# Patient Record
Sex: Male | Born: 1945 | Race: White | Hispanic: No | Marital: Married | Smoking: Former smoker
Health system: Southern US, Community
[De-identification: ages and names within clinical notes are randomized; demographics above are authoritative.]

## PROBLEM LIST (undated history)

## (undated) DIAGNOSIS — Z860100 Personal history of colon polyps, unspecified: Secondary | ICD-10-CM

## (undated) DIAGNOSIS — E7800 Pure hypercholesterolemia, unspecified: Secondary | ICD-10-CM

## (undated) DIAGNOSIS — I739 Peripheral vascular disease, unspecified: Secondary | ICD-10-CM

## (undated) DIAGNOSIS — K589 Irritable bowel syndrome without diarrhea: Secondary | ICD-10-CM

## (undated) DIAGNOSIS — G4733 Obstructive sleep apnea (adult) (pediatric): Secondary | ICD-10-CM

## (undated) DIAGNOSIS — W57XXXA Bitten or stung by nonvenomous insect and other nonvenomous arthropods, initial encounter: Secondary | ICD-10-CM

## (undated) DIAGNOSIS — J45909 Unspecified asthma, uncomplicated: Secondary | ICD-10-CM

## (undated) DIAGNOSIS — E119 Type 2 diabetes mellitus without complications: Secondary | ICD-10-CM

## (undated) DIAGNOSIS — I10 Essential (primary) hypertension: Secondary | ICD-10-CM

## (undated) DIAGNOSIS — N400 Enlarged prostate without lower urinary tract symptoms: Secondary | ICD-10-CM

## (undated) DIAGNOSIS — H9190 Unspecified hearing loss, unspecified ear: Secondary | ICD-10-CM

## (undated) DIAGNOSIS — Z125 Encounter for screening for malignant neoplasm of prostate: Secondary | ICD-10-CM

## (undated) DIAGNOSIS — K219 Gastro-esophageal reflux disease without esophagitis: Secondary | ICD-10-CM

## (undated) DIAGNOSIS — G459 Transient cerebral ischemic attack, unspecified: Secondary | ICD-10-CM

## (undated) DIAGNOSIS — R7303 Prediabetes: Secondary | ICD-10-CM

## (undated) DIAGNOSIS — Z9989 Dependence on other enabling machines and devices: Secondary | ICD-10-CM

## (undated) DIAGNOSIS — N182 Chronic kidney disease, stage 2 (mild): Secondary | ICD-10-CM

## (undated) DIAGNOSIS — I2510 Atherosclerotic heart disease of native coronary artery without angina pectoris: Secondary | ICD-10-CM

## (undated) DIAGNOSIS — D751 Secondary polycythemia: Secondary | ICD-10-CM

## (undated) DIAGNOSIS — I4821 Permanent atrial fibrillation: Secondary | ICD-10-CM

## (undated) DIAGNOSIS — M419 Scoliosis, unspecified: Secondary | ICD-10-CM

## (undated) DIAGNOSIS — E349 Endocrine disorder, unspecified: Secondary | ICD-10-CM

## (undated) DIAGNOSIS — H269 Unspecified cataract: Secondary | ICD-10-CM

## (undated) HISTORY — DX: Unspecified hearing loss, unspecified ear: H91.90

## (undated) HISTORY — DX: Dependence on other enabling machines and devices: Z99.89

## (undated) HISTORY — PX: SKIN CANCER EXCISION: SHX779

## (undated) HISTORY — DX: Unspecified asthma, uncomplicated: J45.909

## (undated) HISTORY — DX: Personal history of colonic polyps: Z86.010

## (undated) HISTORY — PX: COLONOSCOPY W/ POLYPECTOMY: SHX1380

## (undated) HISTORY — DX: Obstructive sleep apnea (adult) (pediatric): G47.33

## (undated) HISTORY — DX: Personal history of colon polyps, unspecified: Z86.0100

## (undated) HISTORY — DX: Prediabetes: R73.03

## (undated) HISTORY — DX: Chronic kidney disease, stage 2 (mild): N18.2

## (undated) HISTORY — DX: Type 2 diabetes mellitus without complications: E11.9

## (undated) HISTORY — DX: Scoliosis, unspecified: M41.9

## (undated) HISTORY — DX: Pure hypercholesterolemia, unspecified: E78.00

## (undated) HISTORY — DX: Irritable bowel syndrome, unspecified: K58.9

## (undated) HISTORY — DX: Transient cerebral ischemic attack, unspecified: G45.9

## (undated) HISTORY — PX: CARDIAC CATHETERIZATION: SHX172

## (undated) HISTORY — PX: NASAL POLYP SURGERY: SHX186

## (undated) HISTORY — DX: Secondary polycythemia: D75.1

## (undated) HISTORY — PX: CIRCUMCISION: SUR203

## (undated) HISTORY — DX: Endocrine disorder, unspecified: E34.9

## (undated) HISTORY — DX: Benign prostatic hyperplasia without lower urinary tract symptoms: N40.0

## (undated) HISTORY — DX: Atherosclerotic heart disease of native coronary artery without angina pectoris: I25.10

## (undated) HISTORY — DX: Unspecified cataract: H26.9

## (undated) HISTORY — DX: Gastro-esophageal reflux disease without esophagitis: K21.9

## (undated) HISTORY — DX: Encounter for screening for malignant neoplasm of prostate: Z12.5

## (undated) HISTORY — DX: Permanent atrial fibrillation: I48.21

## (undated) HISTORY — DX: Bitten or stung by nonvenomous insect and other nonvenomous arthropods, initial encounter: W57.XXXA

## (undated) HISTORY — DX: Essential (primary) hypertension: I10

## (undated) HISTORY — DX: Peripheral vascular disease, unspecified: I73.9

---

## 1981-08-28 HISTORY — PX: VASECTOMY: SHX75

## 1999-08-20 ENCOUNTER — Inpatient Hospital Stay (HOSPITAL_COMMUNITY): Admission: EM | Admit: 1999-08-20 | Discharge: 1999-08-23 | Payer: Self-pay | Admitting: Emergency Medicine

## 1999-08-20 ENCOUNTER — Encounter: Payer: Self-pay | Admitting: Emergency Medicine

## 1999-08-22 ENCOUNTER — Encounter: Payer: Self-pay | Admitting: Cardiology

## 1999-08-29 DIAGNOSIS — I4821 Permanent atrial fibrillation: Secondary | ICD-10-CM

## 1999-08-29 HISTORY — DX: Permanent atrial fibrillation: I48.21

## 2004-10-18 ENCOUNTER — Ambulatory Visit: Payer: Self-pay

## 2004-10-19 ENCOUNTER — Ambulatory Visit: Payer: Self-pay | Admitting: Pulmonary Disease

## 2004-10-28 ENCOUNTER — Ambulatory Visit: Payer: Self-pay | Admitting: Cardiology

## 2004-11-02 ENCOUNTER — Ambulatory Visit: Payer: Self-pay

## 2004-11-03 ENCOUNTER — Ambulatory Visit: Payer: Self-pay | Admitting: Pulmonary Disease

## 2004-11-03 ENCOUNTER — Ambulatory Visit: Payer: Self-pay | Admitting: *Deleted

## 2004-11-11 ENCOUNTER — Ambulatory Visit: Payer: Self-pay | Admitting: Internal Medicine

## 2004-11-23 ENCOUNTER — Ambulatory Visit: Payer: Self-pay | Admitting: *Deleted

## 2004-12-01 ENCOUNTER — Ambulatory Visit: Payer: Self-pay | Admitting: Internal Medicine

## 2004-12-07 ENCOUNTER — Ambulatory Visit: Payer: Self-pay | Admitting: Cardiology

## 2004-12-14 ENCOUNTER — Ambulatory Visit: Payer: Self-pay | Admitting: Cardiology

## 2004-12-14 ENCOUNTER — Ambulatory Visit: Payer: Self-pay | Admitting: Internal Medicine

## 2005-01-02 ENCOUNTER — Ambulatory Visit: Payer: Self-pay | Admitting: Cardiology

## 2005-01-06 ENCOUNTER — Ambulatory Visit (HOSPITAL_COMMUNITY): Admission: RE | Admit: 2005-01-06 | Discharge: 2005-01-06 | Payer: Self-pay | Admitting: Cardiology

## 2005-01-13 ENCOUNTER — Ambulatory Visit: Payer: Self-pay | Admitting: Cardiovascular Disease

## 2005-10-30 ENCOUNTER — Ambulatory Visit: Payer: Self-pay | Admitting: Pulmonary Disease

## 2006-11-01 ENCOUNTER — Ambulatory Visit: Payer: Self-pay | Admitting: Pulmonary Disease

## 2006-11-01 LAB — CONVERTED CEMR LAB
ALT: 22 units/L (ref 0–40)
AST: 24 units/L (ref 0–37)
Albumin: 4.1 g/dL (ref 3.5–5.2)
Alkaline Phosphatase: 57 units/L (ref 39–117)
BUN: 18 mg/dL (ref 6–23)
Basophils Absolute: 0 10*3/uL (ref 0.0–0.1)
Basophils Relative: 0 % (ref 0.0–1.0)
Bilirubin Urine: NEGATIVE
Bilirubin, Direct: 0.3 mg/dL (ref 0.0–0.3)
CO2: 31 meq/L (ref 19–32)
Calcium: 9.4 mg/dL (ref 8.4–10.5)
Chloride: 105 meq/L (ref 96–112)
Cholesterol: 200 mg/dL (ref 0–200)
Creatinine, Ser: 1.1 mg/dL (ref 0.4–1.5)
Eosinophils Absolute: 0.1 10*3/uL (ref 0.0–0.6)
Eosinophils Relative: 1.5 % (ref 0.0–5.0)
GFR calc Af Amer: 88 mL/min
GFR calc non Af Amer: 72 mL/min
Glucose, Bld: 109 mg/dL — ABNORMAL HIGH (ref 70–99)
HCT: 50.5 % (ref 39.0–52.0)
HDL: 38 mg/dL — ABNORMAL LOW (ref >39.0)
Hemoglobin, Urine: NEGATIVE
Hemoglobin: 17.6 g/dL — ABNORMAL HIGH (ref 13.0–17.0)
Ketones, ur: NEGATIVE mg/dL
LDL Cholesterol: 135 mg/dL — ABNORMAL HIGH (ref 0–99)
Leukocytes, UA: NEGATIVE
Lymphocytes Relative: 31.5 % (ref 12.0–46.0)
MCHC: 34.9 g/dL (ref 30.0–36.0)
MCV: 89 fL (ref 78.0–100.0)
Monocytes Absolute: 0.4 10*3/uL (ref 0.2–0.7)
Monocytes Relative: 7.8 % (ref 3.0–11.0)
Neutro Abs: 3.1 10*3/uL (ref 1.4–7.7)
Neutrophils Relative %: 59.2 % (ref 43.0–77.0)
Nitrite: NEGATIVE
PSA: 1.86 ng/mL (ref 0.10–4.00)
Platelets: 160 10*3/uL (ref 150–400)
Potassium: 4.2 meq/L (ref 3.5–5.1)
RBC: 5.68 M/uL (ref 4.22–5.81)
RDW: 12.8 % (ref 11.5–14.6)
Sodium: 142 meq/L (ref 135–145)
Specific Gravity, Urine: 1.02 (ref 1.000–1.03)
TSH: 0.83 microintl units/mL (ref 0.35–5.50)
Testosterone: 914.32 ng/dL — ABNORMAL HIGH (ref 350.00–890)
Total Bilirubin: 1.6 mg/dL — ABNORMAL HIGH (ref 0.3–1.2)
Total CHOL/HDL Ratio: 5.3
Total Protein, Urine: NEGATIVE mg/dL
Total Protein: 7 g/dL (ref 6.0–8.3)
Triglycerides: 137 mg/dL (ref 0–149)
Urine Glucose: NEGATIVE mg/dL
Urobilinogen, UA: 0.2 (ref 0.0–1.0)
VLDL: 27 mg/dL (ref 0–40)
WBC: 5.2 10*3/uL (ref 4.5–10.5)
pH: 5.5 (ref 5.0–8.0)

## 2007-08-13 ENCOUNTER — Telehealth: Payer: Self-pay | Admitting: Pulmonary Disease

## 2007-08-13 DIAGNOSIS — D126 Benign neoplasm of colon, unspecified: Secondary | ICD-10-CM

## 2007-08-13 DIAGNOSIS — K589 Irritable bowel syndrome without diarrhea: Secondary | ICD-10-CM | POA: Insufficient documentation

## 2007-08-13 DIAGNOSIS — M4120 Other idiopathic scoliosis, site unspecified: Secondary | ICD-10-CM | POA: Insufficient documentation

## 2007-08-13 DIAGNOSIS — Z87898 Personal history of other specified conditions: Secondary | ICD-10-CM

## 2007-08-13 DIAGNOSIS — K219 Gastro-esophageal reflux disease without esophagitis: Secondary | ICD-10-CM | POA: Insufficient documentation

## 2007-08-13 DIAGNOSIS — I739 Peripheral vascular disease, unspecified: Secondary | ICD-10-CM | POA: Insufficient documentation

## 2007-08-13 DIAGNOSIS — E7800 Pure hypercholesterolemia, unspecified: Secondary | ICD-10-CM | POA: Insufficient documentation

## 2007-08-14 ENCOUNTER — Ambulatory Visit: Payer: Self-pay | Admitting: Pulmonary Disease

## 2007-10-17 ENCOUNTER — Telehealth (INDEPENDENT_AMBULATORY_CARE_PROVIDER_SITE_OTHER): Payer: Self-pay | Admitting: *Deleted

## 2007-10-17 ENCOUNTER — Ambulatory Visit: Payer: Self-pay | Admitting: Pulmonary Disease

## 2007-10-17 DIAGNOSIS — J45909 Unspecified asthma, uncomplicated: Secondary | ICD-10-CM | POA: Insufficient documentation

## 2007-10-17 DIAGNOSIS — E291 Testicular hypofunction: Secondary | ICD-10-CM

## 2007-10-18 ENCOUNTER — Telehealth (INDEPENDENT_AMBULATORY_CARE_PROVIDER_SITE_OTHER): Payer: Self-pay | Admitting: *Deleted

## 2007-10-23 ENCOUNTER — Encounter: Payer: Self-pay | Admitting: Pulmonary Disease

## 2007-11-08 ENCOUNTER — Ambulatory Visit: Payer: Self-pay | Admitting: Pulmonary Disease

## 2007-11-09 DIAGNOSIS — I10 Essential (primary) hypertension: Secondary | ICD-10-CM | POA: Insufficient documentation

## 2007-11-09 DIAGNOSIS — I2510 Atherosclerotic heart disease of native coronary artery without angina pectoris: Secondary | ICD-10-CM | POA: Insufficient documentation

## 2007-11-28 ENCOUNTER — Encounter: Payer: Self-pay | Admitting: Pulmonary Disease

## 2007-12-02 ENCOUNTER — Encounter: Payer: Self-pay | Admitting: Pulmonary Disease

## 2008-05-05 ENCOUNTER — Encounter: Payer: Self-pay | Admitting: Pulmonary Disease

## 2008-09-08 ENCOUNTER — Encounter: Payer: Self-pay | Admitting: Pulmonary Disease

## 2008-09-30 ENCOUNTER — Telehealth (INDEPENDENT_AMBULATORY_CARE_PROVIDER_SITE_OTHER): Payer: Self-pay | Admitting: *Deleted

## 2008-10-15 ENCOUNTER — Ambulatory Visit: Payer: Self-pay | Admitting: Pulmonary Disease

## 2008-12-21 ENCOUNTER — Encounter: Payer: Self-pay | Admitting: Pulmonary Disease

## 2009-01-12 ENCOUNTER — Encounter: Payer: Self-pay | Admitting: Pulmonary Disease

## 2009-05-25 ENCOUNTER — Encounter: Payer: Self-pay | Admitting: Pulmonary Disease

## 2009-07-13 ENCOUNTER — Ambulatory Visit: Payer: Self-pay | Admitting: Pulmonary Disease

## 2009-07-25 LAB — CONVERTED CEMR LAB
ALT: 28 units/L (ref 0–53)
AST: 24 units/L (ref 0–37)
Albumin: 4.2 g/dL (ref 3.5–5.2)
Alkaline Phosphatase: 54 units/L (ref 39–117)
BUN: 17 mg/dL (ref 6–23)
Basophils Absolute: 0 10*3/uL (ref 0.0–0.1)
Basophils Relative: 0.6 % (ref 0.0–3.0)
Bilirubin, Direct: 0.2 mg/dL (ref 0.0–0.3)
CO2: 32 meq/L (ref 19–32)
Calcium: 9.3 mg/dL (ref 8.4–10.5)
Chloride: 100 meq/L (ref 96–112)
Cholesterol: 211 mg/dL — ABNORMAL HIGH (ref 0–200)
Creatinine, Ser: 1.1 mg/dL (ref 0.4–1.5)
Direct LDL: 122.8 mg/dL
Eosinophils Absolute: 0.2 10*3/uL (ref 0.0–0.7)
Eosinophils Relative: 2.9 % (ref 0.0–5.0)
GFR calc non Af Amer: 71.64 mL/min (ref >60)
Glucose, Bld: 97 mg/dL (ref 70–99)
HCT: 52 % (ref 39.0–52.0)
HDL: 39.2 mg/dL (ref >39.00)
Hemoglobin: 18.1 g/dL — ABNORMAL HIGH (ref 13.0–17.0)
Iron: 144 ug/dL (ref 42–165)
Lymphocytes Relative: 34 % (ref 12.0–46.0)
Lymphs Abs: 2.3 10*3/uL (ref 0.7–4.0)
MCHC: 34.8 g/dL (ref 30.0–36.0)
MCV: 93.2 fL (ref 78.0–100.0)
Monocytes Absolute: 0.5 10*3/uL (ref 0.1–1.0)
Monocytes Relative: 7.4 % (ref 3.0–12.0)
Neutro Abs: 3.8 10*3/uL (ref 1.4–7.7)
Neutrophils Relative %: 55.1 % (ref 43.0–77.0)
PSA: 2.02 ng/mL (ref 0.10–4.00)
Platelets: 163 10*3/uL (ref 150.0–400.0)
Potassium: 4.2 meq/L (ref 3.5–5.1)
RBC: 5.57 M/uL (ref 4.22–5.81)
RDW: 12.5 % (ref 11.5–14.6)
Saturation Ratios: 42.4 % (ref 20.0–50.0)
Sodium: 141 meq/L (ref 135–145)
TSH: 0.83 microintl units/mL (ref 0.35–5.50)
Testosterone: 712.52 ng/dL (ref 350.00–890.00)
Total Bilirubin: 1.5 mg/dL — ABNORMAL HIGH (ref 0.3–1.2)
Total CHOL/HDL Ratio: 5
Total Protein: 7.3 g/dL (ref 6.0–8.3)
Transferrin: 242.6 mg/dL (ref 212.0–360.0)
Triglycerides: 235 mg/dL — ABNORMAL HIGH (ref 0.0–149.0)
Uric Acid, Serum: 8.3 mg/dL — ABNORMAL HIGH (ref 4.0–7.8)
VLDL: 47 mg/dL — ABNORMAL HIGH (ref 0.0–40.0)
WBC: 6.8 10*3/uL (ref 4.5–10.5)

## 2009-08-24 ENCOUNTER — Telehealth (INDEPENDENT_AMBULATORY_CARE_PROVIDER_SITE_OTHER): Payer: Self-pay | Admitting: *Deleted

## 2009-09-09 ENCOUNTER — Telehealth (INDEPENDENT_AMBULATORY_CARE_PROVIDER_SITE_OTHER): Payer: Self-pay | Admitting: *Deleted

## 2010-01-03 ENCOUNTER — Encounter: Payer: Self-pay | Admitting: Pulmonary Disease

## 2010-03-18 ENCOUNTER — Telehealth: Payer: Self-pay | Admitting: Pulmonary Disease

## 2010-08-11 ENCOUNTER — Ambulatory Visit: Payer: Self-pay | Admitting: Pulmonary Disease

## 2010-08-11 ENCOUNTER — Encounter: Payer: Self-pay | Admitting: Pulmonary Disease

## 2010-08-15 LAB — CONVERTED CEMR LAB
ALT: 23 units/L (ref 0–53)
AST: 26 units/L (ref 0–37)
Albumin: 4.3 g/dL (ref 3.5–5.2)
Alkaline Phosphatase: 56 units/L (ref 39–117)
BUN: 16 mg/dL (ref 6–23)
Basophils Absolute: 0 10*3/uL (ref 0.0–0.1)
CO2: 32 meq/L (ref 19–32)
Chloride: 101 meq/L (ref 96–112)
Cholesterol: 214 mg/dL — ABNORMAL HIGH (ref 0–200)
Direct LDL: 117.5 mg/dL
Glucose, Bld: 97 mg/dL (ref 70–99)
HCT: 52 % (ref 39.0–52.0)
Lymphocytes Relative: 32.3 % (ref 12.0–46.0)
Lymphs Abs: 1.9 10*3/uL (ref 0.7–4.0)
Monocytes Relative: 8.7 % (ref 3.0–12.0)
Neutrophils Relative %: 56.3 % (ref 43.0–77.0)
Platelets: 180 10*3/uL (ref 150.0–400.0)
Potassium: 4.4 meq/L (ref 3.5–5.1)
RDW: 12.9 % (ref 11.5–14.6)
Sodium: 141 meq/L (ref 135–145)
Testosterone: 494.7 ng/dL (ref 350.00–890.00)
Total Protein: 7.2 g/dL (ref 6.0–8.3)
Transferrin: 290.1 mg/dL (ref 212.0–360.0)
Triglycerides: 240 mg/dL — ABNORMAL HIGH (ref 0.0–149.0)
VLDL: 48 mg/dL — ABNORMAL HIGH (ref 0.0–40.0)
WBC: 6 10*3/uL (ref 4.5–10.5)

## 2010-09-25 LAB — CONVERTED CEMR LAB
ALT: 27 units/L (ref 0–53)
AST: 26 units/L (ref 0–37)
Albumin: 4.3 g/dL (ref 3.5–5.2)
Alkaline Phosphatase: 59 units/L (ref 39–117)
BUN: 12 mg/dL (ref 6–23)
Bacteria, UA: NEGATIVE
Basophils Absolute: 0.1 10*3/uL (ref 0.0–0.1)
Basophils Relative: 0.9 % (ref 0.0–1.0)
Bilirubin Urine: NEGATIVE
Bilirubin, Direct: 0.2 mg/dL (ref 0.0–0.3)
CO2: 31 meq/L (ref 19–32)
Calcium: 9.4 mg/dL (ref 8.4–10.5)
Chloride: 105 meq/L (ref 96–112)
Cholesterol: 226 mg/dL (ref 0–200)
Creatinine, Ser: 1.1 mg/dL (ref 0.4–1.5)
Crystals: NEGATIVE
Eosinophils Absolute: 0.1 10*3/uL (ref 0.0–0.6)
Eosinophils Relative: 1.8 % (ref 0.0–5.0)
GFR calc Af Amer: 87 mL/min
GFR calc non Af Amer: 72 mL/min
Glucose, Bld: 115 mg/dL — ABNORMAL HIGH (ref 70–99)
HCT: 54.6 % — ABNORMAL HIGH (ref 39.0–52.0)
HDL: 41.9 mg/dL (ref >39.0)
Hemoglobin, Urine: NEGATIVE
Hemoglobin: 18.5 g/dL — ABNORMAL HIGH (ref 13.0–17.0)
Ketones, ur: NEGATIVE mg/dL
LDL Cholesterol: 157 mg/dL — ABNORMAL HIGH (ref 0–99)
Leukocytes, UA: NEGATIVE
Lymphocytes Relative: 26.4 % (ref 12.0–46.0)
MCHC: 33.8 g/dL (ref 30.0–36.0)
MCV: 90.3 fL (ref 78.0–100.0)
Monocytes Absolute: 0.4 10*3/uL (ref 0.2–0.7)
Monocytes Relative: 7.9 % (ref 3.0–11.0)
Neutro Abs: 3.5 10*3/uL (ref 1.4–7.7)
Neutrophils Relative %: 63 % (ref 43.0–77.0)
Nitrite: NEGATIVE
PSA: 2.24 ng/mL (ref 0.10–4.00)
Platelets: 157 10*3/uL (ref 150–400)
Potassium: 3.9 meq/L (ref 3.5–5.1)
RBC / HPF: NONE SEEN
RBC: 6.05 M/uL — ABNORMAL HIGH (ref 4.22–5.81)
RDW: 12.5 % (ref 11.5–14.6)
Sodium: 142 meq/L (ref 135–145)
Specific Gravity, Urine: 1.025 (ref 1.000–1.03)
Squamous Epithelial / HPF: NEGATIVE /lpf
TSH: 1.22 microintl units/mL (ref 0.35–5.50)
Testosterone: 707.79 ng/dL (ref 350.00–890)
Total Bilirubin: 1.9 mg/dL — ABNORMAL HIGH (ref 0.3–1.2)
Total CHOL/HDL Ratio: 5.4
Total Protein, Urine: NEGATIVE mg/dL
Total Protein: 7.3 g/dL (ref 6.0–8.3)
Triglycerides: 134 mg/dL (ref 0–149)
Urine Glucose: NEGATIVE mg/dL
Urobilinogen, UA: 0.2 (ref 0.0–1.0)
VLDL: 27 mg/dL (ref 0–40)
WBC: 5.6 10*3/uL (ref 4.5–10.5)
pH: 5 (ref 5.0–8.0)

## 2010-09-27 NOTE — Progress Notes (Signed)
Summary: androgel directions clarification - hold for SN's return  Phone Note Call from Patient   Caller: Patient Call For: Wolf Boulay Summary of Call: pt need 3 month supply for androl gel [facility] [phone] Initial call taken by: Rickard Patience,  March 18, 2010 10:09 AM  Follow-up for Phone Call        Spoke with pharmacist- pt just picked up 30 day supply of this on 03/17/10- pt needs appt. LMOMTCB Vernie Murders  March 18, 2010 10:27 AM  pt returned call.  pt states that he normally gets a 90day supply but only got 30days when he picked up rx yesterday.  informed pt that since he just picked it up yesterday, insurance wouldn't cover another supply so soon.  but will call pharmacy to get the dosing for 90days so that the rx can be changed for next time.  pt okay with this and verbalized his understanding.  called spoke with pharmacist at [facility].  she states that pt has been using the androgel 5grams daily provided in 5gm packets (which per pharmacist equals to 4 pumps).  per pt's chart, this was changed to 2-3pumps daily 06/2009.  will hold msg until SN returns to office for clarification on directions and for okay to send new rx for 90days. Boone Master CNA/MA  March 18, 2010 11:17 AM    Additional Follow-up for Phone Call Additional follow up Details #1::        per SN---last level was 700 06/2009 on the 5 pumps 3-4 days weekly---rec changing to 3 pumps daily but we are not sure if he changed the dosing.  this has been called into the pharmacy for the pt for 90 day supply per pts request.  lmovm to make pt aware Randell Loop CMA  March 28, 2010 9:58 AM      New/Updated Medications: ANDROGEL PUMP 1 %  GEL (TESTOSTERONE) apply 3 pumps to skin daily as directed.Marland KitchenMarland Kitchen

## 2010-09-27 NOTE — Letter (Signed)
Summary: [facility]   Imported By: Sherian Rein 01/25/2010 13:46:57  _____________________________________________________________________  External Attachment:    Type:   Image     Comment:   External Document

## 2010-09-27 NOTE — Progress Notes (Signed)
Summary: needs refill  Phone Note Call from Patient Call back at [phone]   Caller: Patient Call For: nadel Reason for Call: Refill Medication Summary of Call: [facility] prescription to [place] on the 5th and they havent heard anything yet. it is a refill for androgel.  phone [phone] Initial call taken by: Valinda Hoar,  September 09, 2009 12:28 PM  Follow-up for Phone Call        refills called to pharmacy--lm for pt this had been done Follow-up by: Philipp Deputy CMA,  September 09, 2009 12:40 PM    Prescriptions: ANDROGEL PUMP 1 %  GEL (TESTOSTERONE) apply 2-3 pumps to skin daily as directed...  #1 x 6   Entered by:   Philipp Deputy CMA   Authorized by:   Michele Mcalpine MD   Signed by:   Philipp Deputy CMA on 09/09/2009   Method used:   Telephoned to ...       [facility]* (retail)       [phone] [address] [place], [place]  [address]       Ph: [phone]       Fax: [phone]   RxID:   [id number]

## 2010-09-29 NOTE — Assessment & Plan Note (Signed)
Summary: 1 YR PHYSICAL ///KP   CC:  Yearly ROV & review of mult medical problems....  History of Present Illness: 65 y/o WM here for a yearly CPX... he has multiple medical problems as noted below...     ~  August 11, 2010:  he reports a good year- no new complaints or concerns (see below)...   Current Problems:   REACTIVE AIRWAY DISEASE (ICD-493.90) - he stopped the Advair & denies any URIs or exac over the last year...  ~  CXR 11/10 showed borderline heart size, clear lungs, NAD.Marland Kitchen.  ~  CXR 12/11 showed borderline cor, NAD...  HYPERTENSION (ICD-401.9) -  followed by DrSimmons at [facility]- on COUMADIN (followed in their clinic), CARTIA XT 180mg Bid, ZIAC 5mg /d, MICARDIS 40mg Qd... last note 5/11 reviewed- stable, needs to lose wt... BP today is 140/80, & weight about the same at 199#... he has not been dieting adeq, or exercising by his admission & we discussed this.  CORONARY ARTERY DISEASE (ICD-414.00) - known non-obstructive CAD on cath 12/00 showing 2 vessel 30% lesions, no signif blockage, EF=60%... rec for med rx, risk factor reduction, diet, exerc...  ~  Stress Test 4/09 at [facility] showed good exerc capacity, normal EF   ~  12/11: denies CP, angina, palpit, SOB, edema... rec to incr exercise, get wt down.  ATRIAL FIBRILLATION (ICD-V12.59) - attempted cardioversion unsuccessful in 2008... on Coumadin followed at [facility]...  ~  EKG 11/10 showed AFib, rate 62/min, NSSTTWA...  ~  EKG 12/11 showed AFib controlled VR, NSSTTWA...  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 now followed by [facility]mons [facility].  HYPERCHOLESTEROLEMIA (ICD-272.0) - intol to Lipitor... currently on CRESTOR 5mg - taking 1/2 tab 3d/wk per [facility].  ~ FLP 3/08 showed TChol 200, TG 137, HDL 38, LDL 135... never tried other statins...  ~ FLP 3/09 showed TChol 226, TG 134, HDL 42, LDL 157... we will start CRESTOR 5mg /d + CoQ 10.  ~  4/09: pt started  seeing the Lipid Clinic at [facility] for labs & meds... he can only tol 2.5mg  Crestor...  ~  FLP 11/10 (on Cres5- 1/2tab) showed TChol 211, TG 235, HDL 39, LDL 123... copy to [facility].  ~  FLP 12/11 on Cres5- 1/2MWF showed TChol 214, TG 240, HDL 44, LDL 118... similar to prev & not at goal.  GERD (ICD-530.81) - on PROTONIX 40mg /d... 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   ~  12/11:  DrOutlaw has moved- he will call [facility] GI Dept to be reassigned & have f/u colon in 2012.  BENIGN PROSTATIC HYPERTROPHY, HX OF (ICD-V13.8) - DRE & PSAs have been normal... 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 on ANDROGEL 5gm/d w/ improvement, and f/u blood levels 300-500 range...   ~  testosterone level = 914 on 3/08 >> instructed to decrease the Androgel to Qod... PSA= 1.86.  ~  f/u labs 3/09 showed level 708 on Qod Androgel... PSA is 2.24  ~  changed to Androgel pump- taking 5 pumps 3-4 days per week...   ~  labs 11/10 showed Testos= 712, PSA= 2.02... rec to change to 2-3 pumps per day.  ~  12/11:  now on Androgel 4pumps Qod & Testos level= 494  SCOLIOSIS (ICD-737.30)  ? of POLYCYTHEMIA (ICD-289.0) - he's had borderline high Hgb/HCT in the past w/ elevated  serum iron levels (293 in 2002)... he was negative for any hemochromatosis genetic markers (C282Y, H63D, S65C)... told to avoid iron supplements and consider blood donation regularly (he has Bneg blood but can't donate now on Coumadin)... his iron level improved to 164 w/ this strategy...  ~  labs 11/10 showed Hg= 18.1, HCT= 52, Fe= 144  ~  labs 12/11 showed Hg= 18.3, Hct= 52, Fe= 118 (29%sat)...  DERMATOLOGY - he is followed at the [facility] & seen every 28mo...   ~  Hx skin ca left shoulder 2008, "they got it all" he says, we don't have records.  ~  12/11:  he reports having  mult skin lesions removed10/11 & Rx w/ CARAC Cream.  HEALTH MAINTENANCE:  ~  GI:  followed by [facility] GI dept> DrOutlaw, last colon 8/07 w/ adenomatous polyp removed.  ~  GU:  Hx BPH & Loe-T on Androgel 4 pumps Qod w/ level = 495.  ~  Immunizations:  he gets the yearly seasonal Flu vaccine... given PNEUMOVAX 12/11, age 75... ?when last Tetanus shot was given... we discussed indication for the Shingles vaccine.   Preventive Screening-Counseling & Management  Alcohol-Tobacco     Smoking Status: quit     Year Quit: 1977     Pack years: 5  Allergies: 1)  ! Lipitor (Atorvastatin Calcium)  Comments:  Nurse/Medical Assistant: The patient's medications and allergies were reviewed with the patient and were updated in the Medication and Allergy Lists.  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 of POLYCYTHEMIA (ICD-289.0)  Past Surgical History: S/P nasal polpy surgery S/P circumcision  Family History: Reviewed history from 07/13/2009 and no changes required. mother deceased age 31 from aneurysm father deceased age 75 from urinic poisoning  hx of DM 1 sibling alive age 21 1 sibling deceased age 51 from liver problems 1 siblign alive age 31 1 sibling alive age 27  Social History: Reviewed history from 07/13/2009 and no changes required. married 2 children quit smoking in 1976--cigars--smoked for 34 years exposed to second hand smoke exercises 2 times weekly caffeine use  2 cups daily no alcohol use quit driniking in 1968 retired  Review of Systems      See HPI  The patient  denies anorexia, fever, weight loss, weight gain, vision loss, decreased hearing, hoarseness, chest pain, syncope, dyspnea on exertion, peripheral edema, prolonged cough, headaches, hemoptysis, abdominal pain, melena, hematochezia, severe indigestion/heartburn, hematuria, incontinence, muscle weakness, suspicious skin lesions, transient blindness, difficulty walking, depression, unusual weight change, abnormal bleeding, enlarged lymph nodes, and angioedema.    Vital Signs:  Patient profile:   65 year old male Height:      66 inches Weight:      198.50 pounds BMI:     32.15 O2 Sat:      96 % on Room air Temp:     97.3 degrees F oral Pulse rate:   89 / minute BP sitting:   140 / 80  (right arm) Cuff size:   regular  Vitals Entered By: Randell Loop CMA (August 11, 2010 8:50 AM)  O2 Sat at Rest %:  96 O2 Flow:  Room air CC: Yearly ROV & review of mult medical problems...   Physical Exam  Additional Exam:  WD,  WN, 65 y/o WM in NAD... GENERAL:  Alert & oriented; pleasant & cooperative... HEENT:  [place]/AT, EOM-wnl, PERRLA, EACs-clear, TMs-wnl, NOSE-clear, THROAT-clear & wnl. NECK:  Supple w/ fairROM; no JVD; normal carotid impulses w/o bruits; no thyromegaly or nodules palpated; no lymphadenopathy. CHEST:  Clear to P & A; without wheezes/ rales/ or rhonchi heard... HEART:  sl irreg, without murmurs/ rubs/ or gallops heard... ABDOMEN:  Soft & nontender; normal bowel sounds; no organomegaly or masses detected. RECTAL:  sl enlarged prostate- smooth w/o nodules, no masses, stool heme neg... EXT: without deformities or arthritic changes; no varicose veins/ venous insuffic/ or edema. NEURO:  CN's intact; no focal neuro deficits... DERM:  Scar left shoulder, no other lesions noted; no rash noted...    MISC. Report  Procedure date:  08/11/2010  Findings:      DATA REVIEWED:  ~  CXR, EKG, Fasting blood work all done today & reviewed w/ pt via the Phone Tree...   Impression &  Recommendations:  Problem # 1:  HYPERTENSION (ICD-401.9) BP controlled>  continue same meds... His updated medication list for this problem includes:    Ziac 5-6.25 Mg Tabs (Bisoprolol-hydrochlorothiazide) .Marland Kitchen... 1 tab daily...    Cardizem Cd 180 Mg Cp24 (Diltiazem hcl coated beads) .Marland Kitchen... Take one tablet two times a day    Micardis 40 Mg Tabs (Telmisartan) .Marland Kitchen... Take 1 tablet by mouth once a day  Orders: T-2 View CXR (71020TC) TLB-BMP (Basic Metabolic Panel-BMET) (80048-METABOL) TLB-Hepatic/Liver Function Pnl (80076-HEPATIC) TLB-CBC Platelet - w/Differential (85025-CBCD) TLB-Lipid Panel (80061-LIPID) TLB-TSH (Thyroid Stimulating Hormone) (84443-TSH) TLB-IBC Pnl (Iron/FE;Transferrin) (83550-IBC) TLB-PSA (Prostate Specific Antigen) (84153-PSA) TLB-Testosterone, Total (84403-TESTO)  Problem # 2:  ATRIAL FIBRILLATION, HX OF (ICD-V12.59) Cardiac followd by [facility]- DrSimmons Q81mo & his notes reviewed...  Problem # 3:  HYPERCHOLESTEROLEMIA (ICD-272.0) Followed by the [facility] on Cres5 but only tol 1/2 MWF... asked to try and incr to 4d/wk... copy sent to [facility]... His updated medication list for this problem includes:    Crestor 5 Mg Tabs (Rosuvastatin calcium) .Marland Kitchen... Take as directed...  Problem # 4:  COLONIC POLYPS (ICD-211.3) GI stable but he is due for a follow up w/ DrOutlaw & he will call to set this up himself...  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 continue same...  Problem # 6:  ? of POLYCYTHEMIA (ICD-289.0) He has mild polycythemia but Fe level is normal etc... can't donate blood (he is B neg) due to coumadin Rx...  Problem # 7:  OTHER MEDICAL ISSUES AS NOTED>>> OK Pneumonia vaccine today...  Complete Medication List: 1)  Coumadin 5 Mg Tabs (Warfarin sodium) .... Take as directed... 2)  Ziac 5-6.25 Mg Tabs (Bisoprolol-hydrochlorothiazide) .Marland Kitchen.. 1 tab daily.Marland KitchenMarland Kitchen 3)  Cardizem Cd 180 Mg Cp24 (Diltiazem hcl coated  beads) .... Take one tablet two times a day 4)  Micardis 40 Mg Tabs (Telmisartan) .... Take 1 tablet by mouth once a day 5)  Crestor 5 Mg Tabs (Rosuvastatin calcium) .... Take as directed... 6)  Protonix 40 Mg Pack (Pantoprazole sodium) .... Take 1 tablet by mouth once a day 7)  Androgel Pump 1 % Gel (Testosterone) .... Apply 3 pumps to skin daily as directed... 8)  Calcium 500 Mg Tabs (Calcium) .... Take one tablet by mouth two times a day 9)  Max Glucosamine Chondroitin 500-400 Mg Tabs (Glucosamine-chondroitin) .... Take 1 tablet by mouth once a day 10)  Mens Multivitamin Plus Tabs (Multiple vitamins-minerals) .... Take 1 tablet  by mouth once a day  Other Orders: EKG w/ Interpretation (93000) Pneumococcal Vaccine (04540) Admin 1st Vaccine (98119) Flu Vaccine 63yrs + (14782) Admin of Any Addtl Vaccine (95621)  Patient Instructions: 1)  Today we updated your med list- see below.... 2)  We refilled your meds as discussed... 3)  Today we did your follow up CXR, EKG, & FASTING blood work... 4)  please call the "phone tree" in a few days for your lab results.Marland KitchenMarland Kitchen  5)  We also gave you the 2011 Flu shot & a PNEUMONIA vaccine (based on current CDC recommendations this is the only Pneumovax you will need)... 6)  Let's get on track w/ our diet & exercise program... 7)  Call for any problems... 8)  Please schedule a follow-up appointment in 1 year. Prescriptions: ANDROGEL PUMP 1 %  GEL (TESTOSTERONE) apply 3 pumps to skin daily as directed...  #3 mo supply x 4   Entered and Authorized by:   Michele Mcalpine MD   Signed by:   Michele Mcalpine MD on 08/11/2010   Method used:   Print then Give to Patient   RxID:   [id number] PROTONIX 40 MG  PACK (PANTOPRAZOLE SODIUM) Take 1 tablet by mouth once a day  #90 x 4   Entered and Authorized by:   Michele Mcalpine MD   Signed by:   Michele Mcalpine MD on 08/11/2010   Method used:   Print then Give to Patient   RxID:   [id number] CRESTOR 5 MG  TABS  (ROSUVASTATIN CALCIUM) take as directed...  #90 x 4   Entered and Authorized by:   Michele Mcalpine MD   Signed by:   Michele Mcalpine MD on 08/11/2010   Method used:   Print then Give to Patient   RxID:   [id number] MICARDIS 40 MG TABS (TELMISARTAN) Take 1 tablet by mouth once a day  #90 x 4   Entered and Authorized by:   Michele Mcalpine MD   Signed by:   Michele Mcalpine MD on 08/11/2010   Method used:   Print then Give to Patient   RxID:   [id number] CARDIZEM CD 180 MG  CP24 (DILTIAZEM HCL COATED BEADS) take one tablet two times a day  #180 x 4   Entered and Authorized by:   Michele Mcalpine MD   Signed by:   Michele Mcalpine MD on 08/11/2010   Method used:   Print then Give to Patient   RxID:   [id number] ZIAC 5-6.25 MG  TABS (BISOPROLOL-HYDROCHLOROTHIAZIDE) 1 tab daily...  #90 x 4   Entered and Authorized by:   Michele Mcalpine MD   Signed by:   Michele Mcalpine MD on 08/11/2010   Method used:   Print then Give to Patient   RxID:   [id number] COUMADIN 5 MG  TABS (WARFARIN SODIUM) Take as directed...  #90 x 4   Entered and Authorized by:   Michele Mcalpine MD   Signed by:   Michele Mcalpine MD on 08/11/2010   Method used:   Print then Give to Patient   RxID:   [id number]    Immunizations Administered:  Pneumonia Vaccine:    Vaccine Type: Pneumovax    Site: right deltoid    Mfr: Merck    Dose: 0.5 ml    Route: IM    Given by: Randell Loop CMA    Exp. Date: 01/05/2012    Lot #: [id number]  VIS given: 08/02/09 version given August 11, 2010.  Influenza Vaccine # 1:    Vaccine Type: Fluvax 3+    Site: left deltoid    Mfr: GlaxoSmithKline    Dose: 0.5 ml    Route: IM    Given by: Randell Loop CMA    Exp. Date: 02/25/2011    Lot #: [id number]    VIS given: 03/22/10 version given August 11, 2010.  Flu Vaccine Consent Questions:    Do you have a history of severe allergic reactions to this vaccine? no    Any prior history of allergic reactions to egg and/or gelatin?  no    Do you have a sensitivity to the preservative Thimersol? no    Do you have a past history of Guillan-Barre Syndrome? no    Do you currently have an acute febrile illness? no    Have you ever had a severe reaction to latex? no    Vaccine information given and explained to patient? yes

## 2011-01-13 NOTE — H&P (Signed)
[facility]  Patient:    Jeffrey Little                       MRN: [id number] Adm. Date:  [id number] Attending:  Corwin Levins CC:         Lonzo Cloud. Kriste Basque, M.D. [facility]                         History and Physical  CHIEF COMPLAINT:  Fluttering of the chest.  HISTORY OF PRESENT ILLNESS:  [name] is a 65 year old white male, who awakened this morning with a fluttering of the chest and slight "uneasiness" on lying flat. Denies any chest pain, shortness of breath, or syncope, or other symptoms.  No apparent history of heart problems.  The wife is a former cardiac nurse at [employer].  She brought him to the emergency room where electrocardiogram showed atrial fibrillation, with heart rates in the 80-90 range.  He is now admitted with new onset atrial fibrillation of less than 24 hours by history.  He incidentally had a stress test and an echocardiogram 10 years ago for benign PVCs with Dr. Arturo Morton. Stuckey.  PAST MEDICAL HISTORY: 1. Hypertension. 2. History of benign PVCs. 3. Allergies. 4. Benign prostatic hypertrophy. 5. Borderline polycythemia.  ALLERGIES:  No known drug allergies.  CURRENT MEDICATIONS: 1. Cardura 4 mg p.o. q.d. 2. Cardizem CD 240 mg p.o. q.d. 3. Ziac unclear dose, but probably 5/6.25 mg p.o. q.d. 4. Baby aspirin 81 mg p.o. q.d. 5. Multivitamin q.d.  SOCIAL HISTORY:  No tobacco, no alcohol.  FAMILY HISTORY:  No heart disease.  REVIEW OF SYSTEMS:  Otherwise noncontributory.  PHYSICAL EXAMINATION:  GENERAL:  [name] is a 65 year old white male who appears comfortable.  VITAL SIGNS:  Blood pressure 133/89, heart rate 93, respirations 16, afebrile.  HEENT:  Sclerae clear.  Tympanic membranes clear.  Pharynx benign.  NECK:  No lymphadenopathy, no jugular venous distention, thyromegaly.  CHEST:  No rales or wheezing.  CARDIAC:  Irregularly irregular, no murmur.  ABDOMEN:  Soft, nontender.  Positive  bowel sounds.  EXTREMITIES:  No edema.  NEUROLOGIC:  Cranial nerves II-XII intact, otherwise nonfocal.  LABORATORY DATA:  Electrocardiogram:  Atrial fibrillation, heart rate 84, no acute changes.  Chest x-ray:  No infiltrate or edema.  CBC and CMET pending at the time of this dictation.  CPK 79, MB negative, troponin I pending.  ASSESSMENT:  New onset atrial fibrillation on p.o. Cardizem, less than 24 hours  rate controlled.  No evidence of congestive heart failure.  No good history for  ischemia.  PLAN:  He will be admitted and placed on telemetry. Check CPK, MB fractions, troponin I, and TSH.  Follow up CBC and CMET already drawn.  Will check serial electrocardiograms.  Continue baby aspirin q.d.  Start Lovenox subcutaneously for anticoagulation and a cardiology consultation.  Questionable cardioversion. With a history of atrial fibrillation of less than 24 hours, may be a candidate for a cardioversion, though this would be extremely difficult on a Saturday, without he ability to obtain a 2-D echocardiogram urgently.  Dr. Arturo Morton. Stuckey consulted. DD:  08/20/99 TD:  08/20/99 Job: [id number]

## 2011-01-13 NOTE — Op Note (Signed)
NAMEMELCHIZEDEK, ESPINOLA NO.:  [id number]   MEDICAL RECORD NO.:  [id number]          PATIENT TYPE:  OIB   LOCATION:  2899                         FACILITY:  [facility]   PHYSICIAN:  Willa Rough, M.D.     DATE OF BIRTH:  03/27/1946   DATE OF PROCEDURE:  01/06/2005  DATE OF DISCHARGE:                                 OPERATIVE REPORT   PROCEDURE:  Cardioversion.   The patient has had atrial fibrillation and today was a planned  cardioversion carefully arranged by Dr. Riley Kill.  Anesthesia was present.  For the entire procedure, the patient received 375 mg IV Pentothal.  We  started with anterior posterior pads.  With 100 joules on the biphasic  defibrillator, the patient appeared to have 2-3 beats of sinus rhythm  followed by return to atrial fib.  Then, 200 joules was used in the anterior  posterior position and the patient remained in atrial fib.  The pads were  changed to anterior anterior.  200 joules of energy was delivered and the  patient remained in atrial fib.  We then compressed his chest to end  expiration and tried 200 joules on another two occasions and the patient  remained in atrial fib.  It was felt that further cardioversion attempts  should not be done at this time.  The patient did not convert to sinus  rhythm.  The patient did well and woke up without difficulty.  His wife was  present and she understands the procedure completely and knows that he did  not convert.      JK/MEDQ  D:  01/06/2005  T:  01/06/2005  Job:  [id number]   cc:   Arturo Morton. Riley Kill, M.D. [facility]

## 2011-03-07 ENCOUNTER — Telehealth: Payer: Self-pay | Admitting: Pulmonary Disease

## 2011-03-07 DIAGNOSIS — D126 Benign neoplasm of colon, unspecified: Secondary | ICD-10-CM

## 2011-03-07 NOTE — Telephone Encounter (Signed)
lmomtcb  

## 2011-03-07 NOTE — Telephone Encounter (Signed)
PATIENT DID NOT ESTABLISH WITH ANY OTHER GI.

## 2011-03-07 NOTE — Telephone Encounter (Signed)
lmomtcb x 1 Need to know if pt ever became established with a new GI since Dr. Dulce Sellar moved, also to confirm if pt really needs an order.

## 2011-03-07 NOTE — Telephone Encounter (Signed)
Patient calling back.  Cell#[phone]

## 2011-03-07 NOTE — Telephone Encounter (Signed)
Spoke with the pt and he states that his GI doctor Dr. Dulce Sellar left the practice that he was at and he has not established with another GI doctor.  He states that his insurance requires him to have a referral from D.r Kriste Basque. Per last OV note Sn states that pt needs repeat colonoscopy in 2012. Order placed. Pt requests it be sent to [facility].  Carron Curie, CMA

## 2011-08-11 ENCOUNTER — Ambulatory Visit: Payer: Self-pay | Admitting: Pulmonary Disease

## 2011-09-15 ENCOUNTER — Encounter: Payer: Self-pay | Admitting: Pulmonary Disease

## 2011-09-15 ENCOUNTER — Other Ambulatory Visit (INDEPENDENT_AMBULATORY_CARE_PROVIDER_SITE_OTHER): Payer: PRIVATE HEALTH INSURANCE

## 2011-09-15 ENCOUNTER — Ambulatory Visit (INDEPENDENT_AMBULATORY_CARE_PROVIDER_SITE_OTHER): Payer: PRIVATE HEALTH INSURANCE | Admitting: Pulmonary Disease

## 2011-09-15 ENCOUNTER — Ambulatory Visit (INDEPENDENT_AMBULATORY_CARE_PROVIDER_SITE_OTHER)
Admission: RE | Admit: 2011-09-15 | Discharge: 2011-09-15 | Disposition: A | Discharge status: Home / Self Care | Payer: PRIVATE HEALTH INSURANCE | Source: Ambulatory Visit | Attending: Pulmonary Disease | Admitting: Pulmonary Disease

## 2011-09-15 DIAGNOSIS — K589 Irritable bowel syndrome without diarrhea: Secondary | ICD-10-CM

## 2011-09-15 DIAGNOSIS — Z87898 Personal history of other specified conditions: Secondary | ICD-10-CM

## 2011-09-15 DIAGNOSIS — I10 Essential (primary) hypertension: Secondary | ICD-10-CM

## 2011-09-15 DIAGNOSIS — E7800 Pure hypercholesterolemia, unspecified: Secondary | ICD-10-CM

## 2011-09-15 DIAGNOSIS — Z23 Encounter for immunization: Secondary | ICD-10-CM

## 2011-09-15 DIAGNOSIS — K219 Gastro-esophageal reflux disease without esophagitis: Secondary | ICD-10-CM

## 2011-09-15 DIAGNOSIS — J45909 Unspecified asthma, uncomplicated: Secondary | ICD-10-CM

## 2011-09-15 DIAGNOSIS — D126 Benign neoplasm of colon, unspecified: Secondary | ICD-10-CM

## 2011-09-15 DIAGNOSIS — M4120 Other idiopathic scoliosis, site unspecified: Secondary | ICD-10-CM

## 2011-09-15 DIAGNOSIS — E291 Testicular hypofunction: Secondary | ICD-10-CM

## 2011-09-15 DIAGNOSIS — I2510 Atherosclerotic heart disease of native coronary artery without angina pectoris: Secondary | ICD-10-CM

## 2011-09-15 DIAGNOSIS — I739 Peripheral vascular disease, unspecified: Secondary | ICD-10-CM

## 2011-09-15 DIAGNOSIS — I4891 Unspecified atrial fibrillation: Secondary | ICD-10-CM

## 2011-09-15 LAB — CBC WITH DIFFERENTIAL/PLATELET
Basophils Absolute: 0.1 10*3/uL (ref 0.0–0.1)
Eosinophils Absolute: 0.1 10*3/uL (ref 0.0–0.7)
HCT: 52.3 % — ABNORMAL HIGH (ref 39.0–52.0)
Hemoglobin: 17.9 g/dL — ABNORMAL HIGH (ref 13.0–17.0)
Lymphocytes Relative: 34.3 % (ref 12.0–46.0)
Lymphs Abs: 2.1 10*3/uL (ref 0.7–4.0)
MCHC: 34.1 g/dL (ref 30.0–36.0)
Monocytes Absolute: 0.6 10*3/uL (ref 0.1–1.0)
Neutro Abs: 3.2 10*3/uL (ref 1.4–7.7)
RDW: 13.2 % (ref 11.5–14.6)

## 2011-09-15 LAB — LIPID PANEL
Cholesterol: 268 mg/dL — ABNORMAL HIGH (ref 0–200)
HDL: 43.7 mg/dL (ref >39.00)
Total CHOL/HDL Ratio: 6
Triglycerides: 229 mg/dL — ABNORMAL HIGH (ref 0.0–149.0)

## 2011-09-15 LAB — HEPATIC FUNCTION PANEL
Alkaline Phosphatase: 62 U/L (ref 39–117)
Bilirubin, Direct: 0.2 mg/dL (ref 0.0–0.3)
Total Protein: 7.2 g/dL (ref 6.0–8.3)

## 2011-09-15 LAB — BASIC METABOLIC PANEL
Calcium: 9.3 mg/dL (ref 8.4–10.5)
Creatinine, Ser: 1.3 mg/dL (ref 0.4–1.5)
GFR: 59.74 mL/min — ABNORMAL LOW (ref >60.00)
Sodium: 140 mEq/L (ref 135–145)

## 2011-09-15 LAB — LDL CHOLESTEROL, DIRECT: Direct LDL: 160.4 mg/dL

## 2011-09-15 MED ORDER — DILTIAZEM HCL ER COATED BEADS 180 MG PO CP24: 180.0000 mg | ORAL_CAPSULE | Freq: Two times a day (BID) | ORAL | Status: DC

## 2011-09-15 MED ORDER — TESTOSTERONE 12.5 MG/ACT (1%) TD GEL: 3.0000 "application " | Freq: Every day | TRANSDERMAL | Status: DC

## 2011-09-15 MED ORDER — TELMISARTAN 40 MG PO TABS: 40.0000 mg | ORAL_TABLET | Freq: Every day | ORAL | Status: DC

## 2011-09-15 MED ORDER — PANTOPRAZOLE SODIUM 40 MG PO TBEC: 40.0000 mg | DELAYED_RELEASE_TABLET | Freq: Every day | ORAL | Status: DC

## 2011-09-15 MED ORDER — BISOPROLOL-HYDROCHLOROTHIAZIDE 5-6.25 MG PO TABS: 1.0000 | ORAL_TABLET | Freq: Every day | ORAL | Status: DC

## 2011-09-15 NOTE — Patient Instructions (Addendum)
Today we updated your med list in our EPIC system...    Continue your current medications the same...  Today we did your follow up CXR & fasting blood work...    Please call the PHONE TREE in a few days for your results...    Dial [id number] & when prompted enter your patient number followed by the # symbol...    Your patient number is:  [id number]  We gave you the combination TETANUS vaccine today called the TDAP (good for 10 yrs)...  If you aren't going to take a statin med, your only hope of controlling your cholesterol is w/ a low carb, low chol, low fat weight reducing diet!!!  Call for any questions & let me know if we can be of service in any way.Marland KitchenMarland Kitchen

## 2011-09-15 NOTE — Progress Notes (Signed)
Subjective:     Patient ID: Jeffrey Little, male   DOB: 03-01-1946, 66 y.o.   MRN: [id number]  HPI 66 y/o WM here for a yearly follow up exam... he has multiple medical problems as noted below...    ~  September 15, 2011:  he reports a good year- no new complaints or concerns (see below)...     >HBP> controlled on CartiaXT180Bid, Ziac5, & Micardis40; BP= 116/84 & he denies CP, palpit, dizzy, syncope, SOB, edema...    >CAD> followed by DrSimmons at [facility], on above meds; we don't have recent Cards note, pt states seen 5/12 & everything was OK, no changes made...    >AFib> followed by DrSimmons at [facility], on Coumadin, we don't have recent notes, he is due for f/u 2/13 he says & doing ok...    >PVD> he had f/u ArtDopplers that were felt to be wnl, no dilatation, no aneurysm...    >Chol> on diet alone now, states he's even intol to Cres2.5 on MWF only; pt says DrSimmons is aware & endorses his diet & exercise approach to lipid management; f/u FLP is abnorm w/ TChol 268, TG 229, HDL 44, LDL 160...    >GI> on Protonix daily, has f/u colnoscopy 9/12 by Martina Sinner showing 2 polyps (adenomas) & divertics; f/u rec in 5 yrs...    >GU> BPH, Low-T, ED; on Androgel doing 4pumps Qod & Testos level is 474; we wrote for Viagra 100mg  trial samples at his request...    >?Polycythemia> Hg has been around 18 w/ Fe elev in past; neg genetic markers for Hemochromatosis; Hg=17.9, MCV=93, Fe=132 (35%sat)...    >Derm> he has had several skin cancers removed & he follows up w/ [facility] every 52mo...          Problem List:   REACTIVE AIRWAY DISEASE (ICD-493.90) - he stopped the Advair & denies any URIs or exac over the last year... ~  CXR 11/10 showed borderline heart size, clear lungs, NAD.Marland Kitchen. ~  CXR 12/11 showed borderline cor, NAD.Marland Kitchen. ~  CXR 1/13 showed clear lungs, WNL...  HYPERTENSION (ICD-401.9) -  followed by DrSimmons at [facility]- on CARTIA XT 180mg Bid, ZIAC 5mg /d, MICARDIS 40mg Qd...  ~  12/11: last Cards note 5/11  reviewed- stable, needs to lose wt; BP=140/80, & weight about the same at 199#; he has not been dieting adeq, or exercising by his admission & we discussed this. ~  1/13: BP= 116/84 & he denies CP, palpit, SOB, dizzy, syncope, edema, etc...  CORONARY ARTERY DISEASE (ICD-414.00) - known non-obstructive CAD on cath 12/00 showing 2 vessel 30% lesions, no signif blockage, EF=60%... rec for med rx, risk factor reduction, diet, exerc... ~  Stress Test 4/09 at [facility] showed good exerc capacity, normal EF  ~  12/11: denies CP, angina, palpit, SOB, edema... rec to incr exercise, get wt down. ~  1/13: states he saw DrSimmons 5/12 but we don't have notes; he has f/u appt 2/13 & will keep me in the loop...  ATRIAL FIBRILLATION (ICD-V12.59) - attempted cardioversion unsuccessful in 2008... on COUMADIN (followed in the [facility]) ~  EKG 11/10 showed AFib, rate 62/min, NSSTTWA... ~  EKG 12/11 showed AFib controlled VR, NSSTTWA...  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 now followed by DrSimmons at [facility].  HYPERCHOLESTEROLEMIA (ICD-272.0) - intol to Lipitor; prev on CRESTOR 2.5MWF but pt stopped due to "aching all over"; he tells me that Cards is aware & they endorse  his diet & exercise efforts at Lipid management... Unfortunately wt is +- the same & not exercising... ~ FLP 3/08 showed TChol 200, TG 137, HDL 38, LDL 135... never tried other statins... ~ FLP 3/09 showed TChol 226, TG 134, HDL 42, LDL 157... we will start CRESTOR 5mg /d + CoQ 10. ~  4/09: pt started seeing the Lipid Clinic at [facility] for labs & meds... he can only tol 2.5mg  Crestor... ~  FLP 11/10 (on Cres5- 1/2tab) showed TChol 211, TG 235, HDL 39, LDL 123... copy to [facility]. ~  FLP 12/11 on Cres5- 1/2MWF showed TChol 214, TG 240, HDL 44, LDL 118... similar to prev & not at goal. ~  FLP 1/13 here on diet alone showed TChol 268, TG 229, HDL 44, LDL 160... Advised low  chol, low fat, low carb wt reducing diet...  GERD (ICD-530.81) - on PROTONIX 40mg /d... IRRITABLE BOWEL SYNDROME (ICD-564.1) COLONIC POLYPS (ICD-211.3) last colonoscopy 8/07 by DrOutlaw [place] w/ several 3-1mm adenomatous polyps removed...  ~  12/11:  DrOutlaw has moved- he will call [facility] to be reassigned & have f/u colon in 2012. ~  9/12:  He had f/u colonoscopy by DrMishra showing several polyps (adenomas) & Divertics; f/u planned 15yrs...  BENIGN PROSTATIC HYPERTROPHY, HX OF (ICD-V13.8) - DRE & PSAs have been normal... 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 on ANDROGEL 5gm/d w/ improvement, and f/u blood levels 300-500 range...  ~  testosterone level = 914 on 3/08 >> instructed to decrease the Androgel to Qod... PSA= 1.86. ~  f/u labs 3/09 showed level 708 on Qod Androgel... PSA is 2.24 ~  changed to Androgel pump- taking 5 pumps 3-4 days per week...  ~  labs 11/10 showed Testos= 712, PSA= 2.02... rec to change to 2-3 pumps per day. ~  12/11:  now on Androgel 4pumps Qod & Testos level= 494 ~  Labs 1/13 showed Testos level = 474 (continue 4pumps Qod); we wrote for Viagra 100mg  at his requests...  SCOLIOSIS (ICD-737.30)  ? of POLYCYTHEMIA (ICD-289.0) - he's had borderline high Hgb/HCT in the past w/ elevated serum iron levels (293 in 2002)... he was negative for any hemochromatosis genetic markers (C282Y, H63D, S65C)... told to avoid iron supplements and consider blood donation regularly (he has [facility] blood but can't donate now on Coumadin)... his iron level improved to 164 w/ this strategy... ~  labs 11/10 showed Hg= 18.1, HCT= 52, Fe= 144 ~  labs 12/11 showed Hg= 18.3, Hct= 52, Fe= 118 (29%sat)... ~  Labs 1/13 showed Hg= 17.9, Hct= 52, Fe= 132, (35%sat)  DERMATOLOGY - he is [facility] & seen every 71mo...  ~  Hx skin ca left shoulder 2008, "they got it all" he says, we don't have records. ~  12/11:  he  reports having mult skin lesions removed10/11 & Rx w/ CARAC Cream. ~  [facility] continues to monitor him every 6 months...  HEALTH MAINTENANCE: ~  GI:  followed by [facility]> DrOutlaw, last colon 8/07 w/ adenomatous polyp removed. ~  GU:  Hx BPH & Low-T on Androgel 4 pumps Qod w/ level = 495. ~  Immunizations:  he gets the yearly seasonal Flu vaccine... given PNEUMOVAX 12/11, age 70... Tetanus/ TDAP given 1/13... we discussed indication for the Shingles vaccine.   Past Surgical History  Procedure Date  . Nasal polyp surgery   . Circumcision     Outpatient Encounter Prescriptions  as of 09/15/2011  Medication Sig Dispense Refill  . bisoprolol-hydrochlorothiazide (ZIAC) 5-6.25 MG per tablet Take 1 tablet by mouth daily.      . calcium gluconate 500 MG tablet Take 500 mg by mouth 2 (two) times daily.      Marland Kitchen diltiazem (CARDIZEM CD) 180 MG 24 hr capsule Take 180 mg by mouth 2 (two) times daily.      . Glucosamine-Chondroit-Vit C-Mn (GLUCOSAMINE-CHONDROITIN MAX ST PO) Take 1 capsule by mouth daily.      . Multiple Vitamins-Minerals (MENS MULTIVITAMIN PLUS PO) Take 1 tablet by mouth daily.      . pantoprazole (PROTONIX) 40 MG tablet Take 40 mg by mouth daily.      . rosuvastatin (CRESTOR) 5 MG tablet Take 5 mg by mouth daily.      Marland Kitchen telmisartan (MICARDIS) 40 MG tablet Take 40 mg by mouth daily.      . Testosterone (ANDROGEL PUMP) 1.25 GM/ACT (1%) GEL Apply 3 pumps to the skin daily as directed      . warfarin (COUMADIN) 5 MG tablet Take as directed        Allergies  Allergen Reactions  . Atorvastatin     REACTION: INTOL to Lipitor    Current Medications, Allergies, Past Medical History, Past Surgical History, Family History, and Social History were reviewed in [employer] record.    Review of Systems         See HPI - all other systems neg except as noted... The patient denies anorexia, fever, weight loss, weight gain, vision loss, decreased hearing,  hoarseness, chest pain, syncope, dyspnea on exertion, peripheral edema, prolonged cough, headaches, hemoptysis, abdominal pain, melena, hematochezia, severe indigestion/heartburn, hematuria, incontinence, muscle weakness, suspicious skin lesions, transient blindness, difficulty walking, depression, unusual weight change, abnormal bleeding, enlarged lymph nodes, and angioedema.     Objective:   Physical Exam      WD, WN, 66 y/o WM in NAD... GENERAL:  Alert & oriented; pleasant & cooperative... HEENT:  /AT, EOM-wnl, PERRLA, EACs-clear, TMs-wnl, NOSE-clear, THROAT-clear & wnl. NECK:  Supple w/ fairROM; no JVD; normal carotid impulses w/o bruits; no thyromegaly or nodules palpated; no lymphadenopathy. CHEST:  Clear to P & A; without wheezes/ rales/ or rhonchi heard... HEART:  sl irreg, without murmurs/ rubs/ or gallops heard... ABDOMEN:  Soft & nontender; normal bowel sounds; no organomegaly or masses detected. RECTAL:  sl enlarged prostate- smooth w/o nodules, no masses, stool heme neg... EXT: without deformities or arthritic changes; no varicose veins/ venous insuffic/ or edema. NEURO:  CN's intact; no focal neuro deficits... DERM:  Scar left shoulder, no other lesions noted; no rash noted...  RADIOLOGY DATA:  Reviewed in the EPIC EMR & discussed w/ the patient...  LABORATORY DATA:  Reviewed in the EPIC EMR & discussed w/ the patient...   Assessment:     Hx RADS>  No recent URIs & no recent resp exac...  HBP>  Controlled on his 3 meds, continue same...  CAD>  He denies angina & he states he's more active but has not lost any weight...  Chronic AFib>  Followed by DrSimmons at [facility], stable rate control strategy; they do his Protimes in their Coumadin Clinic...  CHOL>  Poorly controlled on diet alone but he is intol to all the meds, & he tells me that [facility] endorse his diet control strategy...  GI> GERD, IBS, Polyps>  Stable & he had colonoscopy 9/12 by DrMishra- 2 polyps removed  (adenoas) f/u planned 71yrs.Marland KitchenMarland Kitchen  GU> BPH, Low-T>  Stable on the Androgel Qod; he denies voiding symptoms...  Scoliosis>  Aware, he uses OTC analgesics as needed...  ?of Polycythemia>  Aware, Hg & Fe are stable...     Plan:     Patient's Medications  New Prescriptions   No medications on file  Previous Medications   CALCIUM GLUCONATE 500 MG TABLET    Take 500 mg by mouth 2 (two) times daily.   GLUCOSAMINE-CHONDROIT-VIT C-MN (GLUCOSAMINE-CHONDROITIN MAX ST PO)    Take 1 capsule by mouth daily.   MULTIPLE VITAMINS-MINERALS (MENS MULTIVITAMIN PLUS PO)    Take 1 tablet by mouth daily.   WARFARIN (COUMADIN) 5 MG TABLET    Take as directed  Modified Medications   Modified Medication Previous Medication   BISOPROLOL-HYDROCHLOROTHIAZIDE (ZIAC) 5-6.25 MG PER TABLET bisoprolol-hydrochlorothiazide (ZIAC) 5-6.25 MG per tablet      Take 1 tablet by mouth daily.    Take 1 tablet by mouth daily.   DILTIAZEM (CARDIZEM CD) 180 MG 24 HR CAPSULE diltiazem (CARDIZEM CD) 180 MG 24 hr capsule      Take 1 capsule (180 mg total) by mouth 2 (two) times daily.    Take 180 mg by mouth 2 (two) times daily.   PANTOPRAZOLE (PROTONIX) 40 MG TABLET pantoprazole (PROTONIX) 40 MG tablet      Take 1 tablet (40 mg total) by mouth daily.    Take 40 mg by mouth daily.   TELMISARTAN (MICARDIS) 40 MG TABLET telmisartan (MICARDIS) 40 MG tablet      Take 1 tablet (40 mg total) by mouth daily.    Take 40 mg by mouth daily.   TESTOSTERONE (ANDROGEL PUMP) 1.25 GM/ACT (1%) GEL Testosterone (ANDROGEL PUMP) 1.25 GM/ACT (1%) GEL      Place 3 application onto the skin daily. Apply 3 pumps to the skin daily as directed    Apply 3 pumps to the skin daily as directed  Discontinued Medications   ROSUVASTATIN (CRESTOR) 5 MG TABLET    Take 5 mg by mouth daily.

## 2011-09-16 ENCOUNTER — Encounter: Payer: Self-pay | Admitting: Pulmonary Disease

## 2012-03-05 ENCOUNTER — Telehealth: Payer: Self-pay | Admitting: Pulmonary Disease

## 2012-03-05 MED ORDER — AMOXICILLIN-POT CLAVULANATE 875-125 MG PO TABS: 1.0000 | ORAL_TABLET | Freq: Two times a day (BID) | ORAL | Status: AC

## 2012-03-05 NOTE — Telephone Encounter (Signed)
I spoke with pt and is aware of SN recs. Rx has been sent to the pharmacy

## 2012-03-05 NOTE — Telephone Encounter (Signed)
I spoke with pt and he c/o cough w/ green phlem, stuffy nose, runny nose, PND, sore throat, hoarseness, slight increase SOB x 5 days. He has been taking tylenol--He states he is on coumadin and does not like to take much. Pt is requesting to have something called in for this. Please advise Dr. Kriste Basque, thanks  Allergies  Allergen Reactions  . Atorvastatin     REACTION: INTOL to Lipitor     [facility]

## 2012-03-05 NOTE — Telephone Encounter (Signed)
Per SN---ok to send in augmentin 875mg   #14  1 po bid .  thanks

## 2012-03-26 ENCOUNTER — Other Ambulatory Visit: Payer: Self-pay | Admitting: Pulmonary Disease

## 2012-04-02 ENCOUNTER — Telehealth: Payer: Self-pay | Admitting: Pulmonary Disease

## 2012-04-02 MED ORDER — LOSARTAN POTASSIUM 50 MG PO TABS: 50.0000 mg | ORAL_TABLET | Freq: Every day | ORAL | Status: DC

## 2012-04-02 NOTE — Telephone Encounter (Signed)
lmomtcb x1 for pt--rx has been sent to pharmacy

## 2012-04-02 NOTE — Telephone Encounter (Signed)
Per SN---change ARB to losartan 50 mg  #90   1 daily with 3 refills. thanks

## 2012-04-02 NOTE — Telephone Encounter (Signed)
Pt returned call. I advised him that rx has been called in. Nothing further needed at this time per pt. Hazel Sams

## 2012-04-02 NOTE — Telephone Encounter (Signed)
I called [facility] to advise what would be cheaper for pt and was advised they wouldn't know until new medication was run through [employer]. I spoke with spouse and she states it went up to $50 per month. She states she already paid for this month. Please advise alternative SN thanks  Allergies  Allergen Reactions  . Atorvastatin     REACTION: INTOL to Lipitor

## 2012-06-07 ENCOUNTER — Other Ambulatory Visit: Payer: Self-pay | Admitting: *Deleted

## 2012-06-07 MED ORDER — TESTOSTERONE 12.5 MG/ACT (1%) TD GEL: 3.0000 "application " | Freq: Every day | TRANSDERMAL | Status: DC

## 2012-09-17 ENCOUNTER — Other Ambulatory Visit: Payer: Self-pay | Admitting: Pulmonary Disease

## 2012-09-18 ENCOUNTER — Other Ambulatory Visit: Payer: Self-pay | Admitting: Pulmonary Disease

## 2012-09-18 MED ORDER — PANTOPRAZOLE SODIUM 40 MG PO TBEC: 40.0000 mg | DELAYED_RELEASE_TABLET | Freq: Every day | ORAL | Status: DC

## 2012-09-18 MED ORDER — DILTIAZEM HCL ER COATED BEADS 180 MG PO CP24: 180.0000 mg | ORAL_CAPSULE | Freq: Two times a day (BID) | ORAL | Status: DC

## 2012-09-18 MED ORDER — BISOPROLOL-HYDROCHLOROTHIAZIDE 5-6.25 MG PO TABS: 1.0000 | ORAL_TABLET | Freq: Every day | ORAL | Status: DC

## 2012-09-18 NOTE — Telephone Encounter (Signed)
Last ov w/ SN 1.18.13 Upcoming cpx 3.31.14  Called spoke with patient who is requesting a refill on his diltiazem 180mg  bid, ziac 5-6.25mg  qd and protonix 40mg  qd to be sent to [facility].  Rx's sent.  Pt is aware; will sign off.

## 2012-11-25 ENCOUNTER — Other Ambulatory Visit (INDEPENDENT_AMBULATORY_CARE_PROVIDER_SITE_OTHER): Payer: PRIVATE HEALTH INSURANCE

## 2012-11-25 ENCOUNTER — Ambulatory Visit (INDEPENDENT_AMBULATORY_CARE_PROVIDER_SITE_OTHER): Payer: PRIVATE HEALTH INSURANCE | Admitting: Pulmonary Disease

## 2012-11-25 ENCOUNTER — Encounter: Payer: Self-pay | Admitting: Pulmonary Disease

## 2012-11-25 ENCOUNTER — Ambulatory Visit (INDEPENDENT_AMBULATORY_CARE_PROVIDER_SITE_OTHER)
Admission: RE | Admit: 2012-11-25 | Discharge: 2012-11-25 | Disposition: A | Discharge status: Home / Self Care | Payer: PRIVATE HEALTH INSURANCE | Source: Ambulatory Visit | Attending: Pulmonary Disease | Admitting: Pulmonary Disease

## 2012-11-25 VITALS — BP 142/84 | HR 70 | Temp 97.6°F | Ht 66.0 in | Wt 203.0 lb

## 2012-11-25 DIAGNOSIS — I10 Essential (primary) hypertension: Secondary | ICD-10-CM

## 2012-11-25 DIAGNOSIS — R0609 Other forms of dyspnea: Secondary | ICD-10-CM

## 2012-11-25 DIAGNOSIS — F411 Generalized anxiety disorder: Secondary | ICD-10-CM

## 2012-11-25 DIAGNOSIS — Z87898 Personal history of other specified conditions: Secondary | ICD-10-CM

## 2012-11-25 DIAGNOSIS — M4120 Other idiopathic scoliosis, site unspecified: Secondary | ICD-10-CM

## 2012-11-25 DIAGNOSIS — K219 Gastro-esophageal reflux disease without esophagitis: Secondary | ICD-10-CM

## 2012-11-25 DIAGNOSIS — I4891 Unspecified atrial fibrillation: Secondary | ICD-10-CM

## 2012-11-25 DIAGNOSIS — E7800 Pure hypercholesterolemia, unspecified: Secondary | ICD-10-CM

## 2012-11-25 DIAGNOSIS — J45909 Unspecified asthma, uncomplicated: Secondary | ICD-10-CM

## 2012-11-25 DIAGNOSIS — I2510 Atherosclerotic heart disease of native coronary artery without angina pectoris: Secondary | ICD-10-CM

## 2012-11-25 DIAGNOSIS — E291 Testicular hypofunction: Secondary | ICD-10-CM

## 2012-11-25 DIAGNOSIS — D126 Benign neoplasm of colon, unspecified: Secondary | ICD-10-CM

## 2012-11-25 DIAGNOSIS — K589 Irritable bowel syndrome without diarrhea: Secondary | ICD-10-CM

## 2012-11-25 DIAGNOSIS — I739 Peripheral vascular disease, unspecified: Secondary | ICD-10-CM

## 2012-11-25 DIAGNOSIS — R0683 Snoring: Secondary | ICD-10-CM

## 2012-11-25 DIAGNOSIS — M1990 Unspecified osteoarthritis, unspecified site: Secondary | ICD-10-CM

## 2012-11-25 LAB — CBC WITH DIFFERENTIAL/PLATELET
Basophils Absolute: 0 10*3/uL (ref 0.0–0.1)
Eosinophils Absolute: 0.1 10*3/uL (ref 0.0–0.7)
HCT: 50.9 % (ref 39.0–52.0)
Hemoglobin: 17.5 g/dL — ABNORMAL HIGH (ref 13.0–17.0)
Lymphs Abs: 1.8 10*3/uL (ref 0.7–4.0)
MCHC: 34.4 g/dL (ref 30.0–36.0)
Neutro Abs: 3.5 10*3/uL (ref 1.4–7.7)
Platelets: 177 10*3/uL (ref 150.0–400.0)
RDW: 13.4 % (ref 11.5–14.6)

## 2012-11-25 LAB — HEPATIC FUNCTION PANEL
AST: 22 U/L (ref 0–37)
Alkaline Phosphatase: 53 U/L (ref 39–117)
Bilirubin, Direct: 0.2 mg/dL (ref 0.0–0.3)
Total Protein: 7.3 g/dL (ref 6.0–8.3)

## 2012-11-25 LAB — TSH: TSH: 0.74 u[IU]/mL (ref 0.35–5.50)

## 2012-11-25 LAB — BASIC METABOLIC PANEL
BUN: 21 mg/dL (ref 6–23)
CO2: 31 mEq/L (ref 19–32)
Calcium: 9.1 mg/dL (ref 8.4–10.5)
Creatinine, Ser: 1.1 mg/dL (ref 0.4–1.5)
Glucose, Bld: 114 mg/dL — ABNORMAL HIGH (ref 70–99)

## 2012-11-25 LAB — LIPID PANEL
Cholesterol: 224 mg/dL — ABNORMAL HIGH (ref 0–200)
Triglycerides: 173 mg/dL — ABNORMAL HIGH (ref 0.0–149.0)

## 2012-11-25 MED ORDER — CLOTRIMAZOLE-BETAMETHASONE 1-0.05 % EX CREA: TOPICAL_CREAM | Freq: Two times a day (BID) | CUTANEOUS | Status: DC

## 2012-11-25 NOTE — Progress Notes (Signed)
Subjective:     Patient ID: Jeffrey Little, male   DOB: [birth year], 67 y.o.   MRN: [id number]  HPI 67 y/o WM here for a yearly follow up exam... he has multiple medical problems as noted below...    ~  September 15, 2011:  he reports a good year- no new complaints or concerns (see below)...     HBP> controlled on CartiaXT180Bid, Ziac5, & Micardis40; BP= 116/84 & he denies CP, palpit, dizzy, syncope, SOB, edema...    CAD> followed by DrSimmons at [facility], on above meds; we don't have recent Cards note, pt states seen 5/12 & everything was OK, no changes made...    AFib> followed by DrSimmons at [facility], on Coumadin, we don't have recent notes, he is due for f/u 2/13 he says & doing ok...    PVD> he had f/u ArtDopplers that were felt to be wnl, no dilatation, no aneurysm...    Chol> on diet alone now, states he's even intol to Cres2.5 on MWF only; pt says DrSimmons is aware & endorses his diet & exercise approach to lipid management; f/u FLP is abnorm w/ TChol 268, TG 229, HDL 44, LDL 160...    GI> on Protonix daily, has f/u colnoscopy 9/12 by Martina Sinner showing 2 polyps (adenomas) & divertics; f/u rec in 5 yrs...    GU> BPH, Low-T, ED; on Androgel doing 4pumps Qod & Testos level is 474; we wrote for Viagra 100mg  trial samples at his request...    ?Polycythemia> Hg has been around 18 w/ Fe elev in past; neg genetic markers for Hemochromatosis; Hg=17.9, MCV=93, Fe=132 (35%sat)...    Derm> he has had several skin cancers removed & he follows up w/ [facility] every 75mo...  ~  November 25, 2012:  71mo ROV & Jibran has had a good interval, w/o new complaints or concerns; his wife, Meriam Sprague Banker) has wondered about OSA w/ snoring prob & we discussed need for Sleep study- he still drives cars for car dealers & denies prob sleeping, says he wakes refreshed, no daytime sleepy issues...    HBP> controlled on CartiaXT180Bid, Ziac5, & Losartan50; BP= 142/84 & he denies CP, palpit, dizzy, syncope, SOB, edema...    CAD>  followed by DrSimmons at [facility], on above meds; we don't have recent Cards note (seen 1/yr)- last 5/13 & everything was OK, no changes made...    AFib> followed by DrSimmons at [facility], on Coumadin, we don't have recent notes, but states yrly check up 5/13 doing well, no changes made...    PVD> he had f/u ArtDopplers last 5/12 & max Ao dimension=2.0cm, no dilatation, no aneurysm detected...    Chol> on diet alone now, states he's even intol to Cres2.5 on MWF only; pt says DrSimmons is aware & endorses his diet & exercise approach to lipid management; f/u FLP is abnorm w/ TChol 224, TG 173, HDL 41, LDL 160... He doesn't want non-statin alternatives; We reviewed diet, exercise, wt reduction strategies...    GI> on Protonix daily, has f/u colnoscopy 9/12 by Martina Sinner showing 2 polyps (adenomas) & divertics; f/u rec in 5 yrs...    GU> BPH, Low-T, ED; on Androgel doing 4pumps Qod (ave 3d/wk) & prev Testos level was 474- ok; PSA remains normal at 2.30...    ?Polycythemia> Hg has been around 18 w/ Fe elev in past; neg genetic markers for Hemochromatosis; Hg=17.5, MCV=90, & prev Fe=132 (35%sat)...    Derm> he has had several skin cancers removed & he follows  up w/ [facility] every 72mo... We reviewed prob list, meds, xrays and labs> see below for updates >> he had the 2013 flu vaccine 9/13 at [facility]... 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 LABS 3/14:  FLP- not at goals on diet alone;  Chems- ok x BS=114;  CBC- ok w/ Hg=17.5;  TSH=0.74;  PSA=2.30...          Problem List:   REACTIVE AIRWAY DISEASE (ICD-493.90) - he stopped the Advair & denies any URIs or exac over the last year... ~  CXR 11/10 showed borderline heart size, clear lungs, NAD.Marland Kitchen. ~  CXR 12/11 showed borderline cor, NAD.Marland Kitchen. ~  CXR 1/13 showed clear lungs, WNL.Marland Kitchen. 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  HYPERTENSION (ICD-401.9) -  followed by DrSimmons at [facility]- on CARTIA XT 180mg Bid, ZIAC 5mg /d,  MICARDIS 40mg Qd...  ~  12/11: last Cards note 5/11 reviewed- stable, needs to lose wt; BP=140/80, & weight about the same at 199#; he has not been dieting adeq, or exercising by his admission & we discussed this. ~  1/13: BP= 116/84 & he denies CP, palpit, SOB, dizzy, syncope, edema, etc... ~  3/14:  controlled on CartiaXT180Bid, Ziac5, & Losartan50; BP= 142/84 & he denies CP, palpit, dizzy, syncope, SOB, edema.  CORONARY ARTERY DISEASE (ICD-414.00) - known non-obstructive CAD on cath 12/00 showing 2 vessel 30% lesions, no signif blockage, EF=60%... rec for med rx, risk factor reduction, diet, exerc... ~  Stress Test 4/09 at [facility] showed good exerc capacity, normal EF  ~  12/11: denies CP, angina, palpit, SOB, edema... rec to incr exercise, get wt down. ~  1/13: states he saw DrSimmons 5/12 but we don't have notes; he has f/u appt 2/13 & will keep me in the loop... ~  3/14:  followed by DrSimmons at [facility], on above meds; we don't have recent Cards note (seen 1/yr)- last 5/13 & everything was OK, no changes made.  ATRIAL FIBRILLATION (ICD-V12.59) - attempted cardioversion unsuccessful in 2008... on COUMADIN (followed in [facility]) ~  EKG 11/10 showed AFib, rate 62/min, NSSTTWA... ~  EKG 12/11 showed AFib controlled VR, NSSTTWA... ~  3/14:  followed by DrSimmons at [facility], on Coumadin, we don't have recent notes, but states yrly check up 5/13 doing well, no changes made  PERIPHERAL VASCULAR DISEASE (ICD-443.9) - AAUltrasound 2/06 w/ mid-distal Ao reading 2.3-2.8cm (no change x yrs)... now followed by DrSimmons at [facility].  HYPERCHOLESTEROLEMIA (ICD-272.0) - intol to Lipitor; prev on CRESTOR 2.5MWF but pt stopped due to "aching all over"; he tells me that Cards is aware & they endorse his diet & exercise efforts at Lipid management... Unfortunately wt is +- the same & not exercising... ~ FLP 3/08 showed TChol 200, TG 137, HDL 38, LDL 135... never tried other statins... ~ FLP 3/09 showed TChol 226, TG 134,  HDL 42, LDL 157... we will start CRESTOR 5mg /d + CoQ 10. ~  4/09: pt started seeing the Lipid Clinic at [facility] for labs & meds... he can only tol 2.5mg  Crestor... ~  FLP 11/10 (on Cres5- 1/2tab) showed TChol 211, TG 235, HDL 39, LDL 123... copy to [facility]. ~  FLP 12/11 on Cres5- 1/2MWF showed TChol 214, TG 240, HDL 44, LDL 118... similar to prev & not at goal. ~  FLP 1/13 here on diet alone showed TChol 268, TG 229, HDL 44, LDL 160... Advised low chol, low fat, low carb wt reducing  diet... ~  FLP 3/14 here on diet alone showed TChol 224, TG 173, HDL 41, LDL 160... Asked to check w/ [facility] re- lipid clinic options...  GERD (ICD-530.81) - on PROTONIX 40mg /d... 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  ~  12/11:  DrOutlaw has moved- he will call [facility] to be reassigned & have f/u colon in 2012. ~  9/12:  He had f/u colonoscopy by DrMishra showing several polyps (adenomas) & Divertics; f/u planned 64yrs...  BENIGN PROSTATIC HYPERTROPHY, HX OF (ICD-V13.8) - DRE & PSAs have been normal... 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 on ANDROGEL 5gm/d w/ improvement, and f/u blood levels 300-500 range...  ~  testosterone level = 914 on 3/08 >> instructed to decrease the Androgel to Qod... PSA= 1.86. ~  f/u labs 3/09 showed level 708 on Qod Androgel... PSA is 2.24 ~  changed to Androgel pump- taking 5 pumps 3-4 days per week...  ~  labs 11/10 showed Testos= 712, PSA= 2.02... rec to change to 2-3 pumps per day. ~  12/11:  now on Androgel 4pumps Qod & Testos level= 494 ~  Labs 1/13 showed Testos level = 474 (continue 4pumps Qod); we wrote for Viagra 100mg  at his requests... ~  Labs 3/14 showed PSA= 2.30  SCOLIOSIS (ICD-737.30)  ? of POLYCYTHEMIA (ICD-289.0) - he's had borderline high Hgb/HCT in the past w/ elevated serum iron  levels (293 in 2002)... he was negative for any hemochromatosis genetic markers (C282Y, H63D, S65C)... told to avoid iron supplements and consider blood donation regularly (he has Bneg blood but can't donate now on Coumadin)... his iron level improved to 164 w/ this strategy... ~  labs 11/10 showed Hg= 18.1, HCT= 52, Fe= 144 ~  labs 12/11 showed Hg= 18.3, Hct= 52, Fe= 118 (29%sat) ~  Labs 1/13 showed Hg= 17.9, Hct= 52, Fe= 132, (35%sat) ~  Labs 3/14 showed Hg= 17.5  DERMATOLOGY - he is followed at the [facility] & seen every 33mo...  ~  Hx skin ca left shoulder 2008, "they got it all" he says, we don't have records. ~  12/11:  he reports having mult skin lesions removed10/11 & Rx w/ CARAC Cream. ~  [facility] continues to monitor him every 6 months...  HEALTH MAINTENANCE: ~  GI:  followed by [facility] de[facility]> DrOutlaw, last colon 8/07 w/ adenomatous polyp removed. ~  GU:  Hx BPH & Low-T on Androgel 4 pumps Qod w/ level = 495. ~  Immunizations:  he gets the yearly seasonal Flu vaccine... given PNEUMOVAX 12/11, age 66... Tetanus/ TDAP given 1/13... we discussed indication for the Shingles vaccine.   Past Surgical History  Procedure Laterality Date  . Nasal polyp surgery    . Circumcision      Outpatient Encounter Prescriptions as of 11/25/2012  Medication Sig Dispense Refill  . bisoprolol-hydrochlorothiazide (ZIAC) 5-6.25 MG per tablet Take 1 tablet by mouth daily.  90 tablet  0  . Calcium Carbonate-Vitamin D (CALCIUM 600+D) 600-200 MG-UNIT TABS Take 1 tablet by mouth 2 (two) times daily.      Marland Kitchen diltiazem (CARDIZEM CD) 180 MG 24 hr capsule Take 1 capsule (180 mg total) by mouth 2 (two) times daily.  180 capsule  0  . Glucosamine-Chondroit-Vit C-Mn (GLUCOSAMINE-CHONDROITIN MAX ST PO) Take 2 capsules by mouth daily.       Marland Kitchen losartan (COZAAR) 50 MG  tablet Take 1 tablet (50 mg total) by mouth daily.  90 tablet  3  . Multiple Vitamins-Minerals (MENS MULTIVITAMIN PLUS PO) Take 1 tablet by mouth  daily.      . pantoprazole (PROTONIX) 40 MG tablet Take 1 tablet (40 mg total) by mouth daily.  90 tablet  0  . Testosterone 12.5 MG/ACT (1%) GEL Apply 3 pumps to the skin daily as directed      . warfarin (COUMADIN) 5 MG tablet       . [DISCONTINUED] COUMADIN 5 MG tablet TAKE AS DIRECTED  90 tablet  4  . [DISCONTINUED] Testosterone (ANDROGEL PUMP) 1.25 GM/ACT (1%) GEL Place 3 application onto the skin daily. Apply 3 pumps to the skin daily as directed  225 g  3  . [DISCONTINUED] calcium gluconate 500 MG tablet Take 500 mg by mouth 2 (two) times daily.       No facility-administered encounter medications on file as of 11/25/2012.    Allergies  Allergen Reactions  . Atorvastatin     REACTION: INTOL to Lipitor    Current Medications, Allergies, Past Medical History, Past Surgical History, Family History, and Social History were reviewed in [employer] record.    Review of Systems         See HPI - all other systems neg except as noted... The patient denies anorexia, fever, weight loss, weight gain, vision loss, decreased hearing, hoarseness, chest pain, syncope, dyspnea on exertion, peripheral edema, prolonged cough, headaches, hemoptysis, abdominal pain, melena, hematochezia, severe indigestion/heartburn, hematuria, incontinence, muscle weakness, suspicious skin lesions, transient blindness, difficulty walking, depression, unusual weight change, abnormal bleeding, enlarged lymph nodes, and angioedema.     Objective:   Physical Exam      WD, WN, 67 y/o WM in NAD... GENERAL:  Alert & oriented; pleasant & cooperative... HEENT:  [place]/AT, EOM-wnl, PERRLA, EACs-clear, TMs-wnl, NOSE-clear, THROAT-clear & wnl. NECK:  Supple w/ fairROM; no JVD; normal carotid impulses w/o bruits; no thyromegaly or nodules palpated; no lymphadenopathy. CHEST:  Clear to P & A; without wheezes/ rales/ or rhonchi heard... HEART:  sl irreg, without murmurs/ rubs/ or gallops heard... ABDOMEN:   Soft & nontender; normal bowel sounds; no organomegaly or masses detected. RECTAL:  sl enlarged prostate- smooth w/o nodules, no masses, stool heme neg... EXT: without deformities or arthritic changes; no varicose veins/ venous insuffic/ or edema. NEURO:  CN's intact; no focal neuro deficits... DERM:  Scar left shoulder, no other lesions noted; no rash noted...  RADIOLOGY DATA:  Reviewed in the EPIC EMR & discussed w/ the patient...  LABORATORY DATA:  Reviewed in the EPIC EMR & discussed w/ the patient...   Assessment:      Hx RADS>  No recent URIs & no recent resp exac...  HBP>  Controlled on his 3 meds, continue same...  CAD>  He denies angina & he states he's more active but has not lost any weight...  Chronic AFib>  Follow[facility] by [facility] at [facility], stable rate control strategy; they do his Protimes in their Coumadin Clinic...  CHOL>  Poorly controlled on diet alone but he is intol to all the meds, & he tells me that [facility] endorse his diet control strategy...  GI> GERD, IBS, Polyps>  Stable & he had colonoscopy 9/12 by DrMishra- 2 polyps removed (adenoas) f/u planned 7yrs...  GU> BPH, Low-T>  Stable on the Androgel Qod; he denies voiding symptoms...  Scoliosis>  Aware, he uses OTC analgesics as needed...  ?of Polycythemia>  Aware, Hg & Fe are stable...     Plan:     Patient's Medications  New Prescriptions   CLOTRIMAZOLE-BETAMETHASONE (LOTRISONE) CREAM    Apply topically 2 (two) times daily.  Previous Medications   BISOPROLOL-HYDROCHLOROTHIAZIDE (ZIAC) 5-6.25 MG PER TABLET    Take 1 tablet by mouth daily.   CALCIUM CARBONATE-VITAMIN D (CALCIUM 600+D) 600-200 MG-UNIT TABS    Take 1 tablet by mouth 2 (two) times daily.   DILTIAZEM (CARDIZEM CD) 180 MG 24 HR CAPSULE    Take 1 capsule (180 mg total) by mouth 2 (two) times daily.   GLUCOSAMINE-CHONDROIT-VIT C-MN (GLUCOSAMINE-CHONDROITIN MAX ST PO)    Take 2 capsules by mouth daily.    LOSARTAN (COZAAR) 50 MG TABLET    Take 1  tablet (50 mg total) by mouth daily.   MULTIPLE VITAMINS-MINERALS (MENS MULTIVITAMIN PLUS PO)    Take 1 tablet by mouth daily.   PANTOPRAZOLE (PROTONIX) 40 MG TABLET    Take 1 tablet (40 mg total) by mouth daily.  Modified Medications   Modified Medication Previous Medication   TESTOSTERONE 12.5 MG/ACT (1%) GEL Testosterone (ANDROGEL PUMP) 1.25 GM/ACT (1%) GEL      Apply 3 pumps to the skin daily as directed    Place 3 application onto the skin daily. Apply 3 pumps to the skin daily as directed   WARFARIN (COUMADIN) 5 MG TABLET COUMADIN 5 MG tablet          TAKE AS DIRECTED  Discontinued Medications   CALCIUM GLUCONATE 500 MG TABLET    Take 500 mg by mouth 2 (two) times daily.

## 2012-11-25 NOTE — Patient Instructions (Addendum)
Today we updated your med list in our EPIC system...    Continue your current medications the same...    We rerilled your meds per request...  Today we did your follow up CXR & fasting blood work...    We will contact you w/ the results when available...   Let's get on track w/ our diet 7 exercise program...    The goal is to lose 15-20 lbs!!!  We will arrange for a Sleep Study & we'll call you w/ the results when avail...  Call for any questions...  Let's plan a follow up visit in 32yr, sooner if needed for problems.Marland KitchenMarland Kitchen

## 2012-11-28 ENCOUNTER — Telehealth: Payer: Self-pay | Admitting: Pulmonary Disease

## 2012-11-28 NOTE — Telephone Encounter (Signed)
Spoke with patient, informed him of results/recs as listed below per Dr. Kriste Basque Patient verbalized understanding and nothing further needed at this time   Notes Recorded by Michele Mcalpine, MD on 11/28/2012 at 3:17 PM Please notify patient>  FLP NOT at goal on diet alone & LDL=160... I rec that he ask DrSimmons at [facility] if they have a Lipid Clinic to see for help... Chems are OK> FBS=114 & needs better low carb diet, get wt down!!! CBC, Thyroid, PSA> all wnl/ OK.Marland KitchenMarland Kitchen

## 2012-11-28 NOTE — Progress Notes (Signed)
Quick Note:  Spoke with patient, informed him of results/recs as listed below per Dr. Kriste Basque Patient verbalized understanding and nothing further needed at this time ______

## 2012-12-09 ENCOUNTER — Telehealth: Payer: Self-pay | Admitting: Pulmonary Disease

## 2012-12-09 NOTE — Telephone Encounter (Signed)
[email or web address].Janyia Guion, CMA  

## 2012-12-10 ENCOUNTER — Other Ambulatory Visit: Payer: Self-pay | Admitting: Pulmonary Disease

## 2012-12-10 NOTE — Telephone Encounter (Signed)
Referral and records faxed to [facility] for [facility] to be scheduled per their protocol left pt a message to expect to hear from them Tobe Sos

## 2012-12-10 NOTE — Telephone Encounter (Signed)
Pt returned call. Jeffrey Little  

## 2012-12-10 NOTE — Telephone Encounter (Signed)
lmomtcb x 2  

## 2012-12-10 NOTE — Telephone Encounter (Signed)
I spoke with pt and he is requesting to have his sleep study done at [facility] instead of [facility]. Please advise [facility]. thanks

## 2012-12-18 ENCOUNTER — Encounter (HOSPITAL_BASED_OUTPATIENT_CLINIC_OR_DEPARTMENT_OTHER): Payer: PRIVATE HEALTH INSURANCE

## 2013-01-30 DIAGNOSIS — J309 Allergic rhinitis, unspecified: Secondary | ICD-10-CM | POA: Insufficient documentation

## 2013-02-25 ENCOUNTER — Other Ambulatory Visit: Payer: Self-pay | Admitting: Pulmonary Disease

## 2013-03-28 HISTORY — PX: OTHER SURGICAL HISTORY: SHX169

## 2013-04-29 ENCOUNTER — Other Ambulatory Visit: Payer: Self-pay | Admitting: Pulmonary Disease

## 2013-05-02 ENCOUNTER — Telehealth: Payer: Self-pay | Admitting: Pulmonary Disease

## 2013-05-02 MED ORDER — LOSARTAN POTASSIUM 50 MG PO TABS: 50.0000 mg | ORAL_TABLET | Freq: Every day | ORAL | Status: DC

## 2013-05-02 MED ORDER — TESTOSTERONE 12.5 MG/ACT (1%) TD GEL: 3.0000 | Freq: Every day | TRANSDERMAL | Status: DC

## 2013-05-02 NOTE — Telephone Encounter (Signed)
PT informed that refills were sent to pharmacy.

## 2013-07-08 ENCOUNTER — Encounter: Payer: Self-pay | Admitting: Internal Medicine

## 2013-07-18 ENCOUNTER — Other Ambulatory Visit: Payer: Self-pay | Admitting: Pulmonary Disease

## 2013-10-15 ENCOUNTER — Ambulatory Visit (INDEPENDENT_AMBULATORY_CARE_PROVIDER_SITE_OTHER)
Admission: RE | Admit: 2013-10-15 | Discharge: 2013-10-15 | Disposition: A | Discharge status: Home / Self Care | Payer: PRIVATE HEALTH INSURANCE | Source: Ambulatory Visit | Attending: Physician Assistant | Admitting: Physician Assistant

## 2013-10-15 ENCOUNTER — Encounter: Payer: Self-pay | Admitting: Physician Assistant

## 2013-10-15 ENCOUNTER — Ambulatory Visit (INDEPENDENT_AMBULATORY_CARE_PROVIDER_SITE_OTHER): Payer: PRIVATE HEALTH INSURANCE | Admitting: Physician Assistant

## 2013-10-15 VITALS — BP 120/74 | HR 75 | Temp 99.0°F | Wt 203.0 lb

## 2013-10-15 DIAGNOSIS — J209 Acute bronchitis, unspecified: Secondary | ICD-10-CM

## 2013-10-15 MED ORDER — AZITHROMYCIN 500 MG PO TABS: 500.0000 mg | ORAL_TABLET | Freq: Every day | ORAL | Status: DC

## 2013-10-15 MED ORDER — METHYLPREDNISOLONE 4 MG PO KIT: PACK | ORAL | Status: DC

## 2013-10-15 NOTE — Patient Instructions (Addendum)
It was great meeting you today Jeffrey Little!  I have ordered a chest xray for you, please report to xray in the basement.   Bronchitis Bronchitis is swelling (inflammation) of the air tubes leading to your lungs (bronchi). This causes mucus and a cough. If the swelling gets bad, you may have trouble breathing. HOME CARE   Rest.  Drink enough fluids to keep your pee (urine) clear or pale yellow (unless you have a condition where you have to watch how much you drink).  Only take medicine as told by your doctor. If you were given antibiotic medicines, finish them even if you start to feel better.  Avoid smoke, irritating chemicals, and strong smells. These make the problem worse. Quit smoking if you smoke. This helps your lungs heal faster.  Use a cool mist humidifier. Change the water in the humidifier every day. You can also sit in the bathroom with hot shower running for 5 10 minutes. Keep the door closed.  See your health care provider as told.  Wash your hands often. GET HELP IF: Your problems do not get better after 1 week. GET HELP RIGHT AWAY IF:   Your fever gets worse.  You have chills.  Your chest hurts.  Your problems breathing get worse.  You have blood in your mucus.  You pass out (faint).  You feel lightheaded.  You have a bad headache.  You throw up (vomit) again and again. MAKE SURE YOU:  Understand these instructions.  Will watch your condition.  Will get help right away if you are not doing well or get worse. Document Released: 01/31/2008 Document Revised: 06/04/2013 Document Reviewed: 04/08/2013 [facility] Patient Information 2014 [place], [place].

## 2013-10-15 NOTE — Progress Notes (Signed)
Subjective:    Patient ID: Jeffrey Little, male    DOB: 19-Nov-1945, 68 y.o.   MRN: [id number]  Cough This is a new problem. The current episode started in the past 7 days. The problem has been gradually worsening. The problem occurs hourly. The cough is productive of sputum. Associated symptoms include a fever and wheezing. Pertinent negatives include no chest pain, chills, ear congestion, ear pain, headaches, nasal congestion, rhinorrhea, sore throat or shortness of breath. The symptoms are aggravated by cold air. His past medical history is significant for bronchitis. There is no history of asthma.  Fever  This is a new problem. The current episode started today. The problem has been resolved. His temperature was unmeasured prior to arrival. Associated symptoms include coughing and wheezing. Pertinent negatives include no chest pain, congestion, ear pain, headaches, nausea, sore throat or vomiting. He has tried acetaminophen for the symptoms. The treatment provided significant relief.   Past Medical History  Diagnosis Date  . Reactive airway disease   . Hypertension   . Coronary artery disease   . Atrial fibrillation   . Peripheral vascular disease   . Hypercholesteremia   . GERD (gastroesophageal reflux disease)   . IBS (irritable bowel syndrome)   . Hx of colonic polyps   . BPH (benign prostatic hyperplasia)   . Testosterone deficiency   . Scoliosis   . Polycythemia     Review of Systems  Constitutional: Positive for fever. Negative for chills, activity change and appetite change.  HENT: Negative for congestion, ear pain, rhinorrhea and sore throat.   Respiratory: Positive for cough and wheezing. Negative for chest tightness and shortness of breath.   Cardiovascular: Negative for chest pain.  Gastrointestinal: Negative for nausea and vomiting.  Neurological: Negative for headaches.       Objective:   Physical Exam  Vitals reviewed. Constitutional: He is oriented to  person, place, and time. He appears well-developed and well-nourished. No distress.  HENT:  Head: Normocephalic and atraumatic.  Right Ear: Hearing, tympanic membrane, external ear and ear canal normal.  Left Ear: Hearing, tympanic membrane, external ear and ear canal normal.  Nose: Nose normal.  Mouth/Throat: Uvula is midline, oropharynx is clear and moist and mucous membranes are normal.  Eyes: Conjunctivae are normal.  Neck: Normal range of motion.  Cardiovascular: Normal rate, regular rhythm and normal heart sounds.  Exam reveals no gallop and no friction rub.   No murmur heard. Pulses:      Radial pulses are 2+ on the right side, and 2+ on the left side.  Pulmonary/Chest: Effort normal and breath sounds normal. He has no wheezes. He has no rhonchi. He has no rales.  Musculoskeletal: Normal range of motion.  Neurological: He is alert and oriented to person, place, and time.  Skin: Skin is warm and dry.  Psychiatric: He has a normal mood and affect.    Lab Results  Component Value Date   WBC 5.9 11/25/2012   HGB 17.5* 11/25/2012   HCT 50.9 11/25/2012   PLT 177.0 11/25/2012   GLUCOSE 114* 11/25/2012   CHOL 224* 11/25/2012   TRIG 173.0* 11/25/2012   HDL 41.00 11/25/2012   LDLDIRECT 159.5 11/25/2012   LDLCALC 157* 11/08/2007   ALT 24 11/25/2012   AST 22 11/25/2012   NA 139 11/25/2012   K 4.6 11/25/2012   CL 100 11/25/2012   CREATININE 1.1 11/25/2012   BUN 21 11/25/2012   CO2 31 11/25/2012   TSH 0.74 11/25/2012  PSA 2.30 11/25/2012   Filed Vitals:   10/15/13 1427  BP: 120/74  Pulse: 75  Temp: 99 F (37.2 C)       Assessment & Plan:   Bronchitis Rx for Zithromax 500 mg and Medrol dosepak Reported fever early today responded well to Tylenol, 99* this afternoon, productive cough, reported wheezing last evening, CTA today, will get chest xray today.

## 2013-10-15 NOTE — Progress Notes (Signed)
Pre-visit discussion using our clinic review tool. No additional management support is needed unless otherwise documented below in the visit note.  

## 2013-10-30 ENCOUNTER — Telehealth: Payer: Self-pay | Admitting: Pulmonary Disease

## 2013-10-30 DIAGNOSIS — I10 Essential (primary) hypertension: Secondary | ICD-10-CM

## 2013-10-30 NOTE — Telephone Encounter (Signed)
Pt is aware that SN will no longer be seeing pts for primary care.  Order placed for the pt to be set up with [facility].

## 2013-11-03 ENCOUNTER — Ambulatory Visit: Payer: PRIVATE HEALTH INSURANCE | Admitting: Family Medicine

## 2013-11-10 ENCOUNTER — Encounter: Payer: Self-pay | Admitting: Family Medicine

## 2013-11-10 ENCOUNTER — Ambulatory Visit (INDEPENDENT_AMBULATORY_CARE_PROVIDER_SITE_OTHER): Payer: PRIVATE HEALTH INSURANCE | Admitting: Family Medicine

## 2013-11-10 VITALS — BP 142/92 | HR 59 | Temp 98.1°F | Resp 18 | Ht 64.5 in | Wt 199.0 lb

## 2013-11-10 DIAGNOSIS — E291 Testicular hypofunction: Secondary | ICD-10-CM

## 2013-11-10 DIAGNOSIS — I4891 Unspecified atrial fibrillation: Secondary | ICD-10-CM

## 2013-11-10 DIAGNOSIS — I10 Essential (primary) hypertension: Secondary | ICD-10-CM | POA: Insufficient documentation

## 2013-11-10 DIAGNOSIS — E349 Endocrine disorder, unspecified: Secondary | ICD-10-CM

## 2013-11-10 DIAGNOSIS — I4821 Permanent atrial fibrillation: Secondary | ICD-10-CM | POA: Insufficient documentation

## 2013-11-10 NOTE — Progress Notes (Signed)
Pre visit review using our clinic review tool, if applicable. No additional management support is needed unless otherwise documented below in the visit note. 

## 2013-11-10 NOTE — Progress Notes (Signed)
Office Note 11/10/2013  CC:  Chief Complaint  Patient presents with  . Establish Care    HPI:  Jeffrey Little is a 68 y.o. White male who is here to establish care. Patient's most recent primary MD: Dr. Lenna Gilford for the last 30+ yrs---no longer doing primary care. Specialist: Dr. Rosita Fire in [facility] cardiology-coumadin checks are done there.  Derm at [facility].   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  Old records in EPIC/HL EMR were reviewed prior to or during today's visit.  No acute complaints. Due for CPE with labs in 2-3 weeks.  Past Medical History  Diagnosis Date  . Reactive airway disease   . Hypertension   . Coronary artery disease   . Permanent atrial fibrillation 2001    failed DCCV per pt  . Peripheral vascular disease   . Hypercholesteremia   . GERD (gastroesophageal reflux disease)   . IBS (irritable bowel syndrome)   . Hx of colonic polyps   . BPH (benign prostatic hyperplasia)   . Testosterone deficiency   . Scoliosis   . Polycythemia     Past Surgical History  Procedure Laterality Date  . Nasal polyp surgery    . Circumcision    . Colonoscopy w/ polypectomy      next is due approx 2016 per pt  . Vasectomy  1983  . Skin cancer excision      Non-malanoma  . Cardiac catheterization  approx 2001    nonobstruc CAD    History reviewed. No pertinent family history.  History   Social History  . Marital Status: Married    Spouse Name: N/A    Number of Children: 2  . Years of Education: N/A   Occupational History  . retired    Social History Main Topics  . Smoking status: Former Smoker -- 34 years    Types: Cigars    Quit date: 08/28/1974  . Smokeless tobacco: Never Used  . Alcohol Use: No  . Drug Use: No  . Sexual Activity: Not on file   Other Topics Concern  . Not on file   Social History Narrative   Married, 2 sons, 4 grandchildren.   Orig from [place].   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   No cigs, no alcohol, no drugs.   No exercise.    Outpatient Encounter Prescriptions as of 11/10/2013  Medication Sig  . bisoprolol-hydrochlorothiazide (ZIAC) 5-6.25 MG per tablet TAKE 1 TABLET BY MOUTH DAILY.  . Calcium Carbonate-Vitamin D (CALCIUM 600+D) 600-200 MG-UNIT TABS Take 1 tablet by mouth 2 (two) times daily.  . clotrimazole-betamethasone (LOTRISONE) cream Apply topically 2 (two) times daily.  Marland Kitchen diltiazem (CARDIZEM CD) 180 MG 24 hr capsule TAKE 1 CAPSULE (180 MG TOTAL) BY MOUTH 2 (TWO) TIMES DAILY.  Marland Kitchen Glucosamine-Chondroit-Vit C-Mn (GLUCOSAMINE-CHONDROITIN MAX ST PO) Take 2 capsules by mouth daily.   Marland Kitchen losartan (COZAAR) 50 MG tablet Take 1 tablet (50 mg total) by mouth daily.  . Multiple Vitamins-Minerals (MENS MULTIVITAMIN PLUS PO) Take 1 tablet by mouth daily.  . pantoprazole (PROTONIX) 40 MG tablet TAKE 1 TABLET (40 MG TOTAL) BY MOUTH DAILY.  Marland Kitchen Testosterone 12.5 MG/ACT (1%) GEL Apply 3 Squirts topically daily. Apply 3 pumps to the skin daily as directed  . warfarin (COUMADIN) 5 MG tablet   . COUMADIN 5 MG tablet TAKE AS DIRECTED  . methylPREDNISolone (MEDROL DOSEPAK) 4 MG tablet follow package directions  . [DISCONTINUED] azithromycin (ZITHROMAX) 500  MG tablet Take 1 tablet (500 mg total) by mouth daily.    Allergies  Allergen Reactions  . Atorvastatin     REACTION: INTOL to Lipitor    ROS Review of Systems  Constitutional: Negative for fever and fatigue.  HENT: Negative for congestion and sore throat.   Eyes: Negative for visual disturbance.  Respiratory: Negative for cough.   Cardiovascular: Negative for chest pain.  Gastrointestinal: Negative for nausea and abdominal pain.  Genitourinary: Negative for dysuria.  Musculoskeletal: Negative for back pain and joint swelling.  Skin: Negative for rash.  Neurological: Negative for weakness and headaches.  Hematological: Negative for adenopathy.    PE; Blood pressure 142/92, pulse 59,  temperature 98.1 F (36.7 C), temperature source Temporal, resp. rate 18, height 5' 4.5" (1.638 m), weight 199 lb (90.266 kg), SpO2 97.00%. Gen: Alert, well appearing.  Patient is oriented to person, place, time, and situation. OVZ:CHYI: no injection, icteris, swelling, or exudate.  EOMI, PERRLA. Mouth: lips without lesion/swelling.  Oral mucosa pink and moist. Oropharynx without erythema, exudate, or swelling.  CV: irreg rhythm, no m/r/g Chest is clear, no wheezing or rales. Normal symmetric air entry throughout both lung fields. No chest wall deformities or tenderness. EXT: no clubbing, cyanosis, or edema.   Pertinent labs:  None today  ASSESSMENT AND PLAN:   New Pt; establishing care. Stable atrial fib, HTN, HTN, hypogonadism, OSA. No meds needed today.  An After Visit Summary was printed and given to the patient.  Return in about 3 weeks (around 12/01/2013) for annual CPE (fasting).

## 2013-11-11 ENCOUNTER — Telehealth: Payer: Self-pay | Admitting: Family Medicine

## 2013-11-11 NOTE — Telephone Encounter (Signed)
Relevant patient education assigned to patient using Emmi. ° °

## 2013-11-26 ENCOUNTER — Ambulatory Visit: Payer: PRIVATE HEALTH INSURANCE | Admitting: Pulmonary Disease

## 2013-12-01 ENCOUNTER — Ambulatory Visit (INDEPENDENT_AMBULATORY_CARE_PROVIDER_SITE_OTHER): Payer: PRIVATE HEALTH INSURANCE | Admitting: Family Medicine

## 2013-12-01 ENCOUNTER — Encounter: Payer: Self-pay | Admitting: Family Medicine

## 2013-12-01 VITALS — BP 138/87 | HR 70 | Temp 98.2°F | Resp 18 | Ht 64.5 in | Wt 200.0 lb

## 2013-12-01 DIAGNOSIS — I4821 Permanent atrial fibrillation: Secondary | ICD-10-CM

## 2013-12-01 DIAGNOSIS — Z0389 Encounter for observation for other suspected diseases and conditions ruled out: Secondary | ICD-10-CM | POA: Insufficient documentation

## 2013-12-01 DIAGNOSIS — I4891 Unspecified atrial fibrillation: Secondary | ICD-10-CM

## 2013-12-01 DIAGNOSIS — I10 Essential (primary) hypertension: Secondary | ICD-10-CM

## 2013-12-01 DIAGNOSIS — E7800 Pure hypercholesterolemia, unspecified: Secondary | ICD-10-CM

## 2013-12-01 DIAGNOSIS — D751 Secondary polycythemia: Secondary | ICD-10-CM | POA: Insufficient documentation

## 2013-12-01 LAB — CBC WITH DIFFERENTIAL/PLATELET
BASOS ABS: 0 10*3/uL (ref 0.0–0.1)
BASOS PCT: 0.5 % (ref 0.0–3.0)
EOS ABS: 0.1 10*3/uL (ref 0.0–0.7)
Eosinophils Relative: 1.8 % (ref 0.0–5.0)
HCT: 51.4 % (ref 39.0–52.0)
Hemoglobin: 17.5 g/dL — ABNORMAL HIGH (ref 13.0–17.0)
Lymphocytes Relative: 31.3 % (ref 12.0–46.0)
Lymphs Abs: 2 10*3/uL (ref 0.7–4.0)
MCHC: 34.2 g/dL (ref 30.0–36.0)
MCV: 91.3 fl (ref 78.0–100.0)
Monocytes Absolute: 0.5 10*3/uL (ref 0.1–1.0)
Monocytes Relative: 7.7 % (ref 3.0–12.0)
NEUTROS PCT: 58.7 % (ref 43.0–77.0)
Neutro Abs: 3.8 10*3/uL (ref 1.4–7.7)
Platelets: 176 10*3/uL (ref 150.0–400.0)
RBC: 5.63 Mil/uL (ref 4.22–5.81)
RDW: 13.9 % (ref 11.5–14.6)
WBC: 6.5 10*3/uL (ref 4.5–10.5)

## 2013-12-01 LAB — COMPREHENSIVE METABOLIC PANEL
ALT: 23 U/L (ref 0–53)
AST: 19 U/L (ref 0–37)
Albumin: 4.3 g/dL (ref 3.5–5.2)
Alkaline Phosphatase: 48 U/L (ref 39–117)
BUN: 21 mg/dL (ref 6–23)
CALCIUM: 9.5 mg/dL (ref 8.4–10.5)
CHLORIDE: 99 meq/L (ref 96–112)
CO2: 32 mEq/L (ref 19–32)
Creatinine, Ser: 1.1 mg/dL (ref 0.4–1.5)
GFR: 71.43 mL/min (ref >60.00)
Glucose, Bld: 95 mg/dL (ref 70–99)
Potassium: 4.2 mEq/L (ref 3.5–5.1)
Sodium: 140 mEq/L (ref 135–145)
Total Bilirubin: 1.1 mg/dL (ref 0.3–1.2)
Total Protein: 7.2 g/dL (ref 6.0–8.3)

## 2013-12-01 LAB — LIPID PANEL
CHOLESTEROL: 218 mg/dL — AB (ref 0–200)
HDL: 41.6 mg/dL (ref >39.00)
LDL Cholesterol: 131 mg/dL — ABNORMAL HIGH (ref 0–99)
Total CHOL/HDL Ratio: 5
Triglycerides: 227 mg/dL — ABNORMAL HIGH (ref 0.0–149.0)
VLDL: 45.4 mg/dL — ABNORMAL HIGH (ref 0.0–40.0)

## 2013-12-01 LAB — PSA, MEDICARE: PSA: 2.53 ng/mL (ref 0.10–4.00)

## 2013-12-01 LAB — TSH: TSH: 0.6 u[IU]/mL (ref 0.35–5.50)

## 2013-12-01 NOTE — Assessment & Plan Note (Signed)
Prostate cancer screening. DRE normal today. PSA drawn today.

## 2013-12-01 NOTE — Assessment & Plan Note (Signed)
Followed by [facility] regularly, including coumadin monitoring/dosing. The current medical regimen is effective;  continue present plan and medications.

## 2013-12-01 NOTE — Assessment & Plan Note (Signed)
The current medical regimen is effective;  continue present plan and medications. Check FLP today.

## 2013-12-01 NOTE — Progress Notes (Signed)
Pre visit review using our clinic review tool, if applicable. No additional management support is needed unless otherwise documented below in the visit note. 

## 2013-12-01 NOTE — Progress Notes (Signed)
Office Note 12/01/2013  CC:  Chief Complaint  Patient presents with  . Annual Exam    fasting    HPI:  Jeffrey Little is a 68 y.o. White male who is here for CPE. Has beenseeing Dr. Rosita Fire, cardiologist for [place], about his hyperlipidemia and atrial fib/cad---he is intolerant of statins and is not on any chol meds at this time. Feels good, no probs.   Past Medical History  Diagnosis Date  . Reactive airway disease   . Hypertension   . Coronary artery disease   . Permanent atrial fibrillation 2001    failed DCCV per pt  . Peripheral vascular disease   . Hypercholesteremia   . GERD (gastroesophageal reflux disease)   . IBS (irritable bowel syndrome)   . Hx of colonic polyps   . BPH (benign prostatic hyperplasia)   . Testosterone deficiency   . Scoliosis   . Polycythemia     Past Surgical History  Procedure Laterality Date  . Nasal polyp surgery    . Circumcision    . Colonoscopy w/ polypectomy      next is due approx 2016 per pt  . Vasectomy  1983  . Skin cancer excision      Non-malanoma  . Cardiac catheterization  approx 2001    nonobstruc CAD    No family history on file.  History   Social History  . Marital Status: Married    Spouse Name: N/A    Number of Children: 2  . Years of Education: N/A   Occupational History  . retired    Social History Main Topics  . Smoking status: Former Smoker -- 34 years    Types: Cigars    Quit date: 08/28/1974  . Smokeless tobacco: Never Used  . Alcohol Use: No  . Drug Use: No  . Sexual Activity: Not on file   Other Topics Concern  . Not on file   Social History Narrative   Married, 2 sons, 4 grandchildren.   Orig from [place].   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   No cigs, no alcohol, no drugs.   No exercise.    Outpatient Prescriptions Prior to Visit  Medication Sig Dispense Refill  . bisoprolol-hydrochlorothiazide (ZIAC) 5-6.25 MG per tablet  TAKE 1 TABLET BY MOUTH DAILY.  90 tablet  2  . Calcium Carbonate-Vitamin D (CALCIUM 600+D) 600-200 MG-UNIT TABS Take 1 tablet by mouth 2 (two) times daily.      . clotrimazole-betamethasone (LOTRISONE) cream Apply topically 2 (two) times daily.  30 g  5  . COUMADIN 5 MG tablet TAKE AS DIRECTED  90 tablet  4  . diltiazem (CARDIZEM CD) 180 MG 24 hr capsule TAKE 1 CAPSULE (180 MG TOTAL) BY MOUTH 2 (TWO) TIMES DAILY.  180 capsule  3  . Glucosamine-Chondroit-Vit C-Mn (GLUCOSAMINE-CHONDROITIN MAX ST PO) Take 2 capsules by mouth daily.       Marland Kitchen losartan (COZAAR) 50 MG tablet Take 1 tablet (50 mg total) by mouth daily.  90 tablet  2  . Multiple Vitamins-Minerals (MENS MULTIVITAMIN PLUS PO) Take 1 tablet by mouth daily.      . pantoprazole (PROTONIX) 40 MG tablet TAKE 1 TABLET (40 MG TOTAL) BY MOUTH DAILY.  90 tablet  3  . Testosterone 12.5 MG/ACT (1%) GEL Apply 3 Squirts topically daily. Apply 3 pumps to the skin daily as directed  150 g  3  . warfarin (COUMADIN) 5 MG tablet       .  methylPREDNISolone (MEDROL DOSEPAK) 4 MG tablet follow package directions  21 tablet  0   No facility-administered medications prior to visit.    Allergies  Allergen Reactions  . Atorvastatin     REACTION: INTOL to Lipitor    ROS Review of Systems  Constitutional: Negative for fever, chills, appetite change and fatigue.  HENT: Negative for congestion, dental problem, ear pain and sore throat.   Eyes: Negative for discharge, redness and visual disturbance.  Respiratory: Negative for cough, chest tightness, shortness of breath and wheezing.   Cardiovascular: Negative for chest pain, palpitations and leg swelling.  Gastrointestinal: Negative for nausea, vomiting, abdominal pain, diarrhea and blood in stool.  Genitourinary: Negative for dysuria, urgency, frequency, hematuria, flank pain and difficulty urinating.  Musculoskeletal: Negative for arthralgias, back pain, joint swelling, myalgias and neck stiffness.  Skin:  Negative for pallor and rash.  Neurological: Negative for dizziness, speech difficulty, weakness and headaches.  Hematological: Negative for adenopathy. Does not bruise/bleed easily.  Psychiatric/Behavioral: Negative for confusion and sleep disturbance. The patient is not nervous/anxious.     PE; Blood pressure 138/87, pulse 70, temperature 98.2 F (36.8 C), temperature source Temporal, resp. rate 18, height 5' 4.5" (1.638 m), weight 200 lb (90.719 kg), SpO2 96.00%. Gen: Alert, well appearing.  Patient is oriented to person, place, time, and situation. AFFECT: pleasant, lucid thought and speech. ENT: Ears: EACs clear, normal epithelium.  TMs with good light reflex and landmarks bilaterally.  Eyes: no injection, icteris, swelling, or exudate.  EOMI, PERRLA. Nose: no drainage or turbinate edema/swelling.  No injection or focal lesion.  Mouth: lips without lesion/swelling.  Oral mucosa pink and moist.  Dentition intact and without obvious caries or gingival swelling.  Oropharynx without erythema, exudate, or swelling.  Neck: supple/nontender.  No LAD, mass, or TM.  Carotid pulses 2+ bilaterally, without bruits. CV: RRR, no m/r/g.   LUNGS: CTA bilat, nonlabored resps, good aeration in all lung fields. ABD: soft, NT, ND, BS normal.  No hepatospenomegaly or mass.  No bruits. EXT: no clubbing, cyanosis, or edema.  Musculoskeletal: no joint swelling, erythema, warmth, or tenderness.  ROM of all joints intact. Skin - no sores or suspicious lesions or rashes or color changes  Pertinent labs:  None today  ASSESSMENT AND PLAN:   HYPERTENSION The current medical regimen is effective;  continue present plan and medications. Lytes/cr today.  HYPERCHOLESTEROLEMIA The current medical regimen is effective;  continue present plan and medications. Check FLP today.  Permanent atrial fibrillation Followed by [facility] regularly, including coumadin monitoring/dosing. The current medical regimen is effective;   continue present plan and medications.   Observation for suspected malignant neoplasm Prostate cancer screening. DRE normal today. PSA drawn today.   Hx of polycythemia: suspect related to testosterone replacement. Recheck CBC today.  Testost level at next visit when the blood draw will be more appropriately timed.  An After Visit Summary was printed and given to the patient.  FOLLOW UP:  Return in about 6 months (around 06/02/2014) for routine chronic illness f/u.

## 2013-12-01 NOTE — Assessment & Plan Note (Signed)
The current medical regimen is effective;  continue present plan and medications. Lytes/cr today. 

## 2013-12-08 ENCOUNTER — Other Ambulatory Visit: Payer: Self-pay | Admitting: Pulmonary Disease

## 2013-12-08 ENCOUNTER — Other Ambulatory Visit: Payer: Self-pay | Admitting: Family Medicine

## 2013-12-08 MED ORDER — LOSARTAN POTASSIUM 50 MG PO TABS: 50.0000 mg | ORAL_TABLET | Freq: Every day | ORAL | Status: DC

## 2014-02-19 IMAGING — CR DG CHEST 2V
2 series · 2 of 2 positions shown · non-contrast
Comparison: 11/25/2012

CLINICAL DATA: Cough, bronchitis, question pneumonia

EXAM:
CHEST  2 VIEW

[view not recorded (1 of 2)]
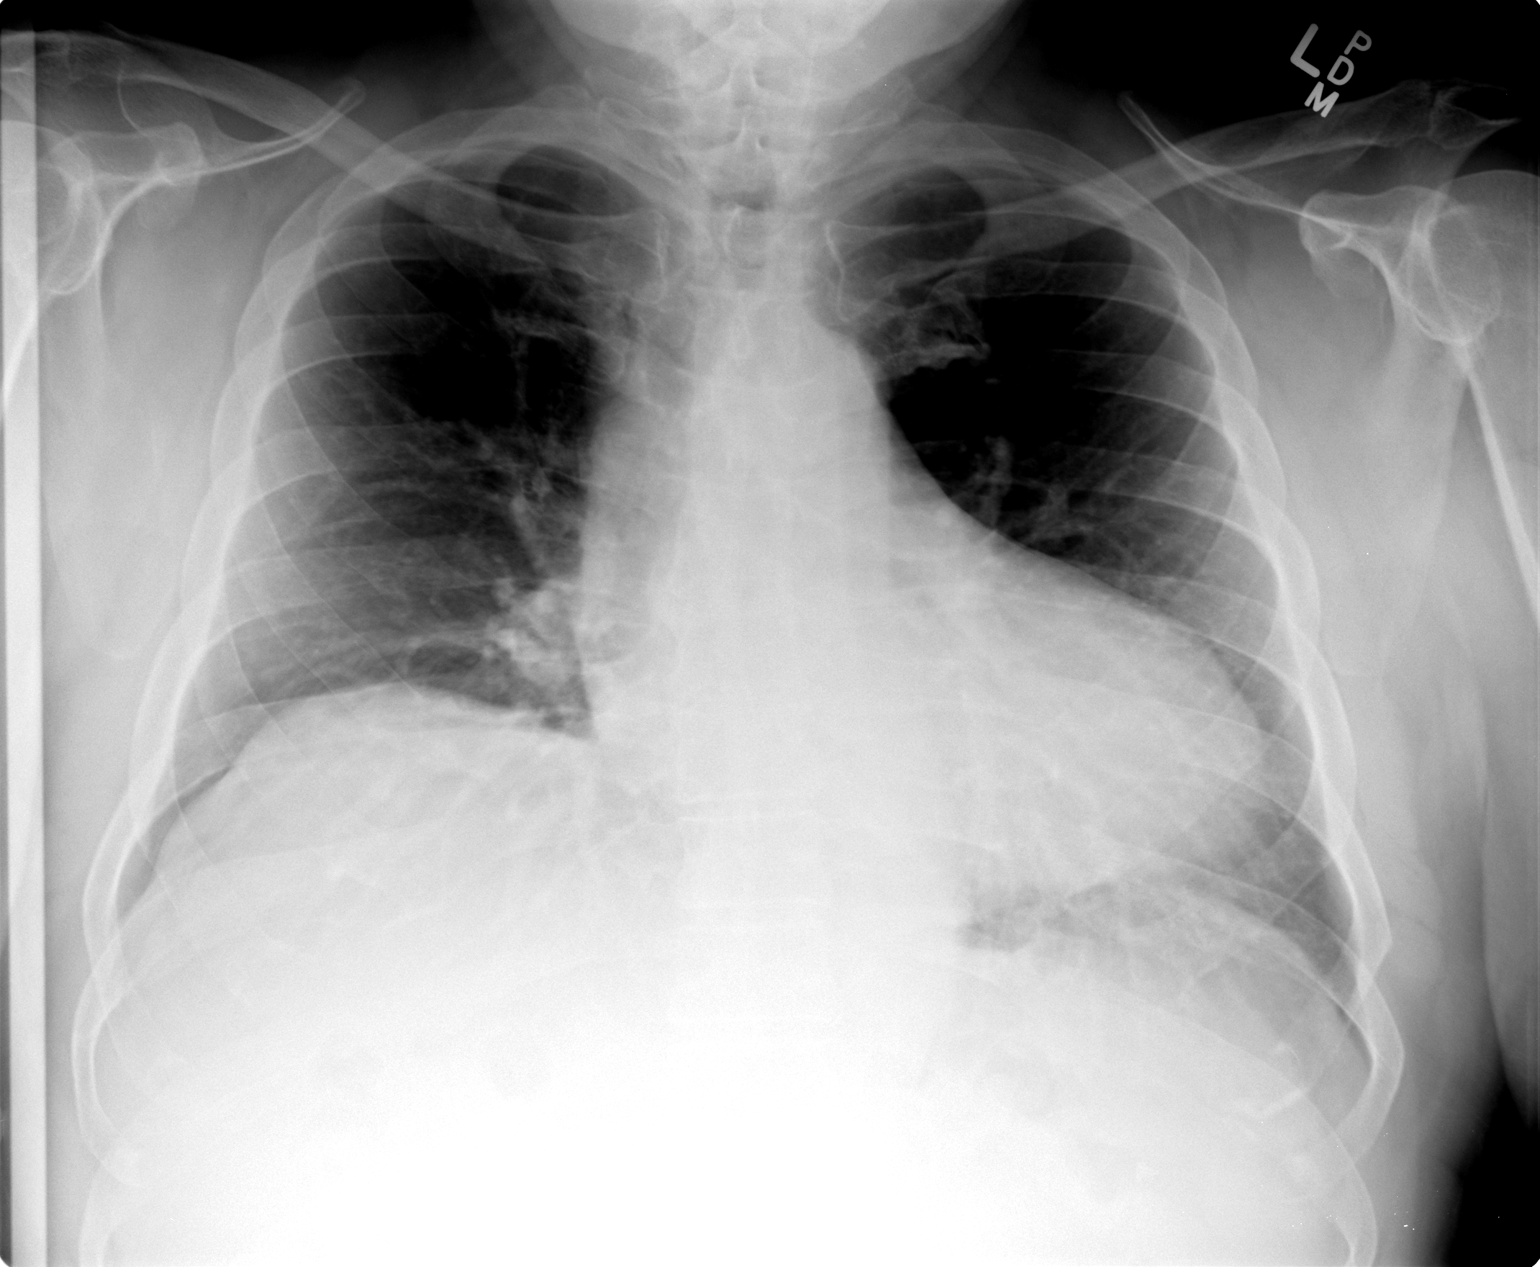

[view not recorded (2 of 2)]
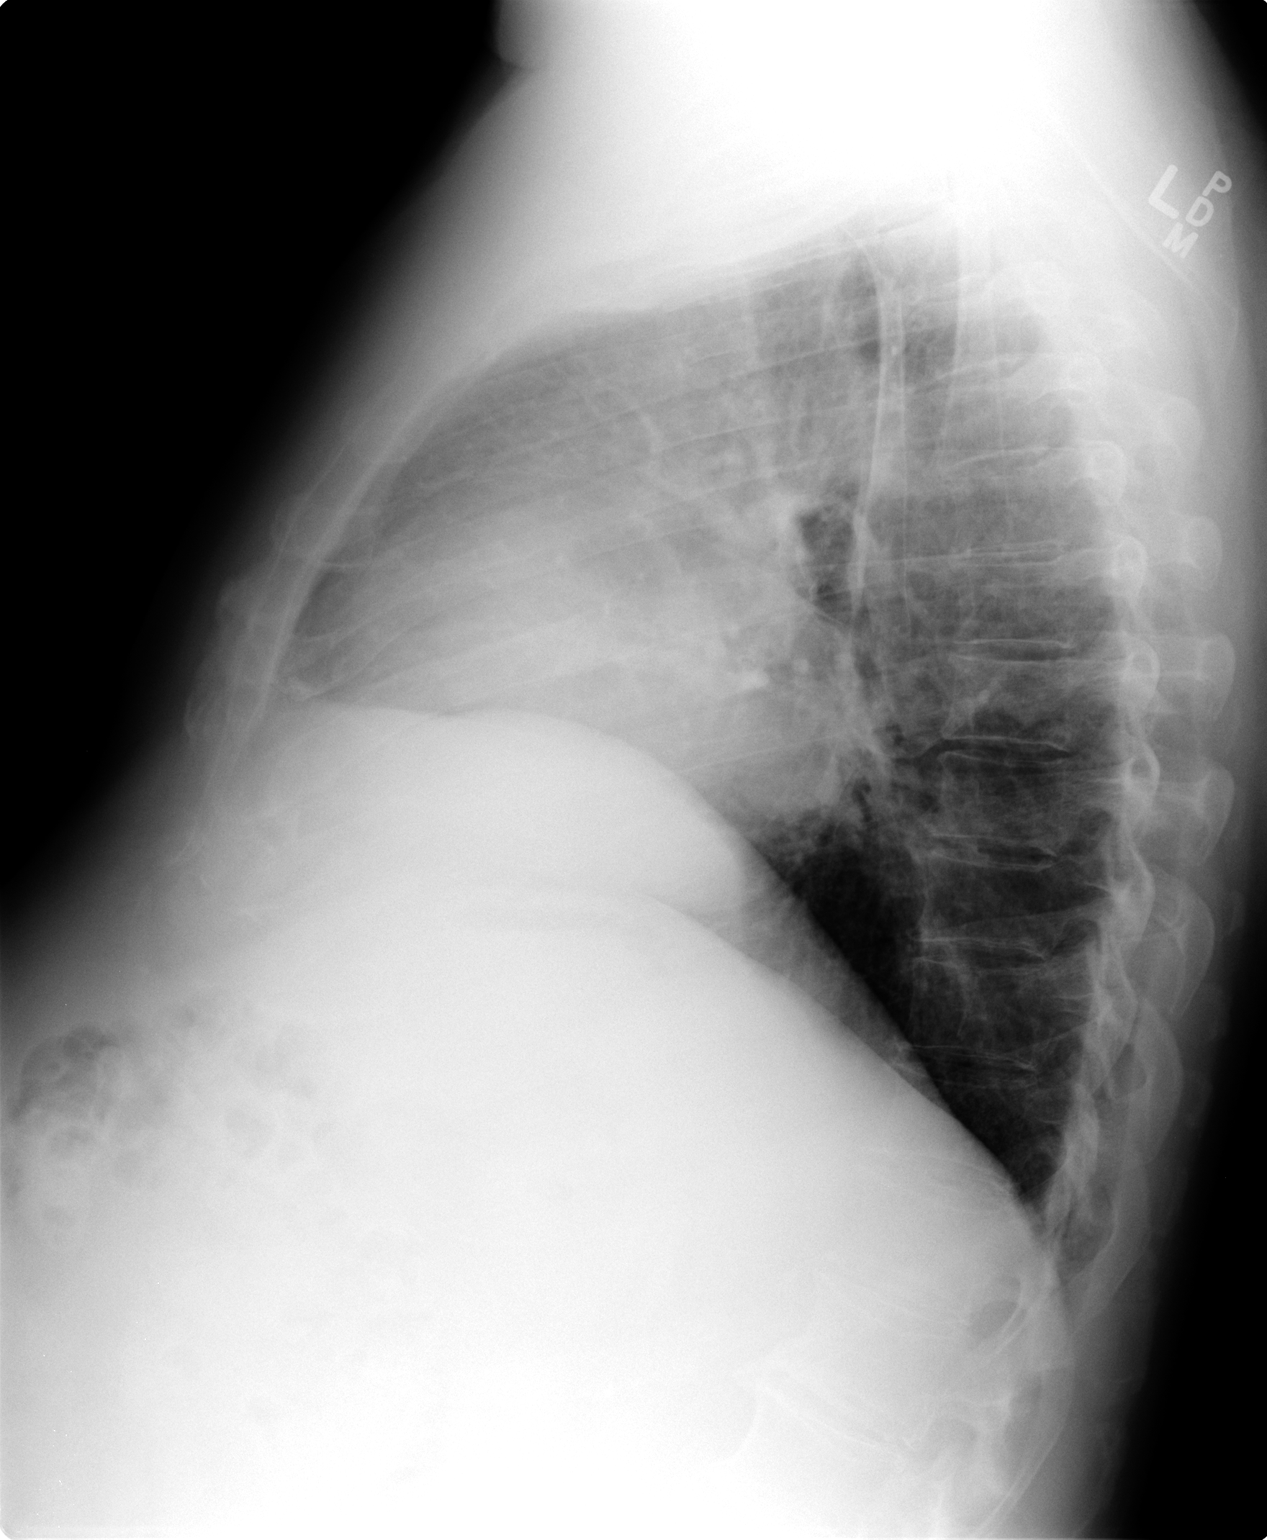

[2 of 2 positions shown; findings below may reference images not displayed]

FINDINGS: Cardiomediastinal silhouette is stable. Mild elevation of the right
hemidiaphragm again noted. No acute infiltrate or pleural effusion.
No pulmonary edema. Bony thorax is stable.
IMPRESSION: No active cardiopulmonary disease.

## 2014-06-02 ENCOUNTER — Encounter: Payer: Self-pay | Admitting: Family Medicine

## 2014-06-02 ENCOUNTER — Ambulatory Visit (INDEPENDENT_AMBULATORY_CARE_PROVIDER_SITE_OTHER): Payer: PRIVATE HEALTH INSURANCE | Admitting: Family Medicine

## 2014-06-02 VITALS — BP 145/95 | HR 63 | Temp 97.5°F | Ht 64.5 in | Wt 205.0 lb

## 2014-06-02 DIAGNOSIS — I482 Chronic atrial fibrillation, unspecified: Secondary | ICD-10-CM

## 2014-06-02 DIAGNOSIS — Z0000 Encounter for general adult medical examination without abnormal findings: Secondary | ICD-10-CM

## 2014-06-02 DIAGNOSIS — G4733 Obstructive sleep apnea (adult) (pediatric): Secondary | ICD-10-CM

## 2014-06-02 DIAGNOSIS — E349 Endocrine disorder, unspecified: Secondary | ICD-10-CM

## 2014-06-02 DIAGNOSIS — E291 Testicular hypofunction: Secondary | ICD-10-CM

## 2014-06-02 DIAGNOSIS — Z23 Encounter for immunization: Secondary | ICD-10-CM

## 2014-06-02 DIAGNOSIS — I10 Essential (primary) hypertension: Secondary | ICD-10-CM

## 2014-06-02 DIAGNOSIS — Z9989 Dependence on other enabling machines and devices: Secondary | ICD-10-CM

## 2014-06-02 LAB — BASIC METABOLIC PANEL
BUN: 19 mg/dL (ref 6–23)
CO2: 33 mEq/L — ABNORMAL HIGH (ref 19–32)
Calcium: 10.2 mg/dL (ref 8.4–10.5)
Chloride: 100 mEq/L (ref 96–112)
Creatinine, Ser: 1.2 mg/dL (ref 0.4–1.5)
GFR: 63.22 mL/min (ref >60.00)
Glucose, Bld: 107 mg/dL — ABNORMAL HIGH (ref 70–99)
Potassium: 4.4 mEq/L (ref 3.5–5.1)
Sodium: 138 mEq/L (ref 135–145)

## 2014-06-02 NOTE — Addendum Note (Signed)
Addended by: Ralph Dowdy on: 06/02/2014 09:17 AM   Modules accepted: Orders

## 2014-06-02 NOTE — Progress Notes (Signed)
OFFICE NOTE  06/02/2014  CC:  Chief Complaint  Patient presents with  . Follow-up    6 months   HPI: Patient is a 68 y.o. Caucasian male who is here for 6 mo f/u HTN, a-fib, hyperlipidemia, hypogonadism. Uses testost about 3 days per week and he feels fine with this. No BP checks from home to report.  He is using CPAP and this has his OSA very well controlled.  Sees specialist at [facility].  Pertinent PMH:  Past medical, surgical, social, and family history reviewed and no changes are noted since last office visit.  MEDS:  Outpatient Prescriptions Prior to Visit  Medication Sig Dispense Refill  . bisoprolol-hydrochlorothiazide (ZIAC) 5-6.25 MG per tablet TAKE 1 TABLET BY MOUTH DAILY.  90 tablet  2  . Calcium Carbonate-Vitamin D (CALCIUM 600+D) 600-200 MG-UNIT TABS Take 1 tablet by mouth 2 (two) times daily.      . clotrimazole-betamethasone (LOTRISONE) cream Apply topically 2 (two) times daily.  30 g  5  . COUMADIN 5 MG tablet TAKE AS DIRECTED  90 tablet  4  . diltiazem (CARDIZEM CD) 180 MG 24 hr capsule TAKE 1 CAPSULE BY MOUTH 2 TIMES DAILY.  180 capsule  3  . Glucosamine-Chondroit-Vit C-Mn (GLUCOSAMINE-CHONDROITIN MAX ST PO) Take 2 capsules by mouth daily.       Marland Kitchen losartan (COZAAR) 50 MG tablet Take 1 tablet (50 mg total) by mouth daily.  90 tablet  1  . Multiple Vitamins-Minerals (MENS MULTIVITAMIN PLUS PO) Take 1 tablet by mouth daily.      . pantoprazole (PROTONIX) 40 MG tablet TAKE 1 TABLET BY MOUTH DAILY.  90 tablet  3  . Testosterone 12.5 MG/ACT (1%) GEL Apply 3 Squirts topically daily. Apply 3 pumps to the skin daily as directed  150 g  3  . warfarin (COUMADIN) 5 MG tablet        No facility-administered medications prior to visit.    PE: Blood pressure 145/95, pulse 63, temperature 97.5 F (36.4 C), temperature source Temporal, height 5' 4.5" (1.638 m), weight 205 lb (92.987 kg), SpO2 91.00%. Gen: Alert, well appearing.  Patient is oriented to person, place, time, and  situation. CV: Irreg irreg, rate 60s, no murmur Chest is clear, no wheezing or rales. Normal symmetric air entry throughout both lung fields. No chest wall deformities or tenderness. EXT: no clubbing, cyanosis, or edema.   LAB: none today  IMPRESSION AND PLAN:  1) HTN; bp up today but no numbers from home. I asked him to monitor at home daily for 1-2 wks and call with these. BMET today.  2) A-fib: continue rate control with cardizem and bisoprolol, anticoag with coumadin (monitored by cardiologist at [facility]).  3) Hypogonadism: takes testost gel infrequently, so no level check is really necessary.  Last Hb 37mo and 1 yr ago both wnl and stable so we'll wait 6 more months to recheck this.  4) OSA: doing MUCH better on CPAP--continue to f/u with Sleep med/neurology at [facility].  5)  Prev health care: prevnar 13 and flu vaccine IM today.  An After Visit Summary was printed and given to the patient.  FOLLOW UP: 6 mo

## 2014-06-02 NOTE — Progress Notes (Signed)
Pre visit review using our clinic review tool, if applicable. No additional management support is needed unless otherwise documented below in the visit note. 

## 2014-06-03 ENCOUNTER — Encounter: Payer: Self-pay | Admitting: Family Medicine

## 2014-06-23 DIAGNOSIS — Z8589 Personal history of malignant neoplasm of other organs and systems: Secondary | ICD-10-CM | POA: Insufficient documentation

## 2014-07-13 ENCOUNTER — Other Ambulatory Visit: Payer: Self-pay | Admitting: Pulmonary Disease

## 2014-07-13 ENCOUNTER — Other Ambulatory Visit: Payer: Self-pay | Admitting: Family Medicine

## 2014-07-15 ENCOUNTER — Telehealth: Payer: Self-pay | Admitting: Family Medicine

## 2014-07-15 NOTE — Telephone Encounter (Signed)
Patient requesting rf of androgel.  Last OV was 06/02/2014.  I don't see that you have ever RX'd this med.  Please advise rf.

## 2014-07-16 NOTE — Telephone Encounter (Signed)
Will authorize RF BUT pt needs lab visit and needs to time visit 4-5 hours after he applies the gel. Needs testosterone level and CBC, dx male hypogonadism.-thx

## 2014-07-16 NOTE — Telephone Encounter (Signed)
Pt aware.  He will call to schedule his lab appointment on a day he can come later.

## 2014-07-30 ENCOUNTER — Other Ambulatory Visit (INDEPENDENT_AMBULATORY_CARE_PROVIDER_SITE_OTHER): Payer: PRIVATE HEALTH INSURANCE

## 2014-07-30 ENCOUNTER — Telehealth: Payer: Self-pay

## 2014-07-30 DIAGNOSIS — E291 Testicular hypofunction: Secondary | ICD-10-CM

## 2014-07-30 LAB — CBC
HEMATOCRIT: 49.3 % (ref 39.0–52.0)
Hemoglobin: 16.7 g/dL (ref 13.0–17.0)
MCHC: 33.9 g/dL (ref 30.0–36.0)
MCV: 91.3 fl (ref 78.0–100.0)
PLATELETS: 187 10*3/uL (ref 150.0–400.0)
RBC: 5.41 Mil/uL (ref 4.22–5.81)
RDW: 13.9 % (ref 11.5–15.5)
WBC: 6.1 10*3/uL (ref 4.0–10.5)

## 2014-07-30 LAB — TESTOSTERONE: Testosterone: 399.15 ng/dL (ref 300.00–890.00)

## 2014-07-30 NOTE — Telephone Encounter (Signed)
Pt came in for blood draw and I believe he is getting a Testosterone level. Can you clarify this for me. Thanks

## 2014-08-04 ENCOUNTER — Encounter: Payer: Self-pay | Admitting: Family Medicine

## 2014-08-31 ENCOUNTER — Telehealth: Payer: Self-pay | Admitting: Family Medicine

## 2014-08-31 MED ORDER — BISOPROLOL-HYDROCHLOROTHIAZIDE 5-6.25 MG PO TABS: 1.0000 | ORAL_TABLET | Freq: Every day | ORAL | Status: DC

## 2014-08-31 NOTE — Telephone Encounter (Signed)
Faxed back to pharmacy requesting they contact Cardiologist.

## 2014-08-31 NOTE — Telephone Encounter (Signed)
Tell pharmacy to request RF of this med from pt's cardiologist at [facility].

## 2014-08-31 NOTE — Telephone Encounter (Signed)
Pt's pharmacy requesting rf of coumadin.  I don't see that you have every prescribed this.  Please advise.

## 2014-12-07 ENCOUNTER — Other Ambulatory Visit: Payer: Self-pay | Admitting: Pulmonary Disease

## 2014-12-17 ENCOUNTER — Other Ambulatory Visit: Payer: Self-pay | Admitting: Family Medicine

## 2014-12-17 MED ORDER — LOSARTAN POTASSIUM 50 MG PO TABS: ORAL_TABLET | ORAL | Status: DC

## 2014-12-17 MED ORDER — PANTOPRAZOLE SODIUM 40 MG PO TBEC: 40.0000 mg | DELAYED_RELEASE_TABLET | Freq: Every day | ORAL | Status: DC

## 2015-03-08 ENCOUNTER — Other Ambulatory Visit: Payer: Self-pay | Admitting: Family Medicine

## 2015-03-08 NOTE — Telephone Encounter (Signed)
RF request for bisoprolol/hctz LOV: 06/12/14 Next ov: None Last written: 08/31/14 #90 w/ 1RF

## 2015-06-09 ENCOUNTER — Other Ambulatory Visit: Payer: Self-pay | Admitting: Family Medicine

## 2015-06-09 NOTE — Telephone Encounter (Signed)
LOV: 06/02/14 NOV: 06/22/15  RF request for bisoprolol/hctz Last written: 03/07/14 #90 w/ 5GC  RF request for pantoprazole Last written: 12/17/14 390 w/ 1RF

## 2015-06-22 ENCOUNTER — Ambulatory Visit (INDEPENDENT_AMBULATORY_CARE_PROVIDER_SITE_OTHER): Payer: PRIVATE HEALTH INSURANCE | Admitting: Family Medicine

## 2015-06-22 ENCOUNTER — Encounter: Payer: Self-pay | Admitting: Family Medicine

## 2015-06-22 VITALS — BP 136/89 | HR 66 | Temp 98.5°F | Resp 16 | Ht 64.5 in | Wt 207.0 lb

## 2015-06-22 DIAGNOSIS — Z23 Encounter for immunization: Secondary | ICD-10-CM | POA: Diagnosis not present

## 2015-06-22 DIAGNOSIS — I10 Essential (primary) hypertension: Secondary | ICD-10-CM

## 2015-06-22 DIAGNOSIS — E291 Testicular hypofunction: Secondary | ICD-10-CM | POA: Diagnosis not present

## 2015-06-22 DIAGNOSIS — Z125 Encounter for screening for malignant neoplasm of prostate: Secondary | ICD-10-CM

## 2015-06-22 DIAGNOSIS — E7800 Pure hypercholesterolemia, unspecified: Secondary | ICD-10-CM | POA: Diagnosis not present

## 2015-06-22 DIAGNOSIS — I482 Chronic atrial fibrillation: Secondary | ICD-10-CM

## 2015-06-22 DIAGNOSIS — R7303 Prediabetes: Secondary | ICD-10-CM

## 2015-06-22 DIAGNOSIS — I4821 Permanent atrial fibrillation: Secondary | ICD-10-CM

## 2015-06-22 HISTORY — DX: Prediabetes: R73.03

## 2015-06-22 LAB — CBC WITH DIFFERENTIAL/PLATELET
BASOS ABS: 0 10*3/uL (ref 0.0–0.1)
Basophils Relative: 0.7 % (ref 0.0–3.0)
Eosinophils Absolute: 0.2 10*3/uL (ref 0.0–0.7)
Eosinophils Relative: 3.1 % (ref 0.0–5.0)
HEMATOCRIT: 52 % (ref 39.0–52.0)
Hemoglobin: 17.7 g/dL — ABNORMAL HIGH (ref 13.0–17.0)
LYMPHS ABS: 1.9 10*3/uL (ref 0.7–4.0)
LYMPHS PCT: 33.1 % (ref 12.0–46.0)
MCHC: 34 g/dL (ref 30.0–36.0)
MCV: 91.7 fl (ref 78.0–100.0)
MONOS PCT: 9.9 % (ref 3.0–12.0)
Monocytes Absolute: 0.6 10*3/uL (ref 0.1–1.0)
NEUTROS PCT: 53.2 % (ref 43.0–77.0)
Neutro Abs: 3.1 10*3/uL (ref 1.4–7.7)
PLATELETS: 181 10*3/uL (ref 150.0–400.0)
RBC: 5.67 Mil/uL (ref 4.22–5.81)
RDW: 13.9 % (ref 11.5–15.5)
WBC: 5.9 10*3/uL (ref 4.0–10.5)

## 2015-06-22 LAB — LIPID PANEL
Cholesterol: 222 mg/dL — ABNORMAL HIGH (ref 0–200)
HDL: 38.8 mg/dL — AB (ref >39.00)
NonHDL: 183.07
Total CHOL/HDL Ratio: 6
Triglycerides: 226 mg/dL — ABNORMAL HIGH (ref 0.0–149.0)
VLDL: 45.2 mg/dL — ABNORMAL HIGH (ref 0.0–40.0)

## 2015-06-22 LAB — COMPREHENSIVE METABOLIC PANEL
ALBUMIN: 4.5 g/dL (ref 3.5–5.2)
ALK PHOS: 56 U/L (ref 39–117)
ALT: 20 U/L (ref 0–53)
AST: 18 U/L (ref 0–37)
BUN: 20 mg/dL (ref 6–23)
CO2: 32 mEq/L (ref 19–32)
Calcium: 9.9 mg/dL (ref 8.4–10.5)
Chloride: 103 mEq/L (ref 96–112)
Creatinine, Ser: 1.2 mg/dL (ref 0.40–1.50)
GFR: 63.64 mL/min (ref >60.00)
Glucose, Bld: 123 mg/dL — ABNORMAL HIGH (ref 70–99)
Potassium: 4.9 mEq/L (ref 3.5–5.1)
Sodium: 142 mEq/L (ref 135–145)
TOTAL PROTEIN: 7.2 g/dL (ref 6.0–8.3)
Total Bilirubin: 1 mg/dL (ref 0.2–1.2)

## 2015-06-22 LAB — TESTOSTERONE: TESTOSTERONE: 269.84 ng/dL — AB (ref 300.00–890.00)

## 2015-06-22 LAB — LDL CHOLESTEROL, DIRECT: LDL DIRECT: 126 mg/dL

## 2015-06-22 MED ORDER — ZOSTER VACCINE LIVE 19400 UNT/0.65ML ~~LOC~~ SOLR: 0.6500 mL | Freq: Once | SUBCUTANEOUS | Status: DC

## 2015-06-22 NOTE — Progress Notes (Signed)
OFFICE VISIT  06/22/2015   CC:  Chief Complaint  Patient presents with  . Follow-up    Pt is fasting.     HPI:    Patient is a 69 y.o. Caucasian male who presents for follow up chronic medical issues.  I last saw him a year ago. Primary probs: HTN, a-fib on coumadin managed by outside cardiologist, hyperlipidemia, hypogonadism, and CRI stage II/III. He sees cardiologist and pulmonologist ([facility]) in [facility] system.  Uses testost gel about 3-4 times per week: usually 1 pump only per application (most recent application was last evening).  Has annual derm appt coming up, also f/u carotid doppler exam.  No acute complaints.  Past Medical History  Diagnosis Date  . Reactive airway disease   . Hypertension   . Coronary artery disease   . Permanent atrial fibrillation ([place]) 2001    failed DCCV per pt  . Peripheral vascular disease ([place])   . Hypercholesteremia   . GERD (gastroesophageal reflux disease)   . IBS (irritable bowel syndrome)   . Hx of colonic polyps   . BPH (benign prostatic hyperplasia)   . Testosterone deficiency   . Scoliosis   . Polycythemia   . OSA on CPAP     Gala Murdoch, NP at Neurology-Sleep Medicine at [facility]  . Chronic renal insufficiency, stage II (mild)     CrCl in the 60s    Past Surgical History  Procedure Laterality Date  . Nasal polyp surgery    . Circumcision    . Colonoscopy w/ polypectomy  05/12/11    recall 5 yrs  . Vasectomy  1983  . Skin cancer excision      Non-malanoma  . Cardiac catheterization  approx 2001    nonobstruc CAD    Outpatient Prescriptions Prior to Visit  Medication Sig Dispense Refill  . bisoprolol-hydrochlorothiazide (ZIAC) 5-6.25 MG tablet TAKE 1 TABLET BY MOUTH DAILY. 90 tablet 0  . Calcium Carbonate-Vitamin D (CALCIUM 600+D) 600-200 MG-UNIT TABS Take 1 tablet by mouth 2 (two) times daily.    Marland Kitchen diltiazem (CARDIZEM CD) 180 MG 24 hr capsule TAKE 1 CAPSULE BY MOUTH 2 TIMES DAILY. 180 capsule 3  .  Glucosamine-Chondroit-Vit C-Mn (GLUCOSAMINE-CHONDROITIN MAX ST PO) Take 2 capsules by mouth daily.     . Multiple Vitamins-Minerals (MENS MULTIVITAMIN PLUS PO) Take 1 tablet by mouth daily.    . pantoprazole (PROTONIX) 40 MG tablet TAKE 1 TABLET BY MOUTH DAILY. 90 tablet 0  . Testosterone 12.5 MG/ACT (1%) GEL Apply 3 Squirts topically daily. Apply 3 pumps to the skin daily as directed 150 g 3  . warfarin (COUMADIN) 5 MG tablet     . clotrimazole-betamethasone (LOTRISONE) cream Apply topically 2 (two) times daily. (Patient not taking: Reported on 06/22/2015) 30 g 5  . COUMADIN 5 MG tablet TAKE AS DIRECTED (Patient not taking: Reported on 06/22/2015) 90 tablet 4  . losartan (COZAAR) 50 MG tablet TAKE 1 TABLET (50 MG) BY MOUTH DAILY. (Patient not taking: Reported on 06/22/2015) 90 tablet 1   No facility-administered medications prior to visit.    Allergies  Allergen Reactions  . Atorvastatin     REACTION: INTOL to Lipitor    ROS As per HPI  PE: Blood pressure 136/89, pulse 66, temperature 98.5 F (36.9 C), temperature source Oral, resp. rate 16, height 5' 4.5" (1.638 m), weight 207 lb (93.895 kg), SpO2 97 %. Gen: Alert, well appearing.  Patient is oriented to person, place, time, and situation. QQI:WLNL: no injection, icteris,  swelling, or exudate.  EOMI, PERRLA. Mouth: lips without lesion/swelling.  Oral mucosa pink and moist. Oropharynx without erythema, exudate, or swelling.  Neck: supple/nontender.  No LAD, mass, or TM.  Carotid pulses 2+ bilaterally, without bruits. CV: Irreg irreg, no m/r/g.   LUNGS: CTA bilat, nonlabored resps, good aeration in all lung fields. ABD: soft, NT, ND, BS normal.  No hepatospenomegaly or mass.  No bruits. EXT: no clubbing, cyanosis, or edema.  DP and PT pulses 2+ bilat.   LABS:  Lab Results  Component Value Date   WBC 6.1 07/30/2014   HGB 16.7 07/30/2014   HCT 49.3 07/30/2014   MCV 91.3 07/30/2014   PLT 187.0 07/30/2014   Lab Results   Component Value Date   TESTOSTERONE 399.15 07/30/2014     Chemistry      Component Value Date/Time   NA 138 06/02/2014 0836   K 4.4 06/02/2014 0836   CL 100 06/02/2014 0836   CO2 33* 06/02/2014 0836   BUN 19 06/02/2014 0836   CREATININE 1.2 06/02/2014 0836      Component Value Date/Time   CALCIUM 10.2 06/02/2014 0836   ALKPHOS 48 12/01/2013 0900   AST 19 12/01/2013 0900   ALT 23 12/01/2013 0900   BILITOT 1.1 12/01/2013 0900     Lab Results  Component Value Date   CHOL 218* 12/01/2013   HDL 41.60 12/01/2013   LDLCALC 131* 12/01/2013   LDLDIRECT 159.5 11/25/2012   TRIG 227.0* 12/01/2013   CHOLHDL 5 12/01/2013   Lab Results  Component Value Date   TSH 0.60 12/01/2013   Lab Results  Component Value Date   PSA 2.53 12/01/2013   PSA 2.30 11/25/2012   PSA 2.56 09/15/2011     IMPRESSION AND PLAN:  1) A-fib, stable.  Continue coumadin/cardiology f/u, meds.  2) HTN; The current medical regimen is effective;  continue present plan and medications. Lytes/cr today.  3) Hyperlipidemia; currently not on statin.  Diet controlled. Check FLP today.  4) Hypogonadism: seems to do well on low dose testost replacement. Monitor testost level and CBC today.  5) Prost ca screening: discussed risks/benefits with pt and he chooses to forego any further PSA monitoring or DRE at this time.  6) Colon ca screening/hx of adenomatous polyps: due for next colonoscopy after 04/2016.  7) Prev health care: flu vaccine today, zostavax rx printed.  An After Visit Summary was printed and given to the patient.  FOLLOW UP: Return in about 1 year (around 06/21/2016) for routine chronic illness f/u (30 min).

## 2015-06-22 NOTE — Progress Notes (Signed)
Pre visit review using our clinic review tool, if applicable. No additional management support is needed unless otherwise documented below in the visit note. 

## 2015-06-23 ENCOUNTER — Encounter: Payer: Self-pay | Admitting: Family Medicine

## 2015-06-23 ENCOUNTER — Other Ambulatory Visit (INDEPENDENT_AMBULATORY_CARE_PROVIDER_SITE_OTHER): Payer: PRIVATE HEALTH INSURANCE

## 2015-06-23 DIAGNOSIS — R7301 Impaired fasting glucose: Secondary | ICD-10-CM

## 2015-06-23 LAB — HEMOGLOBIN A1C: Hgb A1c MFr Bld: 5.9 % (ref 4.6–6.5)

## 2015-07-19 ENCOUNTER — Other Ambulatory Visit: Payer: Self-pay | Admitting: Family Medicine

## 2015-08-31 ENCOUNTER — Other Ambulatory Visit: Payer: Self-pay | Admitting: Family Medicine

## 2015-08-31 DIAGNOSIS — E291 Testicular hypofunction: Secondary | ICD-10-CM

## 2015-08-31 DIAGNOSIS — D751 Secondary polycythemia: Secondary | ICD-10-CM

## 2015-08-31 NOTE — Telephone Encounter (Signed)
Pt advised and voiced understanding.  Future order put in EMR.

## 2015-08-31 NOTE — Telephone Encounter (Signed)
Left message for pt to call back  °

## 2015-08-31 NOTE — Telephone Encounter (Signed)
Testosterone RF'd but pt needs lab visit for CBC no diff in 4 months, dx polycythemia and male hypogonadism.-thx

## 2015-08-31 NOTE — Telephone Encounter (Signed)
RF request for testosterone LOV: 06/22/15 Next ov: 06/21/16 Last written: 05/02/13 150g w/ 3RF  Please advise. Thanks.    ----------------------------------------------------  The following medications have been filled already.   RF request for bisoprolol/hctz Last written: 06/09/15 #90 w/ 0RF  RF request for pantoprazole Last written: 06/09/15 #90 w/ 0RF

## 2015-08-31 NOTE — Addendum Note (Signed)
Addended by: Lanae Crumbly on: 08/31/2015 10:39 AM   Modules accepted: Orders

## 2015-12-07 ENCOUNTER — Other Ambulatory Visit (INDEPENDENT_AMBULATORY_CARE_PROVIDER_SITE_OTHER): Payer: PRIVATE HEALTH INSURANCE

## 2015-12-07 DIAGNOSIS — E291 Testicular hypofunction: Secondary | ICD-10-CM

## 2015-12-07 DIAGNOSIS — D751 Secondary polycythemia: Secondary | ICD-10-CM

## 2015-12-07 LAB — CBC WITH DIFFERENTIAL/PLATELET
BASOS ABS: 0 {cells}/uL (ref 0–200)
Basophils Relative: 0 %
Eosinophils Absolute: 58 cells/uL (ref 15–500)
Eosinophils Relative: 1 %
HEMATOCRIT: 49.8 % (ref 38.5–50.0)
HEMOGLOBIN: 17.6 g/dL — AB (ref 13.2–17.1)
LYMPHS ABS: 1624 {cells}/uL (ref 850–3900)
LYMPHS PCT: 28 %
MCH: 31.6 pg (ref 27.0–33.0)
MCHC: 35.3 g/dL (ref 32.0–36.0)
MCV: 89.4 fL (ref 80.0–100.0)
MONO ABS: 464 {cells}/uL (ref 200–950)
MPV: 9.9 fL (ref 7.5–12.5)
Monocytes Relative: 8 %
NEUTROS PCT: 63 %
Neutro Abs: 3654 cells/uL (ref 1500–7800)
Platelets: 182 10*3/uL (ref 140–400)
RBC: 5.57 MIL/uL (ref 4.20–5.80)
RDW: 13.6 % (ref 11.0–15.0)
WBC: 5.8 10*3/uL (ref 3.8–10.8)

## 2016-01-18 ENCOUNTER — Other Ambulatory Visit: Payer: Self-pay | Admitting: Family Medicine

## 2016-01-18 NOTE — Telephone Encounter (Signed)
RF request for losartan LOV: 06/22/15 Next ov: 06/21/16 Last written: 07/19/15 #90 w/ [id number]

## 2016-06-21 ENCOUNTER — Ambulatory Visit (INDEPENDENT_AMBULATORY_CARE_PROVIDER_SITE_OTHER): Payer: PRIVATE HEALTH INSURANCE | Admitting: Family Medicine

## 2016-06-21 ENCOUNTER — Encounter: Payer: Self-pay | Admitting: Family Medicine

## 2016-06-21 VITALS — BP 157/83 | HR 64 | Temp 97.2°F | Resp 18 | Ht 65.0 in | Wt 216.8 lb

## 2016-06-21 DIAGNOSIS — N182 Chronic kidney disease, stage 2 (mild): Secondary | ICD-10-CM | POA: Diagnosis not present

## 2016-06-21 DIAGNOSIS — E7800 Pure hypercholesterolemia, unspecified: Secondary | ICD-10-CM

## 2016-06-21 DIAGNOSIS — Z23 Encounter for immunization: Secondary | ICD-10-CM

## 2016-06-21 DIAGNOSIS — I482 Chronic atrial fibrillation, unspecified: Secondary | ICD-10-CM

## 2016-06-21 DIAGNOSIS — I10 Essential (primary) hypertension: Secondary | ICD-10-CM | POA: Diagnosis not present

## 2016-06-21 DIAGNOSIS — Z0000 Encounter for general adult medical examination without abnormal findings: Secondary | ICD-10-CM

## 2016-06-21 DIAGNOSIS — E291 Testicular hypofunction: Secondary | ICD-10-CM | POA: Diagnosis not present

## 2016-06-21 LAB — LIPID PANEL
Cholesterol: 222 mg/dL — ABNORMAL HIGH (ref 0–200)
HDL: 40.3 mg/dL (ref >39.00)
NONHDL: 181.49
Total CHOL/HDL Ratio: 6
Triglycerides: 261 mg/dL — ABNORMAL HIGH (ref 0.0–149.0)
VLDL: 52.2 mg/dL — ABNORMAL HIGH (ref 0.0–40.0)

## 2016-06-21 LAB — TESTOSTERONE: TESTOSTERONE: 248.56 ng/dL — AB (ref 300.00–890.00)

## 2016-06-21 LAB — COMPREHENSIVE METABOLIC PANEL
ALT: 25 U/L (ref 0–53)
AST: 21 U/L (ref 0–37)
Albumin: 4.5 g/dL (ref 3.5–5.2)
Alkaline Phosphatase: 52 U/L (ref 39–117)
BUN: 20 mg/dL (ref 6–23)
CHLORIDE: 102 meq/L (ref 96–112)
CO2: 29 meq/L (ref 19–32)
CREATININE: 1.28 mg/dL (ref 0.40–1.50)
Calcium: 10.1 mg/dL (ref 8.4–10.5)
GFR: 58.9 mL/min — ABNORMAL LOW (ref >60.00)
GLUCOSE: 117 mg/dL — AB (ref 70–99)
Potassium: 4.5 mEq/L (ref 3.5–5.1)
SODIUM: 141 meq/L (ref 135–145)
Total Bilirubin: 1 mg/dL (ref 0.2–1.2)
Total Protein: 7.4 g/dL (ref 6.0–8.3)

## 2016-06-21 LAB — CBC WITH DIFFERENTIAL/PLATELET
BASOS PCT: 0.6 % (ref 0.0–3.0)
Basophils Absolute: 0 10*3/uL (ref 0.0–0.1)
EOS ABS: 0.2 10*3/uL (ref 0.0–0.7)
Eosinophils Relative: 3.2 % (ref 0.0–5.0)
HCT: 51.9 % (ref 39.0–52.0)
LYMPHS ABS: 1.6 10*3/uL (ref 0.7–4.0)
Lymphocytes Relative: 27.2 % (ref 12.0–46.0)
MCHC: 34.7 g/dL (ref 30.0–36.0)
MCV: 91.2 fl (ref 78.0–100.0)
MONO ABS: 0.5 10*3/uL (ref 0.1–1.0)
Monocytes Relative: 8.8 % (ref 3.0–12.0)
NEUTROS ABS: 3.6 10*3/uL (ref 1.4–7.7)
NEUTROS PCT: 60.2 % (ref 43.0–77.0)
PLATELETS: 188 10*3/uL (ref 150.0–400.0)
RBC: 5.69 Mil/uL (ref 4.22–5.81)
RDW: 13.8 % (ref 11.5–15.5)
WBC: 6 10*3/uL (ref 4.0–10.5)

## 2016-06-21 LAB — PSA: PSA: 2.15 ng/mL (ref 0.10–4.00)

## 2016-06-21 LAB — LDL CHOLESTEROL, DIRECT: LDL DIRECT: 131 mg/dL

## 2016-06-21 MED ORDER — TESTOSTERONE 12.5 MG/ACT (1%) TD GEL: TRANSDERMAL | 5 refills | Status: DC

## 2016-06-21 NOTE — Progress Notes (Signed)
Pre visit review using our clinic review tool, if applicable. No additional management support is needed unless otherwise documented below in the visit note. 

## 2016-06-21 NOTE — Patient Instructions (Signed)
[facility] system, Gastroenterology department--about scheduling your repeat colonoscopy.

## 2016-06-21 NOTE — Addendum Note (Signed)
Addended by: Gordy Councilman on: 06/21/2016 08:47 AM   Modules accepted: Orders

## 2016-06-21 NOTE — Progress Notes (Signed)
OFFICE VISIT  06/21/2016   CC:  Chief Complaint  Patient presents with  . Annual Exam    CPE     HPI:    Patient is a 70 y.o.  male who presents for 1 yr f/u HTN, a-fib on coumadin managed by outside cardiologist, HLD, hypogonadism, and CRI stage II/III. No acute complaints.  Gained a few lbs since last visit, not exercising.  Not eating a very low cal diet.  Most recent application of testosterone gel was 2 pumps about 4 days ago.  BP: home monitoring avg 140/90.  Has cardiology f/u appt 08/2015.  Still on coumadin, gets INR checked q 6 wks.  HLD: he has declined statin trial last f/u visit b/c they have made his joints ache.  I reminded him of need for repeat colonoscopy: due now through [facility].  Past Medical History:  Diagnosis Date  . BPH (benign prostatic hyperplasia)   . Chronic renal insufficiency, stage II (mild)    CrCl in the 60s  . Coronary artery disease   . GERD (gastroesophageal reflux disease)   . Hx of colonic polyps   . Hypercholesteremia    Pt refuses statins  . Hypertension   . IBS (irritable bowel syndrome)   . IFG (impaired fasting glucose) 06/22/15   123.  A1c was 5.9%  . OSA on CPAP    Gala Murdoch, NP at Neurology-Sleep Medicine at [facility]  . Peripheral vascular disease ([place])   . Permanent atrial fibrillation ([place]) 2001   failed DCCV per pt  . Polycythemia   . Reactive airway disease   . Scoliosis   . Testosterone deficiency     Past Surgical History:  Procedure Laterality Date  . CARDIAC CATHETERIZATION  approx 2001   nonobstruc CAD  . CIRCUMCISION    . COLONOSCOPY W/ POLYPECTOMY  05/12/11   recall 5 yrs  . NASAL POLYP SURGERY    . SKIN CANCER EXCISION     Non-malanoma  . VASECTOMY  1983    Outpatient Medications Prior to Visit  Medication Sig Dispense Refill  . bisoprolol-hydrochlorothiazide (ZIAC) 5-6.25 MG tablet TAKE 1 TABLET BY MOUTH DAILY. 90 tablet 3  . Calcium Carbonate-Vitamin D (CALCIUM 600+D)  600-200 MG-UNIT TABS Take 1 tablet by mouth 2 (two) times daily.    Marland Kitchen diltiazem (CARDIZEM CD) 180 MG 24 hr capsule TAKE 1 CAPSULE BY MOUTH 2 TIMES DAILY. 180 capsule 3  . Glucosamine-Chondroit-Vit C-Mn (GLUCOSAMINE-CHONDROITIN MAX ST PO) Take 2 capsules by mouth daily.     Marland Kitchen losartan (COZAAR) 50 MG tablet TAKE 1 TABLET BY MOUTH DAILY. 90 tablet 3  . Multiple Vitamins-Minerals (MENS MULTIVITAMIN PLUS PO) Take 1 tablet by mouth daily.    . pantoprazole (PROTONIX) 40 MG tablet TAKE 1 TABLET BY MOUTH DAILY. 90 tablet 3  . telmisartan (MICARDIS) 40 MG tablet Take 40 mg by mouth daily.    Marland Kitchen warfarin (COUMADIN) 5 MG tablet     . Testosterone 12.5 MG/ACT (1%) GEL APPLY 3 PUMPS TO SKIN DAILY AS DIRECTED 150 g 5  . zoster vaccine live, PF, (ZOSTAVAX) 29562 UNT/0.65ML injection Inject 19,400 Units into the skin once. (Patient not taking: Reported on 06/21/2016) 1 vial 0   No facility-administered medications prior to visit.     Allergies  Allergen Reactions  . Lac Bovis Other (See Comments)    Causes reflux if consumed in the pm.  . Atorvastatin     REACTION: INTOL to Lipitor  . Pollen Extract Other (  See Comments)    Stuffy nose    ROS As per HPI  PE: Blood pressure (!) 157/83, pulse 64, temperature 97.2 F (36.2 C), temperature source Temporal, resp. rate 18, height 5\' 5"  (1.651 m), weight 216 lb 12.8 oz (98.3 kg), SpO2 95 %. Review of Systems  Constitutional: Negative for fatigue and fever.  HENT: Negative for congestion and sore throat.   Eyes: Negative for visual disturbance.  Respiratory: Negative for cough.   Cardiovascular: Negative for chest pain.  Gastrointestinal: Negative for abdominal pain and nausea.  Genitourinary: Negative for dysuria.  Musculoskeletal: Negative for back pain and joint swelling.  Skin: Negative for rash.  Neurological: Negative for weakness and headaches.  Hematological: Negative for adenopathy.   Gen: Alert, well appearing.  Patient is oriented to  person, place, time, and situation. AFFECT: pleasant, lucid thought and speech. ENT: Ears: EACs clear, normal epithelium.  TMs with good light reflex and landmarks bilaterally.  Eyes: no injection, icteris, swelling, or exudate.  EOMI, PERRLA. Nose: no drainage or turbinate edema/swelling.  No injection or focal lesion.  Mouth: lips without lesion/swelling.  Oral mucosa pink and moist.  Dentition intact and without obvious caries or gingival swelling.  Oropharynx without erythema, exudate, or swelling.  Neck: supple/nontender.  No LAD, mass, or TM.  Carotid pulses 2+ bilaterally, without bruits. CV: rhythm irregular, no m/r/g.   LUNGS: CTA bilat, nonlabored resps, good aeration in all lung fields. ABD: soft, NT, ND, BS normal.  No hepatospenomegaly or mass.  No bruits. EXT: no clubbing or cyanosis.  Trace to 1+ pitting LE edema bilat.   LABS:  Lab Results  Component Value Date   TSH 0.60 12/01/2013   Lab Results  Component Value Date   WBC 5.8 12/07/2015   HGB 17.6 (H) 12/07/2015   HCT 49.8 12/07/2015   MCV 89.4 12/07/2015   PLT 182 12/07/2015   Lab Results  Component Value Date   CREATININE 1.20 06/22/2015   BUN 20 06/22/2015   NA 142 06/22/2015   K 4.9 06/22/2015   CL 103 06/22/2015   CO2 32 06/22/2015   Lab Results  Component Value Date   ALT 20 06/22/2015   AST 18 06/22/2015   ALKPHOS 56 06/22/2015   BILITOT 1.0 06/22/2015   Lab Results  Component Value Date   CHOL 222 (H) 06/22/2015   Lab Results  Component Value Date   HDL 38.80 (L) 06/22/2015   Lab Results  Component Value Date   LDLCALC 131 (H) 12/01/2013   Lab Results  Component Value Date   TRIG 226.0 (H) 06/22/2015   Lab Results  Component Value Date   CHOLHDL 6 06/22/2015   Lab Results  Component Value Date   PSA 2.53 12/01/2013   PSA 2.30 11/25/2012   PSA 2.56 09/15/2011   Lab Results  Component Value Date   HGBA1C 5.9 06/23/2015    IMPRESSION AND PLAN:  1) A-fib; stable on rate  control and coumadin.  Keep f/u with cardiology.  2) HTN: The current medical regimen is effective;  continue present plan and medications. Lytes/cr today.  3) HLD: statin intolerant in the past, but pt says if cholesterol is "really bad" he would be open to another med trial.  Check FLP and AST/ALT today.  4) Hypogonadism: not using testosterone regularly. Check testost level, CBC, and PSA today.  5) Colon ca screening/hx of adenomatous polyps: due for repeat colonoscopy (04/2016): I reminded him to contact [place] department to schedule  this.  6) Prev health : flu vaccine today.    An After Visit Summary was printed and given to the patient.  FOLLOW UP: Return in about 6 months (around 12/20/2016) for routine chronic illness f/u.  Signed:  Crissie Sickles, MD           06/21/2016

## 2016-06-22 ENCOUNTER — Other Ambulatory Visit (INDEPENDENT_AMBULATORY_CARE_PROVIDER_SITE_OTHER): Payer: PRIVATE HEALTH INSURANCE

## 2016-06-22 DIAGNOSIS — R7301 Impaired fasting glucose: Secondary | ICD-10-CM | POA: Diagnosis not present

## 2016-06-22 LAB — HEMOGLOBIN A1C: HEMOGLOBIN A1C: 6 % (ref 4.6–6.5)

## 2016-06-23 ENCOUNTER — Telehealth: Payer: Self-pay | Admitting: Family Medicine

## 2016-06-23 NOTE — Telephone Encounter (Signed)
Please call patient on his cell to give results.

## 2016-06-26 ENCOUNTER — Encounter: Payer: Self-pay | Admitting: Family Medicine

## 2016-06-26 NOTE — Telephone Encounter (Signed)
This patient walked into the office because he said his home phone isn't working.  Can you please call him at [phone] to advise him of his labs?

## 2016-06-27 NOTE — Telephone Encounter (Signed)
I called pt 06/26/16 and discussed his labs with him.  Pls see result note attached to his most recent labs for details.

## 2016-08-28 ENCOUNTER — Other Ambulatory Visit: Payer: Self-pay | Admitting: Family Medicine

## 2016-10-09 ENCOUNTER — Other Ambulatory Visit: Payer: Self-pay | Admitting: Family Medicine

## 2016-10-09 NOTE — Telephone Encounter (Signed)
Pt advised and voiced understanding.  Rx faxed.  

## 2016-10-09 NOTE — Telephone Encounter (Signed)
[employer].  RF request for testosterone LOV: 06/21/16 Next ov: 12/20/16 Last written: 06/21/16 #150g w/ 5RF  Please advise. Thanks.

## 2016-10-09 NOTE — Telephone Encounter (Signed)
Left message for pt to call back  °

## 2016-10-09 NOTE — Telephone Encounter (Signed)
I'm authorizing RF of pt's testosterone, but make sure patient is still only taking this 3 days per week.-thx

## 2016-10-17 ENCOUNTER — Ambulatory Visit: Payer: PRIVATE HEALTH INSURANCE | Admitting: Physician Assistant

## 2016-12-20 ENCOUNTER — Ambulatory Visit (INDEPENDENT_AMBULATORY_CARE_PROVIDER_SITE_OTHER): Payer: PRIVATE HEALTH INSURANCE | Admitting: Family Medicine

## 2016-12-20 ENCOUNTER — Encounter: Payer: Self-pay | Admitting: Family Medicine

## 2016-12-20 VITALS — BP 122/90 | HR 73 | Temp 98.2°F | Resp 16 | Ht 65.0 in | Wt 211.2 lb

## 2016-12-20 DIAGNOSIS — D751 Secondary polycythemia: Secondary | ICD-10-CM

## 2016-12-20 DIAGNOSIS — I10 Essential (primary) hypertension: Secondary | ICD-10-CM | POA: Diagnosis not present

## 2016-12-20 DIAGNOSIS — Z1211 Encounter for screening for malignant neoplasm of colon: Secondary | ICD-10-CM | POA: Diagnosis not present

## 2016-12-20 DIAGNOSIS — R7301 Impaired fasting glucose: Secondary | ICD-10-CM

## 2016-12-20 DIAGNOSIS — Z86010 Personal history of colonic polyps: Secondary | ICD-10-CM

## 2016-12-20 LAB — CBC WITH DIFFERENTIAL/PLATELET
Basophils Absolute: 0.1 10*3/uL (ref 0.0–0.1)
Basophils Relative: 1.3 % (ref 0.0–3.0)
EOS ABS: 0.1 10*3/uL (ref 0.0–0.7)
Eosinophils Relative: 2.2 % (ref 0.0–5.0)
HCT: 49.7 % (ref 39.0–52.0)
HEMOGLOBIN: 17 g/dL (ref 13.0–17.0)
Lymphocytes Relative: 29.7 % (ref 12.0–46.0)
Lymphs Abs: 1.5 10*3/uL (ref 0.7–4.0)
MCHC: 34.2 g/dL (ref 30.0–36.0)
MCV: 93.1 fl (ref 78.0–100.0)
MONO ABS: 0.4 10*3/uL (ref 0.1–1.0)
Monocytes Relative: 7.5 % (ref 3.0–12.0)
Neutro Abs: 3.1 10*3/uL (ref 1.4–7.7)
Neutrophils Relative %: 59.3 % (ref 43.0–77.0)
Platelets: 185 10*3/uL (ref 150.0–400.0)
RBC: 5.34 Mil/uL (ref 4.22–5.81)
RDW: 14.3 % (ref 11.5–15.5)
WBC: 5.2 10*3/uL (ref 4.0–10.5)

## 2016-12-20 LAB — BASIC METABOLIC PANEL
BUN: 19 mg/dL (ref 6–23)
CHLORIDE: 102 meq/L (ref 96–112)
CO2: 31 meq/L (ref 19–32)
Calcium: 9.7 mg/dL (ref 8.4–10.5)
Creatinine, Ser: 1.21 mg/dL (ref 0.40–1.50)
GFR: 62.76 mL/min (ref >60.00)
Glucose, Bld: 132 mg/dL — ABNORMAL HIGH (ref 70–99)
POTASSIUM: 4.5 meq/L (ref 3.5–5.1)
Sodium: 140 mEq/L (ref 135–145)

## 2016-12-20 NOTE — Progress Notes (Signed)
Pre visit review using our clinic review tool, if applicable. No additional management support is needed unless otherwise documented below in the visit note. 

## 2016-12-20 NOTE — Progress Notes (Signed)
OFFICE VISIT  12/20/2016   CC:  Chief Complaint  Patient presents with  . Follow-up    RCI, pt is not fasting.   HPI:    Patient is a 71 y.o.  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 No acute complaints.  Compliant with meds.  HTN: avg 130s/80s-90s  No HR monitoring.  CRI II: he does not take any NSAIDs.   Tries to drink plenty of clear fluids.  IFG: not working on anything from dietary standpoint.  Not exercising.  Not motivated to exercise.  ROS: no melena or hematochezia, no gross hematuria, no excessive bruising.  No HAs, no dizziness, no CP, no SOB.  Past Medical History:  Diagnosis Date  . BPH (benign prostatic hyperplasia)   . Chronic renal insufficiency, stage II (mild)    CrCl in the 60s  . Coronary artery disease   . GERD (gastroesophageal reflux disease)   . Hx of colonic polyps   . Hypercholesteremia    Pt refuses statins (as of 05/2016)  . Hypertension   . IBS (irritable bowel syndrome)   . IFG (impaired fasting glucose) 06/22/2015   123.  A1c was 5.9%.  Stable at 6.0% 05/2016.  . OSA on CPAP    Gala Murdoch, NP at Neurology-Sleep Medicine at [facility]  . Peripheral vascular disease ([place])   . Permanent atrial fibrillation ([place]) 2001   failed DCCV per pt  . Polycythemia    Pt states he had w/u for hemachromatosis but it was NEG.  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  . Prostate cancer screening    Pt declines any further prostate ca screening as of 05/2015.  Marland Kitchen Reactive airway disease   . Scoliosis   . Testosterone deficiency     Past Surgical History:  Procedure Laterality Date  . CARDIAC CATHETERIZATION  approx 2001   nonobstruc CAD  . CIRCUMCISION    . COLONOSCOPY W/ POLYPECTOMY  05/12/11   recall 5 yrs  . NASAL POLYP SURGERY    . SKIN CANCER EXCISION     Non-malanoma  . VASECTOMY  1983    Outpatient Medications Prior to Visit  Medication  Sig Dispense Refill  . bisoprolol-hydrochlorothiazide (ZIAC) 5-6.25 MG tablet TAKE 1 TABLET BY MOUTH DAILY. 90 tablet 3  . Calcium Carbonate-Vitamin D (CALCIUM 600+D) 600-200 MG-UNIT TABS Take 1 tablet by mouth 2 (two) times daily.    Marland Kitchen diltiazem (CARDIZEM CD) 180 MG 24 hr capsule TAKE 1 CAPSULE BY MOUTH 2 TIMES DAILY. 180 capsule 3  . Glucosamine-Chondroit-Vit C-Mn (GLUCOSAMINE-CHONDROITIN MAX ST PO) Take 2 capsules by mouth daily.     Marland Kitchen losartan (COZAAR) 50 MG tablet TAKE 1 TABLET BY MOUTH DAILY. 90 tablet 3  . Multiple Vitamins-Minerals (MENS MULTIVITAMIN PLUS PO) Take 1 tablet by mouth daily.    . pantoprazole (PROTONIX) 40 MG tablet TAKE 1 TABLET BY MOUTH DAILY. 90 tablet 3  . telmisartan (MICARDIS) 40 MG tablet Take 40 mg by mouth daily.    . Testosterone 12.5 MG/ACT (1%) GEL APPLY 3 PUMPS ONTO SKIN DAILY AS DIRECTED 150 g 5  . warfarin (COUMADIN) 5 MG tablet     . zoster vaccine live, PF, (ZOSTAVAX) 77412 UNT/0.65ML injection Inject 19,400 Units into the skin once. (Patient not taking: Reported on 06/21/2016) 1 vial 0   No facility-administered medications prior to visit.     Allergies  Allergen Reactions  . Lac  Bovis Other (See Comments)    Causes reflux if consumed in the pm.  . Atorvastatin     REACTION: INTOL to Lipitor  . Pollen Extract Other (See Comments)    Stuffy nose    ROS As per HPI  PE: Blood pressure 122/90, pulse 73, temperature 98.2 F (36.8 C), temperature source Oral, resp. rate 16, height 5\' 5"  (1.651 m), weight 211 lb 4 oz (95.8 kg), SpO2 95 %. Gen: Alert, well appearing.  Patient is oriented to person, place, time, and situation. AFFECT: pleasant, lucid thought and speech. No further exam today.  LABS:    Lab Results  Component Value Date   TSH 0.60 12/01/2013   Lab Results  Component Value Date   WBC 6.0 06/21/2016   HGB 18.0 Repeated and verified X2. ([place]) 06/21/2016   HCT 51.9 06/21/2016   MCV 91.2 06/21/2016   PLT 188.0 06/21/2016    Lab Results  Component Value Date   CREATININE 1.28 06/21/2016   BUN 20 06/21/2016   NA 141 06/21/2016   K 4.5 06/21/2016   CL 102 06/21/2016   CO2 29 06/21/2016   Lab Results  Component Value Date   ALT 25 06/21/2016   AST 21 06/21/2016   ALKPHOS 52 06/21/2016   BILITOT 1.0 06/21/2016   Lab Results  Component Value Date   CHOL 222 (H) 06/21/2016   Lab Results  Component Value Date   HDL 40.30 06/21/2016   Lab Results  Component Value Date   LDLCALC 131 (H) 12/01/2013   Lab Results  Component Value Date   TRIG 261.0 (H) 06/21/2016   Lab Results  Component Value Date   CHOLHDL 6 06/21/2016   Lab Results  Component Value Date   PSA 2.15 06/21/2016   PSA 2.53 12/01/2013   PSA 2.30 11/25/2012   Lab Results  Component Value Date   HGBA1C 6.0 06/22/2016   Lab Results  Component Value Date   TESTOSTERONE 248.56 (L) 06/21/2016   IMPRESSION AND PLAN:  1) HTN; The current medical regimen is effective;  continue present plan and medications. Check lytes/cr today.  2) Secondary polycythemia: monitor CBC today.  3) IFG: strongly encouraged pt to try to eat low carb/low fat diet, exercise much more. He is not fasting today. Will repeat fasting gluc and Hba1c at next f/u in 6 mo.  4) Colon ca screening/hx of adenomatous colon polyp: referral made for him to get repeat colonoscopy at the same place he got his last one ([facility]).  An After Visit Summary was printed and given to the patient.  FOLLOW UP: Return in about 6 months (around 06/21/2017) for routine chronic illness f/u.  Repeat testosterone, BMET, CBC next o/v.  Signed:  Crissie Sickles, MD           12/20/2016

## 2017-04-27 LAB — HM COLONOSCOPY

## 2017-05-09 ENCOUNTER — Encounter: Payer: Self-pay | Admitting: Family Medicine

## 2017-05-17 ENCOUNTER — Encounter: Payer: Self-pay | Admitting: Family Medicine

## 2017-05-25 ENCOUNTER — Other Ambulatory Visit: Payer: Self-pay | Admitting: Family Medicine

## 2017-06-22 ENCOUNTER — Ambulatory Visit (INDEPENDENT_AMBULATORY_CARE_PROVIDER_SITE_OTHER): Payer: PRIVATE HEALTH INSURANCE | Admitting: Family Medicine

## 2017-06-22 ENCOUNTER — Encounter: Payer: Self-pay | Admitting: Family Medicine

## 2017-06-22 VITALS — BP 138/90 | HR 63 | Temp 98.4°F | Resp 16 | Ht 65.0 in | Wt 213.5 lb

## 2017-06-22 DIAGNOSIS — R7301 Impaired fasting glucose: Secondary | ICD-10-CM

## 2017-06-22 DIAGNOSIS — I10 Essential (primary) hypertension: Secondary | ICD-10-CM | POA: Diagnosis not present

## 2017-06-22 DIAGNOSIS — Z23 Encounter for immunization: Secondary | ICD-10-CM | POA: Diagnosis not present

## 2017-06-22 DIAGNOSIS — E291 Testicular hypofunction: Secondary | ICD-10-CM | POA: Diagnosis not present

## 2017-06-22 DIAGNOSIS — D751 Secondary polycythemia: Secondary | ICD-10-CM

## 2017-06-22 DIAGNOSIS — N182 Chronic kidney disease, stage 2 (mild): Secondary | ICD-10-CM

## 2017-06-22 LAB — COMPREHENSIVE METABOLIC PANEL
ALT: 21 U/L (ref 0–53)
AST: 20 U/L (ref 0–37)
Albumin: 4.6 g/dL (ref 3.5–5.2)
Alkaline Phosphatase: 53 U/L (ref 39–117)
BILIRUBIN TOTAL: 1.4 mg/dL — AB (ref 0.2–1.2)
BUN: 15 mg/dL (ref 6–23)
CO2: 33 meq/L — AB (ref 19–32)
CREATININE: 1.1 mg/dL (ref 0.40–1.50)
Calcium: 9.9 mg/dL (ref 8.4–10.5)
Chloride: 99 mEq/L (ref 96–112)
GFR: 69.95 mL/min (ref >60.00)
Glucose, Bld: 101 mg/dL — ABNORMAL HIGH (ref 70–99)
Potassium: 4.3 mEq/L (ref 3.5–5.1)
SODIUM: 139 meq/L (ref 135–145)
Total Protein: 7.2 g/dL (ref 6.0–8.3)

## 2017-06-22 LAB — CBC WITH DIFFERENTIAL/PLATELET
BASOS ABS: 0.1 10*3/uL (ref 0.0–0.1)
Basophils Relative: 0.8 % (ref 0.0–3.0)
EOS ABS: 0.1 10*3/uL (ref 0.0–0.7)
Eosinophils Relative: 2.1 % (ref 0.0–5.0)
HCT: 52.2 % — ABNORMAL HIGH (ref 39.0–52.0)
HEMOGLOBIN: 17.7 g/dL — AB (ref 13.0–17.0)
LYMPHS PCT: 29.5 % (ref 12.0–46.0)
Lymphs Abs: 2 10*3/uL (ref 0.7–4.0)
MCHC: 33.8 g/dL (ref 30.0–36.0)
MCV: 93.6 fl (ref 78.0–100.0)
MONO ABS: 0.7 10*3/uL (ref 0.1–1.0)
Monocytes Relative: 9.7 % (ref 3.0–12.0)
Neutro Abs: 4 10*3/uL (ref 1.4–7.7)
Neutrophils Relative %: 57.9 % (ref 43.0–77.0)
PLATELETS: 202 10*3/uL (ref 150.0–400.0)
RBC: 5.58 Mil/uL (ref 4.22–5.81)
RDW: 14 % (ref 11.5–15.5)
WBC: 6.8 10*3/uL (ref 4.0–10.5)

## 2017-06-22 LAB — HEMOGLOBIN A1C: HEMOGLOBIN A1C: 6.1 % (ref 4.6–6.5)

## 2017-06-22 LAB — TESTOSTERONE: Testosterone: 247.13 ng/dL — ABNORMAL LOW (ref 300.00–890.00)

## 2017-06-22 NOTE — Addendum Note (Signed)
Addended by: Onalee Hua on: 06/22/2017 09:20 AM   Modules accepted: Orders

## 2017-06-22 NOTE — Progress Notes (Signed)
OFFICE VISIT  06/22/2017   CC:  Chief Complaint  Patient presents with  . Follow-up    RCI, pt is not fasting.   HPI:    Patient is a 71 y.o.  male who presents for IFG, HTN, CRI stage II/III (GFR in the 39s).  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    HTN: occ checks bp when he feels like it's up (about every 2 weeks): occ 150/100, occ normal, occ as low as 115/70.  IFG: avoiding colas, but otherwise not eating a good diet and he is not exercising. Testost: most recently applied the testost 2 days ago.  CRI: avoids NSAIDs.  He does not pay close attention to keeping hydrated well all the time.  ROS: denies palpitations or racing HR, non dizziness, no CP or SOB.  Some LBP lately. PND and cough lately.  No redness of face.  No HAs.  Past Medical History:  Diagnosis Date  . BPH (benign prostatic hyperplasia)   . Chronic renal insufficiency, stage II (mild)    CrCl in the 60s  . Coronary artery disease   . GERD (gastroesophageal reflux disease)   . Hx of colonic polyps    03/2017--adenomatous.  Recall 03/2020 ([facility]).  . Hypercholesteremia    Pt refuses statins (as of 05/2016)  . Hypertension   . IBS (irritable bowel syndrome)   . IFG (impaired fasting glucose) 06/22/2015   123.  A1c was 5.9%.  Stable at 6.0% 05/2016.  . OSA on CPAP    Gala Murdoch, NP at Neurology-Sleep Medicine at [facility]  . Peripheral vascular disease ([place])   . Permanent atrial fibrillation ([place]) 2001   failed DCCV per pt  . Polycythemia    Pt states he had w/u for hemachromatosis but it was NEG.  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  . Prostate cancer screening    Pt declines any further prostate ca screening as of 05/2015.  Marland Kitchen Reactive airway disease   . Scoliosis   . Testosterone deficiency     Past Surgical History:  Procedure Laterality Date  . CARDIAC CATHETERIZATION  approx 2001   nonobstruc  CAD  . CIRCUMCISION    . COLONOSCOPY W/ POLYPECTOMY  05/12/11; 04/27/17   2018 adenomatous polyp; repeat 3 yrs (03/2020)  . NASAL POLYP SURGERY    . SKIN CANCER EXCISION     Non-malanoma  . VASECTOMY  1983    Outpatient Medications Prior to Visit  Medication Sig Dispense Refill  . bisoprolol-hydrochlorothiazide (ZIAC) 5-6.25 MG tablet TAKE 1 TABLET BY MOUTH DAILY. 90 tablet 0  . Calcium Carbonate-Vitamin D (CALCIUM 600+D) 600-200 MG-UNIT TABS Take 1 tablet by mouth 2 (two) times daily.    Marland Kitchen diltiazem (CARDIZEM CD) 180 MG 24 hr capsule TAKE 1 CAPSULE BY MOUTH 2 TIMES DAILY. 180 capsule 3  . Glucosamine-Chondroit-Vit C-Mn (GLUCOSAMINE-CHONDROITIN MAX ST PO) Take 2 capsules by mouth daily.     Marland Kitchen losartan (COZAAR) 50 MG tablet TAKE 1 TABLET BY MOUTH DAILY. 90 tablet 3  . Multiple Vitamins-Minerals (MENS MULTIVITAMIN PLUS PO) Take 1 tablet by mouth daily.    . pantoprazole (PROTONIX) 40 MG tablet TAKE 1 TABLET BY MOUTH DAILY. 90 tablet 0  . telmisartan (MICARDIS) 40 MG tablet Take 40 mg by mouth daily.    . Testosterone 12.5 MG/ACT (1%) GEL APPLY 3 PUMPS ONTO SKIN DAILY AS DIRECTED 150 g 5  . warfarin (COUMADIN) 5 MG  tablet      No facility-administered medications prior to visit.     Allergies  Allergen Reactions  . Lac Bovis Other (See Comments)    Causes reflux if consumed in the pm.  . Atorvastatin     REACTION: INTOL to Lipitor  . Pollen Extract Other (See Comments)    Stuffy nose    ROS As per HPI  PE: Blood pressure 138/90, pulse 63, temperature 98.4 F (36.9 C), temperature source Oral, resp. rate 16, height 5\' 5"  (1.651 m), weight 213 lb 8 oz (96.8 kg), SpO2 97 %. Gen: Alert, well appearing.  Patient is oriented to person, place, time, and situation. AFFECT: pleasant, lucid thought and speech. CV: irreg irreg, distant S1 and S2, no audible murmur.  No rub. Chest is clear, no wheezing or rales. Normal symmetric air entry throughout both lung fields. No chest wall  deformities or tenderness.   LABS:  Lab Results  Component Value Date   TSH 0.60 12/01/2013   Lab Results  Component Value Date   WBC 5.2 12/20/2016   HGB 17.0 12/20/2016   HCT 49.7 12/20/2016   MCV 93.1 12/20/2016   PLT 185.0 12/20/2016   Lab Results  Component Value Date   CREATININE 1.21 12/20/2016   BUN 19 12/20/2016   NA 140 12/20/2016   K 4.5 12/20/2016   CL 102 12/20/2016   CO2 31 12/20/2016   Lab Results  Component Value Date   ALT 25 06/21/2016   AST 21 06/21/2016   ALKPHOS 52 06/21/2016   BILITOT 1.0 06/21/2016   Lab Results  Component Value Date   CHOL 222 (H) 06/21/2016   Lab Results  Component Value Date   HDL 40.30 06/21/2016   Lab Results  Component Value Date   LDLCALC 131 (H) 12/01/2013   Lab Results  Component Value Date   TRIG 261.0 (H) 06/21/2016   Lab Results  Component Value Date   CHOLHDL 6 06/21/2016   Lab Results  Component Value Date   PSA 2.15 06/21/2016   PSA 2.53 12/01/2013   PSA 2.30 11/25/2012   Lab Results  Component Value Date   TESTOSTERONE 248.56 (L) 06/21/2016   Lab Results  Component Value Date   HGBA1C 6.0 06/22/2016     IMPRESSION AND PLAN:  1) HTN; The current medical regimen is effective;  continue present plan and medications. Lytes/cr today.  2) IFG: he is not making any attempts at [facility] unfortunately. HbA1c today.  3) CRI with GFR in 60s.  Avoiding NSAIDs.  Encouraged him to focus more on being consistently well hydrated. Lytes/cr today.  4) Male hypogonadism.  The current medical regimen is effective;  continue present plan and medications. Monitor Hb today. Check testosterone today--this will be his annual check (most recent testost application was 2 d/a).  5) Preventative health: Final pneumovax 23 booster today.  Flu vaccine today.  An After Visit Summary was printed and given to the patient.  FOLLOW UP: Return in about 6 months (around 12/21/2017) for routine chronic illness  f/u.  Signed:  Crissie Sickles, MD           06/22/2017

## 2017-06-25 ENCOUNTER — Other Ambulatory Visit: Payer: Self-pay | Admitting: Family Medicine

## 2017-06-25 DIAGNOSIS — D751 Secondary polycythemia: Secondary | ICD-10-CM

## 2017-07-08 DIAGNOSIS — H02835 Dermatochalasis of left lower eyelid: Secondary | ICD-10-CM

## 2017-07-08 DIAGNOSIS — H02834 Dermatochalasis of left upper eyelid: Secondary | ICD-10-CM

## 2017-07-08 DIAGNOSIS — H3589 Other specified retinal disorders: Secondary | ICD-10-CM | POA: Insufficient documentation

## 2017-07-08 DIAGNOSIS — H25813 Combined forms of age-related cataract, bilateral: Secondary | ICD-10-CM | POA: Insufficient documentation

## 2017-07-08 DIAGNOSIS — H52203 Unspecified astigmatism, bilateral: Secondary | ICD-10-CM

## 2017-07-08 DIAGNOSIS — H43393 Other vitreous opacities, bilateral: Secondary | ICD-10-CM | POA: Insufficient documentation

## 2017-07-08 DIAGNOSIS — H5213 Myopia, bilateral: Secondary | ICD-10-CM | POA: Insufficient documentation

## 2017-07-08 DIAGNOSIS — H02831 Dermatochalasis of right upper eyelid: Secondary | ICD-10-CM | POA: Insufficient documentation

## 2017-07-08 DIAGNOSIS — H524 Presbyopia: Secondary | ICD-10-CM

## 2017-07-08 DIAGNOSIS — H11823 Conjunctivochalasis, bilateral: Secondary | ICD-10-CM | POA: Insufficient documentation

## 2017-07-08 DIAGNOSIS — H02832 Dermatochalasis of right lower eyelid: Secondary | ICD-10-CM

## 2017-07-23 ENCOUNTER — Other Ambulatory Visit (INDEPENDENT_AMBULATORY_CARE_PROVIDER_SITE_OTHER): Payer: PRIVATE HEALTH INSURANCE

## 2017-07-23 ENCOUNTER — Encounter: Payer: Self-pay | Admitting: Family Medicine

## 2017-07-23 DIAGNOSIS — D751 Secondary polycythemia: Secondary | ICD-10-CM | POA: Diagnosis not present

## 2017-07-23 LAB — CBC WITH DIFFERENTIAL/PLATELET
Basophils Absolute: 0.1 10*3/uL (ref 0.0–0.1)
Basophils Relative: 1 % (ref 0.0–3.0)
EOS PCT: 2.6 % (ref 0.0–5.0)
Eosinophils Absolute: 0.2 10*3/uL (ref 0.0–0.7)
HCT: 51 % (ref 39.0–52.0)
HEMOGLOBIN: 17.2 g/dL — AB (ref 13.0–17.0)
Lymphocytes Relative: 31 % (ref 12.0–46.0)
Lymphs Abs: 1.8 10*3/uL (ref 0.7–4.0)
MCHC: 33.7 g/dL (ref 30.0–36.0)
MCV: 94.1 fl (ref 78.0–100.0)
MONO ABS: 0.5 10*3/uL (ref 0.1–1.0)
MONOS PCT: 7.8 % (ref 3.0–12.0)
Neutro Abs: 3.4 10*3/uL (ref 1.4–7.7)
Neutrophils Relative %: 57.6 % (ref 43.0–77.0)
Platelets: 186 10*3/uL (ref 150.0–400.0)
RBC: 5.42 Mil/uL (ref 4.22–5.81)
RDW: 13.9 % (ref 11.5–15.5)
WBC: 5.9 10*3/uL (ref 4.0–10.5)

## 2017-09-24 ENCOUNTER — Other Ambulatory Visit: Payer: Self-pay | Admitting: Family Medicine

## 2017-11-24 ENCOUNTER — Other Ambulatory Visit: Payer: Self-pay | Admitting: Family Medicine

## 2017-12-21 ENCOUNTER — Ambulatory Visit (INDEPENDENT_AMBULATORY_CARE_PROVIDER_SITE_OTHER): Payer: PRIVATE HEALTH INSURANCE | Admitting: Family Medicine

## 2017-12-21 ENCOUNTER — Encounter: Payer: Self-pay | Admitting: Family Medicine

## 2017-12-21 ENCOUNTER — Encounter: Payer: Self-pay | Admitting: *Deleted

## 2017-12-21 VITALS — BP 131/80 | HR 67 | Temp 98.3°F | Resp 16 | Ht 65.0 in | Wt 212.2 lb

## 2017-12-21 DIAGNOSIS — E7800 Pure hypercholesterolemia, unspecified: Secondary | ICD-10-CM | POA: Diagnosis not present

## 2017-12-21 DIAGNOSIS — E291 Testicular hypofunction: Secondary | ICD-10-CM | POA: Diagnosis not present

## 2017-12-21 DIAGNOSIS — N183 Chronic kidney disease, stage 3 unspecified: Secondary | ICD-10-CM

## 2017-12-21 DIAGNOSIS — D751 Secondary polycythemia: Secondary | ICD-10-CM

## 2017-12-21 DIAGNOSIS — E669 Obesity, unspecified: Secondary | ICD-10-CM | POA: Diagnosis not present

## 2017-12-21 DIAGNOSIS — I10 Essential (primary) hypertension: Secondary | ICD-10-CM | POA: Diagnosis not present

## 2017-12-21 DIAGNOSIS — R7303 Prediabetes: Secondary | ICD-10-CM | POA: Diagnosis not present

## 2017-12-21 LAB — CBC
HEMATOCRIT: 50.2 % (ref 39.0–52.0)
HEMOGLOBIN: 17 g/dL (ref 13.0–17.0)
MCHC: 33.8 g/dL (ref 30.0–36.0)
MCV: 93.2 fl (ref 78.0–100.0)
Platelets: 178 10*3/uL (ref 150.0–400.0)
RBC: 5.39 Mil/uL (ref 4.22–5.81)
RDW: 14.1 % (ref 11.5–15.5)
WBC: 5.4 10*3/uL (ref 4.0–10.5)

## 2017-12-21 LAB — BASIC METABOLIC PANEL
BUN: 16 mg/dL (ref 6–23)
CHLORIDE: 104 meq/L (ref 96–112)
CO2: 26 mEq/L (ref 19–32)
Calcium: 9.1 mg/dL (ref 8.4–10.5)
Creatinine, Ser: 1.12 mg/dL (ref 0.40–1.50)
GFR: 68.42 mL/min (ref >60.00)
GLUCOSE: 119 mg/dL — AB (ref 70–99)
Potassium: 4.3 mEq/L (ref 3.5–5.1)
SODIUM: 140 meq/L (ref 135–145)

## 2017-12-21 LAB — PSA: PSA: 2.19 ng/mL (ref 0.10–4.00)

## 2017-12-21 LAB — HEMOGLOBIN A1C: Hgb A1c MFr Bld: 6 % (ref 4.6–6.5)

## 2017-12-21 NOTE — Progress Notes (Signed)
OFFICE VISIT  12/21/2017   CC:  Chief Complaint  Patient presents with  . Follow-up    RCI, pt is fasting.     HPI:    Patient is a 72 y.o.  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  Hx of hyperlipidemia but refuses statins. 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   [place] the beach all [place]!  Has permanent a-fib, on coumadin, mgmt by coumadin clinic.  HTN: occ bp monitoring--130/80s average.  Prediabetes/HLD: not exercising.  NO will power. Diet: not healthy.  CRI stage II/III: no aleve or ibup.  Tries to stay well hydrated.  Hypogonadism: 05/2017 Hb 17.6 so I decreased his testost dose to 1 pump qd alt w/ 2 pumps qd.  His f/u Hb 1 mo later was down a little and we kept him on that same dose.  However, he is only taking 2 pumps avg of 3d/week.  ROS: no CP, no SOB, no LE swelling.  No palpitations or heart racing.  Past Medical History:  Diagnosis Date  . BPH (benign prostatic hyperplasia)   . Cataract    Right; impairing vision as of 05/2017. (has ophthalmologist)  . Chronic renal insufficiency, stage II (mild)    CrCl in the 60s  . Coronary artery disease   . GERD (gastroesophageal reflux disease)   . Hx of colonic polyps    03/2017--adenomatous.  Recall 03/2020 ([facility]).  . Hypercholesteremia    Pt refuses statins (as of 05/2016)  . Hypertension   . IBS (irritable bowel syndrome)   . IFG (impaired fasting glucose) 06/22/2015   123.  A1c was 5.9%.  Stable at 6.0% 05/2016.  . OSA on CPAP    Gala Murdoch, NP at Neurology-Sleep Medicine at [facility]  . Peripheral vascular disease ([place])   . Permanent atrial fibrillation ([place]) 2001   failed DCCV per pt.  Rate control + coumadin--monitored by cardiologist.  . Polycythemia    Pt states he had w/u for hemachromatosis but it was NEG.  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  . Prostate  cancer screening    Pt declines any further prostate ca screening as of 05/2015.  Marland Kitchen Reactive airway disease   . Scoliosis   . Testosterone deficiency     Past Surgical History:  Procedure Laterality Date  . CARDIAC CATHETERIZATION  approx 2001   nonobstruc CAD  . CIRCUMCISION    . COLONOSCOPY W/ POLYPECTOMY  05/12/11; 04/27/17   2018 adenomatous polyp; repeat 3 yrs (03/2020)  . NASAL POLYP SURGERY    . SKIN CANCER EXCISION     Non-malanoma  . VASECTOMY  1983    Outpatient Medications Prior to Visit  Medication Sig Dispense Refill  . bisoprolol-hydrochlorothiazide (ZIAC) 5-6.25 MG tablet TAKE 1 TABLET BY MOUTH DAILY. 90 tablet 0  . Calcium Carbonate-Vitamin D (CALCIUM 600+D) 600-200 MG-UNIT TABS Take 1 tablet by mouth 2 (two) times daily.    Marland Kitchen diltiazem (CARDIZEM CD) 180 MG 24 hr capsule TAKE 1 CAPSULE BY MOUTH 2 TIMES DAILY. 180 capsule 3  . Glucosamine-Chondroit-Vit C-Mn (GLUCOSAMINE-CHONDROITIN MAX ST PO) Take 2 capsules by mouth daily.     Marland Kitchen losartan (COZAAR) 50 MG tablet TAKE 1 TABLET BY MOUTH DAILY. 90 tablet 1  . Multiple Vitamins-Minerals (MENS MULTIVITAMIN PLUS PO) Take 1 tablet by mouth daily.    . pantoprazole (PROTONIX) 40 MG  tablet TAKE 1 TABLET BY MOUTH DAILY. 90 tablet 0  . Testosterone 12.5 MG/ACT (1%) GEL APPLY 3 PUMPS ONTO SKIN DAILY AS DIRECTED 150 g 5  . warfarin (COUMADIN) 5 MG tablet     . telmisartan (MICARDIS) 40 MG tablet Take 40 mg by mouth daily.     No facility-administered medications prior to visit.     Allergies  Allergen Reactions  . Lac Bovis Other (See Comments)    Causes reflux if consumed in the pm.  . Atorvastatin     REACTION: INTOL to Lipitor  . Pollen Extract Other (See Comments)    Stuffy nose    ROS As per HPI  PE: Blood pressure 131/80, pulse 67, temperature 98.3 F (36.8 C), temperature source Oral, resp. rate 16, height 5\' 5"  (1.651 m), weight 212 lb 4 oz (96.3 kg), SpO2 95 %. Gen: Alert, well appearing.  Patient is oriented  to person, place, time, and situation. AFFECT: pleasant, lucid thought and speech. CV: irreg irreg, no m/r Chest is clear, no wheezing or rales. Normal symmetric air entry throughout both lung fields. No chest wall deformities or tenderness. EXT: no clubbing, cyanosis, or edema.    LABS:  Lab Results  Component Value Date   TSH 0.60 12/01/2013   Lab Results  Component Value Date   WBC 5.9 07/23/2017   HGB 17.2 (H) 07/23/2017   HCT 51.0 07/23/2017   MCV 94.1 07/23/2017   PLT 186.0 07/23/2017   Lab Results  Component Value Date   CREATININE 1.10 06/22/2017   BUN 15 06/22/2017   NA 139 06/22/2017   K 4.3 06/22/2017   CL 99 06/22/2017   CO2 33 (H) 06/22/2017   Lab Results  Component Value Date   ALT 21 06/22/2017   AST 20 06/22/2017   ALKPHOS 53 06/22/2017   BILITOT 1.4 (H) 06/22/2017   Lab Results  Component Value Date   CHOL 222 (H) 06/21/2016   Lab Results  Component Value Date   HDL 40.30 06/21/2016   Lab Results  Component Value Date   LDLCALC 131 (H) 12/01/2013   Lab Results  Component Value Date   TRIG 261.0 (H) 06/21/2016   Lab Results  Component Value Date   CHOLHDL 6 06/21/2016   Lab Results  Component Value Date   PSA 2.15 06/21/2016   PSA 2.53 12/01/2013   PSA 2.30 11/25/2012   Lab Results  Component Value Date   HGBA1C 6.1 06/22/2017   Lab Results  Component Value Date   TESTOSTERONE 247.13 (L) 06/22/2017    IMPRESSION AND PLAN:  1) HTN: The current medical regimen is effective;  continue present plan and medications. Lytes/cr today.  2) Prediabetes: needs to get started with TLC. Recheck HbA1c today.  3) CRI stage II/III: avoid NSAIDs.  Aggressive hydration, esp in warm months.  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 He is taking testost less now. Recheck CBC and PSA.  5) HLD: no longer monitoring this b/c pt chooses not to: he refuses to consider statin therapy. An After Visit Summary was  printed and given to the patient.  FOLLOW UP: Return in about 6 months (around 06/22/2018) for routine chronic illness f/u.  Signed:  Crissie Sickles, MD           12/21/2017

## 2018-02-19 ENCOUNTER — Other Ambulatory Visit: Payer: Self-pay | Admitting: Family Medicine

## 2018-06-20 ENCOUNTER — Ambulatory Visit: Payer: PRIVATE HEALTH INSURANCE | Admitting: Family Medicine

## 2018-06-24 ENCOUNTER — Ambulatory Visit: Payer: PRIVATE HEALTH INSURANCE | Admitting: Family Medicine

## 2018-06-24 ENCOUNTER — Encounter: Payer: Self-pay | Admitting: Family Medicine

## 2018-06-24 VITALS — BP 136/91 | HR 72 | Temp 98.0°F | Resp 16 | Ht 65.0 in | Wt 206.2 lb

## 2018-06-24 DIAGNOSIS — E291 Testicular hypofunction: Secondary | ICD-10-CM | POA: Diagnosis not present

## 2018-06-24 DIAGNOSIS — E7800 Pure hypercholesterolemia, unspecified: Secondary | ICD-10-CM | POA: Diagnosis not present

## 2018-06-24 DIAGNOSIS — N182 Chronic kidney disease, stage 2 (mild): Secondary | ICD-10-CM | POA: Diagnosis not present

## 2018-06-24 DIAGNOSIS — R7303 Prediabetes: Secondary | ICD-10-CM | POA: Diagnosis not present

## 2018-06-24 DIAGNOSIS — I10 Essential (primary) hypertension: Secondary | ICD-10-CM

## 2018-06-24 DIAGNOSIS — E669 Obesity, unspecified: Secondary | ICD-10-CM

## 2018-06-24 LAB — LIPID PANEL
CHOLESTEROL: 170 mg/dL (ref 0–200)
HDL: 37 mg/dL — ABNORMAL LOW (ref >39.00)
LDL CALC: 108 mg/dL — AB (ref 0–99)
NonHDL: 132.97
TRIGLYCERIDES: 127 mg/dL (ref 0.0–149.0)
Total CHOL/HDL Ratio: 5
VLDL: 25.4 mg/dL (ref 0.0–40.0)

## 2018-06-24 LAB — COMPREHENSIVE METABOLIC PANEL
ALBUMIN: 4.6 g/dL (ref 3.5–5.2)
ALT: 22 U/L (ref 0–53)
AST: 20 U/L (ref 0–37)
Alkaline Phosphatase: 52 U/L (ref 39–117)
BUN: 19 mg/dL (ref 6–23)
CALCIUM: 9.5 mg/dL (ref 8.4–10.5)
CHLORIDE: 104 meq/L (ref 96–112)
CO2: 30 meq/L (ref 19–32)
Creatinine, Ser: 1.13 mg/dL (ref 0.40–1.50)
GFR: 67.63 mL/min (ref >60.00)
Glucose, Bld: 124 mg/dL — ABNORMAL HIGH (ref 70–99)
POTASSIUM: 4.4 meq/L (ref 3.5–5.1)
SODIUM: 142 meq/L (ref 135–145)
Total Bilirubin: 1 mg/dL (ref 0.2–1.2)
Total Protein: 7.1 g/dL (ref 6.0–8.3)

## 2018-06-24 LAB — CBC WITH DIFFERENTIAL/PLATELET
BASOS PCT: 1.5 % (ref 0.0–3.0)
Basophils Absolute: 0.1 10*3/uL (ref 0.0–0.1)
EOS ABS: 0.1 10*3/uL (ref 0.0–0.7)
Eosinophils Relative: 2.1 % (ref 0.0–5.0)
HEMATOCRIT: 51.5 % (ref 39.0–52.0)
HEMOGLOBIN: 17.5 g/dL — AB (ref 13.0–17.0)
LYMPHS PCT: 32.5 % (ref 12.0–46.0)
Lymphs Abs: 1.7 10*3/uL (ref 0.7–4.0)
MCHC: 34.1 g/dL (ref 30.0–36.0)
MCV: 92.8 fl (ref 78.0–100.0)
Monocytes Absolute: 0.4 10*3/uL (ref 0.1–1.0)
Monocytes Relative: 8.1 % (ref 3.0–12.0)
NEUTROS ABS: 3 10*3/uL (ref 1.4–7.7)
Neutrophils Relative %: 55.8 % (ref 43.0–77.0)
PLATELETS: 171 10*3/uL (ref 150.0–400.0)
RBC: 5.55 Mil/uL (ref 4.22–5.81)
RDW: 14 % (ref 11.5–15.5)
WBC: 5.3 10*3/uL (ref 4.0–10.5)

## 2018-06-24 LAB — TESTOSTERONE: TESTOSTERONE: 327.78 ng/dL (ref 300.00–890.00)

## 2018-06-24 LAB — HEMOGLOBIN A1C: Hgb A1c MFr Bld: 6 % (ref 4.6–6.5)

## 2018-06-24 MED ORDER — TESTOSTERONE 12.5 MG/ACT (1%) TD GEL: TRANSDERMAL | 5 refills | Status: DC

## 2018-06-24 MED ORDER — BISOPROLOL-HYDROCHLOROTHIAZIDE 5-6.25 MG PO TABS: 1.0000 | ORAL_TABLET | Freq: Every day | ORAL | 1 refills | Status: DC

## 2018-06-24 NOTE — Progress Notes (Signed)
OFFICE VISIT  06/24/2018   CC:  Chief Complaint  Patient presents with  . Follow-up    RCI, pt is fasting.   HPI:    Patient is a 72 y.o. Caucasian male who presents for 6 mo f/u HTN, CRI with GFR in the 40s, HLD and male hypogonadism. He has permanent a-fib, managed with rate control and anticoag by cardiologist. No acute complaints.  HTN: consistently <130/80.  Prediab/HLD: he has refused statins for cholesterol tx. No signif exercise, not working on anything in his diet.  Works in yard.  Hypogonad: last applied yesterday morning.  CRI: avoids NSAIDs.  Hydrates well per report today.  ROS: no CP, no SOB, no wheezing, no cough, no dizziness, no HAs, no rashes, no melena/hematochezia.  No polyuria or polydipsia.  No myalgias or arthralgias.    Past Medical History:  Diagnosis Date  . BPH (benign prostatic hyperplasia)   . Cataract    Right; impairing vision as of 05/2017. (has ophthalmologist)  . Chronic renal insufficiency, stage II (mild)    CrCl in the 60s  . Coronary artery disease   . GERD (gastroesophageal reflux disease)   . Hx of colonic polyps    03/2017--adenomatous.  Recall 03/2020 ([facility]).  . Hypercholesteremia    Pt refuses statins (as of 05/2016)  . Hypertension   . IBS (irritable bowel syndrome)   . OSA on CPAP    Gala Murdoch, NP at Neurology-Sleep Medicine at [facility]  . Peripheral vascular disease ([place])   . Permanent atrial fibrillation 2001   failed DCCV per pt.  Rate control + coumadin--monitored by cardiologist.  . Polycythemia    Pt states he had w/u for hemachromatosis but it was NEG.  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  . Prediabetes 06/22/2015   123.  A1c was 5.9%.  Stable at 6.0% 05/2016.  A1c 6.0% 11/2017.  Marland Kitchen Prostate cancer screening    Pt declines any further prostate ca screening as of 05/2015.  Marland Kitchen Reactive airway disease   . Scoliosis   . Testosterone deficiency     Past  Surgical History:  Procedure Laterality Date  . CARDIAC CATHETERIZATION  approx 2001   nonobstruc CAD  . CIRCUMCISION    . COLONOSCOPY W/ POLYPECTOMY  05/12/11; 04/27/17   2018 adenomatous polyp; repeat 3 yrs (03/2020)  . NASAL POLYP SURGERY    . SKIN CANCER EXCISION     Non-malanoma  . VASECTOMY  1983    Outpatient Medications Prior to Visit  Medication Sig Dispense Refill  . Calcium Carbonate-Vitamin D (CALCIUM 600+D) 600-200 MG-UNIT TABS Take 1 tablet by mouth 2 (two) times daily.    Marland Kitchen diltiazem (CARDIZEM CD) 180 MG 24 hr capsule TAKE 1 CAPSULE BY MOUTH 2 TIMES DAILY. 180 capsule 3  . Glucosamine-Chondroit-Vit C-Mn (GLUCOSAMINE-CHONDROITIN MAX ST PO) Take 2 capsules by mouth daily.     Marland Kitchen losartan (COZAAR) 50 MG tablet TAKE 1 TABLET BY MOUTH DAILY. 90 tablet 1  . Multiple Vitamins-Minerals (MENS MULTIVITAMIN PLUS PO) Take 1 tablet by mouth daily.    . pantoprazole (PROTONIX) 40 MG tablet TAKE 1 TABLET BY MOUTH DAILY. 90 tablet 1  . warfarin (COUMADIN) 5 MG tablet     . bisoprolol-hydrochlorothiazide (ZIAC) 5-6.25 MG tablet TAKE 1 TABLET BY MOUTH DAILY. 90 tablet 1  . Testosterone 12.5 MG/ACT (1%) GEL APPLY 3 PUMPS ONTO SKIN DAILY AS DIRECTED 150 g 5   No facility-administered medications prior to visit.  Allergies  Allergen Reactions  . Lac Bovis Other (See Comments)    Causes reflux if consumed in the pm.  . Atorvastatin     REACTION: INTOL to Lipitor  . Pollen Extract Other (See Comments)    Stuffy nose    ROS As per HPI  PE: Blood pressure (!) 136/91, pulse 72, temperature 98 F (36.7 C), temperature source Oral, resp. rate 16, height 5\' 5"  (1.651 m), weight 206 lb 4 oz (93.6 kg), SpO2 98 %. Gen: Alert, well appearing.  Patient is oriented to person, place, time, and situation. IRW:ERXV: no injection, icteris, swelling, or exudate.  EOMI, PERRLA. Mouth: lips without lesion/swelling.  Oral mucosa pink and moist. Oropharynx without erythema, exudate, or swelling.   CV: irreg irreg, no m/r. Chest is clear, no wheezing or rales. Normal symmetric air entry throughout both lung fields. No chest wall deformities or tenderness. EXT: no clubbing or cyanosis.  no edema.    LABS:  Lab Results  Component Value Date   TSH 0.60 12/01/2013   Lab Results  Component Value Date   WBC 5.4 12/21/2017   HGB 17.0 12/21/2017   HCT 50.2 12/21/2017   MCV 93.2 12/21/2017   PLT 178.0 12/21/2017   Lab Results  Component Value Date   CREATININE 1.12 12/21/2017   BUN 16 12/21/2017   NA 140 12/21/2017   K 4.3 12/21/2017   CL 104 12/21/2017   CO2 26 12/21/2017   Lab Results  Component Value Date   ALT 21 06/22/2017   AST 20 06/22/2017   ALKPHOS 53 06/22/2017   BILITOT 1.4 (H) 06/22/2017   Lab Results  Component Value Date   CHOL 222 (H) 06/21/2016   Lab Results  Component Value Date   HDL 40.30 06/21/2016   Lab Results  Component Value Date   LDLCALC 131 (H) 12/01/2013   Lab Results  Component Value Date   TRIG 261.0 (H) 06/21/2016   Lab Results  Component Value Date   CHOLHDL 6 06/21/2016   Lab Results  Component Value Date   PSA 2.19 12/21/2017   PSA 2.15 06/21/2016   PSA 2.53 12/01/2013   Lab Results  Component Value Date   TESTOSTERONE 247.13 (L) 06/22/2017   Lab Results  Component Value Date   HGBA1C 6.0 12/21/2017    IMPRESSION AND PLAN:  1) HTN: The current medical regimen is effective;  continue present plan and medications. Lytes/cr today.  2) HLD: has declined statin tx in the past.  FLP today. Not doing well with TLC.  3) Prediabetes: not making any big changes regarding TLC. HbA1c today.  4) CRI with GFR in 60s: avoids NSAIDs.  Hydrating well. Lytes/cr today.  5) Hypogonadism, male. Testosterone level today. Monitor Hb today.  An After Visit Summary was printed and given to the patient.  FOLLOW UP: Return in about 6 months (around 12/24/2018) for annual CPE (fasting).  Signed:  Crissie Sickles, MD            06/24/2018

## 2018-07-01 ENCOUNTER — Other Ambulatory Visit: Payer: Self-pay | Admitting: Family Medicine

## 2018-11-12 ENCOUNTER — Other Ambulatory Visit: Payer: Self-pay | Admitting: Family Medicine

## 2018-12-24 ENCOUNTER — Encounter: Payer: PRIVATE HEALTH INSURANCE | Admitting: Family Medicine

## 2018-12-26 ENCOUNTER — Other Ambulatory Visit: Payer: Self-pay | Admitting: Family Medicine

## 2019-01-08 DIAGNOSIS — Z7901 Long term (current) use of anticoagulants: Secondary | ICD-10-CM | POA: Insufficient documentation

## 2019-02-20 ENCOUNTER — Telehealth: Payer: Self-pay | Admitting: Family Medicine

## 2019-02-20 NOTE — Telephone Encounter (Signed)
Patient requesting Rx refill bisoprolol  -hydrochlorothiazide 90 day supply sent to [facility]

## 2019-02-20 NOTE — Telephone Encounter (Signed)
SW pharmacist, Engineer, technical sales at [facility]. Pt still has 1 refill left for 90 d supply of requested medication. Pt has been notified of this information.

## 2019-03-19 ENCOUNTER — Encounter: Payer: Self-pay | Admitting: Family Medicine

## 2019-03-19 ENCOUNTER — Ambulatory Visit (INDEPENDENT_AMBULATORY_CARE_PROVIDER_SITE_OTHER): Payer: PRIVATE HEALTH INSURANCE | Admitting: Family Medicine

## 2019-03-19 ENCOUNTER — Other Ambulatory Visit: Payer: Self-pay

## 2019-03-19 VITALS — BP 122/84 | HR 62 | Temp 98.1°F | Resp 16 | Ht 65.0 in | Wt 210.0 lb

## 2019-03-19 DIAGNOSIS — R7989 Other specified abnormal findings of blood chemistry: Secondary | ICD-10-CM

## 2019-03-19 DIAGNOSIS — R7303 Prediabetes: Secondary | ICD-10-CM

## 2019-03-19 DIAGNOSIS — Z0000 Encounter for general adult medical examination without abnormal findings: Secondary | ICD-10-CM

## 2019-03-19 DIAGNOSIS — I482 Chronic atrial fibrillation, unspecified: Secondary | ICD-10-CM

## 2019-03-19 DIAGNOSIS — I10 Essential (primary) hypertension: Secondary | ICD-10-CM

## 2019-03-19 DIAGNOSIS — E7800 Pure hypercholesterolemia, unspecified: Secondary | ICD-10-CM | POA: Diagnosis not present

## 2019-03-19 LAB — LIPID PANEL
Cholesterol: 216 mg/dL — ABNORMAL HIGH (ref 0–200)
HDL: 38.5 mg/dL — ABNORMAL LOW (ref >39.00)
NonHDL: 177.8
Total CHOL/HDL Ratio: 6
Triglycerides: 266 mg/dL — ABNORMAL HIGH (ref 0.0–149.0)
VLDL: 53.2 mg/dL — ABNORMAL HIGH (ref 0.0–40.0)

## 2019-03-19 LAB — COMPREHENSIVE METABOLIC PANEL
ALT: 22 U/L (ref 0–53)
AST: 20 U/L (ref 0–37)
Albumin: 4.7 g/dL (ref 3.5–5.2)
Alkaline Phosphatase: 54 U/L (ref 39–117)
BUN: 18 mg/dL (ref 6–23)
CO2: 28 mEq/L (ref 19–32)
Calcium: 9.7 mg/dL (ref 8.4–10.5)
Chloride: 102 mEq/L (ref 96–112)
Creatinine, Ser: 1.1 mg/dL (ref 0.40–1.50)
GFR: 65.5 mL/min (ref >60.00)
Glucose, Bld: 113 mg/dL — ABNORMAL HIGH (ref 70–99)
Potassium: 4.6 mEq/L (ref 3.5–5.1)
Sodium: 140 mEq/L (ref 135–145)
Total Bilirubin: 1.2 mg/dL (ref 0.2–1.2)
Total Protein: 7.1 g/dL (ref 6.0–8.3)

## 2019-03-19 LAB — CBC WITH DIFFERENTIAL/PLATELET
Basophils Absolute: 0.1 10*3/uL (ref 0.0–0.1)
Basophils Relative: 1.2 % (ref 0.0–3.0)
Eosinophils Absolute: 0.2 10*3/uL (ref 0.0–0.7)
Eosinophils Relative: 3.3 % (ref 0.0–5.0)
HCT: 51.1 % (ref 39.0–52.0)
Hemoglobin: 17.4 g/dL — ABNORMAL HIGH (ref 13.0–17.0)
Lymphocytes Relative: 35.5 % (ref 12.0–46.0)
Lymphs Abs: 2.1 10*3/uL (ref 0.7–4.0)
MCHC: 34 g/dL (ref 30.0–36.0)
MCV: 93.6 fl (ref 78.0–100.0)
Monocytes Absolute: 0.5 10*3/uL (ref 0.1–1.0)
Monocytes Relative: 8.6 % (ref 3.0–12.0)
Neutro Abs: 3 10*3/uL (ref 1.4–7.7)
Neutrophils Relative %: 51.4 % (ref 43.0–77.0)
Platelets: 175 10*3/uL (ref 150.0–400.0)
RBC: 5.47 Mil/uL (ref 4.22–5.81)
RDW: 14.1 % (ref 11.5–15.5)
WBC: 5.8 10*3/uL (ref 4.0–10.5)

## 2019-03-19 LAB — HEMOGLOBIN A1C: Hgb A1c MFr Bld: 6.1 % (ref 4.6–6.5)

## 2019-03-19 LAB — LDL CHOLESTEROL, DIRECT: Direct LDL: 128 mg/dL

## 2019-03-19 NOTE — Patient Instructions (Signed)

## 2019-03-19 NOTE — Progress Notes (Signed)
Office Note 03/19/2019  CC:  Chief Complaint  Patient presents with  . Annual Exam    pt is fasting    HPI:  Jeffrey Little is a 73 y.o. male who is here for annual health maintenance exam.  Feeling fine. He is active.  Diet is fair. No acute complaints.  Past Medical History:  Diagnosis Date  . BPH (benign prostatic hyperplasia)   . Cataract    Right; impairing vision as of 05/2017. (has ophthalmologist)  . Chronic renal insufficiency, stage II (mild)    CrCl in the 60s  . Coronary artery disease   . GERD (gastroesophageal reflux disease)   . Hearing loss    In a study at [facility] as of 2019  . Hx of colonic polyps    03/2017--adenomatous.  Recall 03/2020 ([facility]).  . Hypercholesteremia    Pt refuses statins (as of 05/2016)  . Hypertension   . IBS (irritable bowel syndrome)   . OSA on CPAP    Gala Murdoch, NP at Neurology-Sleep Medicine at [facility]  . Peripheral vascular disease ([place])   . Permanent atrial fibrillation 2001   failed DCCV per pt.  Rate control + coumadin--monitored by cardiologist.  . Polycythemia    Pt states he had w/u for hemachromatosis but it was NEG.  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  . Prediabetes 06/22/2015   123.  A1c was 5.9%.  Stable at 6.0% 05/2016.  A1c 6.0% 11/2017.  Marland Kitchen Prostate cancer screening    Pt declines any further prostate ca screening as of 05/2015.  Marland Kitchen Reactive airway disease   . Scoliosis   . Testosterone deficiency     Past Surgical History:  Procedure Laterality Date  . CARDIAC CATHETERIZATION  approx 2001   nonobstruc CAD  . CIRCUMCISION    . COLONOSCOPY W/ POLYPECTOMY  05/12/11; 04/27/17   2018 adenomatous polyp; repeat 3 yrs (03/2020)  . NASAL POLYP SURGERY    . SKIN CANCER EXCISION     Non-malanoma  . VASECTOMY  1983    No family history on file.  Social History   Socioeconomic History  . Marital status: Married    Spouse name: Not on file  . Number of  children: 2  . Years of education: Not on file  . Highest education level: Not on file  Occupational History  . Occupation: retired  Scientific laboratory technician  . Financial resource strain: Not on file  . Food insecurity    Worry: Not on file    Inability: Not on file  . Transportation needs    Medical: Not on file    Non-medical: Not on file  Tobacco Use  . Smoking status: Former Smoker    Years: 34.00    Types: Cigars    Quit date: 08/28/1974    Years since quitting: 44.5  . Smokeless tobacco: Never Used  Substance and Sexual Activity  . Alcohol use: No  . Drug use: No  . Sexual activity: Not on file  Lifestyle  . Physical activity    Days per week: Not on file    Minutes per session: Not on file  . Stress: Not on file  Relationships  . Social Herbalist on phone: Not on file    Gets together: Not on file    Attends religious service: Not on file    Active member of club or organization: Not on file    Attends meetings of  clubs or organizations: Not on file    Relationship status: Not on file  . Intimate partner violence    Fear of current or ex partner: Not on file    Emotionally abused: Not on file    Physically abused: Not on file    Forced sexual activity: Not on file  Other Topics Concern  . Not on file  Social History Narrative   Married, 2 sons, 4 grandchildren.   Orig from [place].   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   No cigs, no alcohol, no drugs.   No exercise.    Outpatient Medications Prior to Visit  Medication Sig Dispense Refill  . bisoprolol-hydrochlorothiazide (ZIAC) 5-6.25 MG tablet TAKE 1 TABLET BY MOUTH DAILY. 90 tablet 1  . Calcium Carbonate-Vitamin D (CALCIUM 600+D) 600-200 MG-UNIT TABS Take 1 tablet by mouth 2 (two) times daily.    Marland Kitchen diltiazem (CARDIZEM CD) 180 MG 24 hr capsule TAKE 1 CAPSULE BY MOUTH 2 TIMES DAILY. 180 capsule 3  . Glucosamine-Chondroit-Vit C-Mn (GLUCOSAMINE-CHONDROITIN  MAX ST PO) Take 2 capsules by mouth daily.     Marland Kitchen losartan (COZAAR) 50 MG tablet TAKE 1 TABLET BY MOUTH DAILY. 90 tablet 0  . Multiple Vitamins-Minerals (MENS MULTIVITAMIN PLUS PO) Take 1 tablet by mouth daily.    . pantoprazole (PROTONIX) 40 MG tablet TAKE 1 TABLET BY MOUTH DAILY. 90 tablet 0  . Testosterone 12.5 MG/ACT (1%) GEL APPLY 3 PUMPS ONTO SKIN DAILY AS DIRECTED 150 g 5  . warfarin (COUMADIN) 5 MG tablet      No facility-administered medications prior to visit.     Allergies  Allergen Reactions  . Lac Bovis Other (See Comments)    Causes reflux if consumed in the pm.  . Atorvastatin     REACTION: INTOL to Lipitor  . Pollen Extract Other (See Comments)    Stuffy nose    ROS Review of Systems  Constitutional: Negative for appetite change, chills, fatigue and fever.  HENT: Negative for congestion, dental problem, ear pain and sore throat.   Eyes: Negative for discharge, redness and visual disturbance.  Respiratory: Negative for cough, chest tightness, shortness of breath and wheezing.   Cardiovascular: Negative for chest pain, palpitations and leg swelling.  Gastrointestinal: Negative for abdominal pain, blood in stool, diarrhea, nausea and vomiting.  Genitourinary: Negative for difficulty urinating, dysuria, flank pain, frequency, hematuria and urgency.  Musculoskeletal: Negative for arthralgias, back pain, joint swelling, myalgias and neck stiffness.  Skin: Negative for pallor and rash.  Neurological: Negative for dizziness, speech difficulty, weakness and headaches.  Hematological: Negative for adenopathy. Does not bruise/bleed easily.  Psychiatric/Behavioral: Negative for confusion and sleep disturbance. The patient is not nervous/anxious.     PE; Blood pressure 122/84, pulse 62, temperature 98.1 F (36.7 C), temperature source Temporal, resp. rate 16, height 5\' 5"  (1.651 m), weight 210 lb (95.3 kg), SpO2 96 %.  Body mass index is 34.95 kg/m.  Gen: Alert, well  appearing.  Patient is oriented to person, place, time, and situation. AFFECT: pleasant, lucid thought and speech. ENT: Ears: EACs clear, normal epithelium.  TMs with good light reflex and landmarks bilaterally.  Eyes: no injection, icteris, swelling, or exudate.  EOMI, PERRLA. Nose: no drainage or turbinate edema/swelling.  No injection or focal lesion.  Mouth: lips without lesion/swelling.  Oral mucosa pink and moist.  Dentition intact and without obvious caries or gingival swelling.  Oropharynx without erythema, exudate, or swelling.  Neck: supple/nontender.  No LAD, mass, or TM.  Carotid pulses 2+ bilaterally, without bruits. CV: RRR, no m/r/g.   LUNGS: CTA bilat, nonlabored resps, good aeration in all lung fields. ABD: soft, NT, ND, BS normal.  No hepatospenomegaly or mass.  No bruits. EXT: no clubbing, cyanosis, or edema.  Musculoskeletal: no joint swelling, erythema, warmth, or tenderness.  ROM of all joints intact. Skin - no sores or suspicious lesions or rashes or color changes   Pertinent labs:  Lab Results  Component Value Date   TSH 0.60 12/01/2013   Lab Results  Component Value Date   WBC 5.3 06/24/2018   HGB 17.5 (H) 06/24/2018   HCT 51.5 06/24/2018   MCV 92.8 06/24/2018   PLT 171.0 06/24/2018   Lab Results  Component Value Date   CREATININE 1.13 06/24/2018   BUN 19 06/24/2018   NA 142 06/24/2018   K 4.4 06/24/2018   CL 104 06/24/2018   CO2 30 06/24/2018   Lab Results  Component Value Date   ALT 22 06/24/2018   AST 20 06/24/2018   ALKPHOS 52 06/24/2018   BILITOT 1.0 06/24/2018   Lab Results  Component Value Date   CHOL 170 06/24/2018   Lab Results  Component Value Date   HDL 37.00 (L) 06/24/2018   Lab Results  Component Value Date   LDLCALC 108 (H) 06/24/2018   Lab Results  Component Value Date   TRIG 127.0 06/24/2018   Lab Results  Component Value Date   CHOLHDL 5 06/24/2018   Lab Results  Component Value Date   PSA 2.19 12/21/2017   PSA  2.15 06/21/2016   PSA 2.53 12/01/2013   Lab Results  Component Value Date   HGBA1C 6.0 06/24/2018   Lab Results  Component Value Date   TESTOSTERONE 327.78 06/24/2018   ASSESSMENT AND PLAN:   Health maintenance exam: Reviewed age and gender appropriate health maintenance issues (prudent diet, regular exercise, health risks of tobacco and excessive alcohol, use of seatbelts, fire alarms in home, use of sunscreen).  Also reviewed age and gender appropriate health screening as well as vaccine recommendations. Vaccines: UTD.  Shingrix discussed-->pt declines this. Labs: fasting HP + A1c (prediabetes). Prostate ca screening: he declines any further screening at this time. Colon ca screening: next colonoscopy due 03/2020.  An After Visit Summary was printed and given to the patient.  FOLLOW UP:  No follow-ups on file.  Signed:  Crissie Sickles, MD           03/19/2019

## 2019-04-07 ENCOUNTER — Other Ambulatory Visit: Payer: Self-pay | Admitting: Family Medicine

## 2019-04-07 NOTE — Telephone Encounter (Signed)
RF request for testosterone 12.5 gel Last OV 03/19/2019 Next OV 08/2019 Last RX 06/24/18 X 5 rfs.  Please advise?

## 2019-05-24 ENCOUNTER — Other Ambulatory Visit: Payer: Self-pay | Admitting: Family Medicine

## 2019-09-19 ENCOUNTER — Encounter: Payer: Self-pay | Admitting: Family Medicine

## 2019-09-19 ENCOUNTER — Ambulatory Visit: Payer: PRIVATE HEALTH INSURANCE | Admitting: Family Medicine

## 2019-09-19 ENCOUNTER — Other Ambulatory Visit: Payer: Self-pay

## 2019-09-19 VITALS — BP 132/85 | HR 63 | Temp 98.0°F | Resp 16 | Ht 65.0 in | Wt 208.6 lb

## 2019-09-19 DIAGNOSIS — R7303 Prediabetes: Secondary | ICD-10-CM | POA: Diagnosis not present

## 2019-09-19 DIAGNOSIS — I10 Essential (primary) hypertension: Secondary | ICD-10-CM

## 2019-09-19 DIAGNOSIS — E7800 Pure hypercholesterolemia, unspecified: Secondary | ICD-10-CM | POA: Diagnosis not present

## 2019-09-19 DIAGNOSIS — E291 Testicular hypofunction: Secondary | ICD-10-CM

## 2019-09-19 DIAGNOSIS — N183 Chronic kidney disease, stage 3 unspecified: Secondary | ICD-10-CM

## 2019-09-19 LAB — COMPREHENSIVE METABOLIC PANEL
ALT: 19 U/L (ref 0–53)
AST: 17 U/L (ref 0–37)
Albumin: 4.4 g/dL (ref 3.5–5.2)
Alkaline Phosphatase: 56 U/L (ref 39–117)
BUN: 19 mg/dL (ref 6–23)
CO2: 29 mEq/L (ref 19–32)
Calcium: 9.4 mg/dL (ref 8.4–10.5)
Chloride: 101 mEq/L (ref 96–112)
Creatinine, Ser: 1.07 mg/dL (ref 0.40–1.50)
GFR: 67.53 mL/min (ref >60.00)
Glucose, Bld: 118 mg/dL — ABNORMAL HIGH (ref 70–99)
Potassium: 4.4 mEq/L (ref 3.5–5.1)
Sodium: 139 mEq/L (ref 135–145)
Total Bilirubin: 0.9 mg/dL (ref 0.2–1.2)
Total Protein: 6.9 g/dL (ref 6.0–8.3)

## 2019-09-19 LAB — CBC WITH DIFFERENTIAL/PLATELET
Basophils Absolute: 0 10*3/uL (ref 0.0–0.1)
Basophils Relative: 0.8 % (ref 0.0–3.0)
Eosinophils Absolute: 0.2 10*3/uL (ref 0.0–0.7)
Eosinophils Relative: 2.9 % (ref 0.0–5.0)
HCT: 50 % (ref 39.0–52.0)
Hemoglobin: 16.8 g/dL (ref 13.0–17.0)
Lymphocytes Relative: 31.4 % (ref 12.0–46.0)
Lymphs Abs: 1.8 10*3/uL (ref 0.7–4.0)
MCHC: 33.6 g/dL (ref 30.0–36.0)
MCV: 92.5 fl (ref 78.0–100.0)
Monocytes Absolute: 0.5 10*3/uL (ref 0.1–1.0)
Monocytes Relative: 9.2 % (ref 3.0–12.0)
Neutro Abs: 3.2 10*3/uL (ref 1.4–7.7)
Neutrophils Relative %: 55.7 % (ref 43.0–77.0)
Platelets: 183 10*3/uL (ref 150.0–400.0)
RBC: 5.4 Mil/uL (ref 4.22–5.81)
RDW: 13.4 % (ref 11.5–15.5)
WBC: 5.7 10*3/uL (ref 4.0–10.5)

## 2019-09-19 LAB — LDL CHOLESTEROL, DIRECT: Direct LDL: 129 mg/dL

## 2019-09-19 LAB — LIPID PANEL
Cholesterol: 208 mg/dL — ABNORMAL HIGH (ref 0–200)
HDL: 38.3 mg/dL — ABNORMAL LOW (ref >39.00)
NonHDL: 169.89
Total CHOL/HDL Ratio: 5
Triglycerides: 217 mg/dL — ABNORMAL HIGH (ref 0.0–149.0)
VLDL: 43.4 mg/dL — ABNORMAL HIGH (ref 0.0–40.0)

## 2019-09-19 LAB — HEMOGLOBIN A1C: Hgb A1c MFr Bld: 5.9 % (ref 4.6–6.5)

## 2019-09-19 LAB — TESTOSTERONE: Testosterone: 196.27 ng/dL — ABNORMAL LOW (ref 300.00–890.00)

## 2019-09-19 NOTE — Progress Notes (Signed)
OFFICE NOTE  09/19/2019  CC:  Chief Complaint  Patient presents with  . Follow-up    RCI, pt is fasting   HPI:   Patient is a 74 y.o.  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 He has permanent a-fib, managed with rate control and anticoag by cardiologist.  Feeling well.  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  BP: home checks 130/80 avg at home. Still feeling some palps/knows he is in a-fib.  Occ inhibits full activity and he rests and feels back to baseline. CRI: avoids NSAIDs.  He admits he doesn't drink fluids adequately.   Chol: no med b/c pt declined, admits diet and exercise not ideal. Testost: most recent application of gel was 4 d/a, uses this 3 days per week typically and it helps.  ROS: no CP, no SOB, no wheezing, no cough, no dizziness, no HAs, no rashes, no melena/hematochezia.  No polyuria or polydipsia. Chronic hand/fingers arthralgias.  Pertinent PMH:  Past Medical History:  Diagnosis Date  . BPH (benign prostatic hyperplasia)   . Cataract    Right; impairing vision as of 05/2017. (has ophthalmologist)  . Chronic renal insufficiency, stage II (mild)    CrCl in the 60s  . Coronary artery disease   . GERD (gastroesophageal reflux disease)   . Hearing loss    In a study at [facility] as of 2019  . Hx of colonic polyps    03/2017--adenomatous.  Recall 03/2020 ([facility]).  . Hypercholesteremia    Pt refuses statins (as of 05/2016). Declined again 02/2019.  Marland Kitchen Hypertension   . IBS (irritable bowel syndrome)   . OSA on CPAP    Gala Murdoch, NP at Neurology-Sleep Medicine at [facility]  . Peripheral vascular disease ([place])   . Permanent atrial fibrillation ([place]) 2001   failed DCCV per pt.  Rate control + coumadin--monitored by cardiologist.  . Polycythemia    Pt states he had w/u for hemachromatosis but it was NEG.  As of 05/2016, his polycythemia is presumably due to testosterone  effect.  . Prediabetes 06/22/2015   123.  A1c was 5.9%.  Stable at 6.0% 05/2016.  A1c 6.0% 11/2017.  Marland Kitchen Prostate cancer screening    Pt declines any further prostate ca screening as of 05/2015.  Marland Kitchen Reactive airway disease   . Scoliosis   . Testosterone deficiency    Past Surgical History:  Procedure Laterality Date  . CARDIAC CATHETERIZATION  approx 2001   nonobstruc CAD  . CIRCUMCISION    . COLONOSCOPY W/ POLYPECTOMY  05/12/11; 04/27/17   2018 adenomatous polyp; repeat 3 yrs (03/2020)  . NASAL POLYP SURGERY    . SKIN CANCER EXCISION     Non-malanoma  . VASECTOMY  1983   Social History   Socioeconomic History  . Marital status: Married    Spouse name: Not on file  . Number of children: 2  . Years of education: Not on file  . Highest education level: Not on file  Occupational History  . Occupation: retired  Tobacco Use  . Smoking status: Former Smoker    Years: 34.00    Types: Cigars    Quit date: 08/28/1974    Years since quitting: 45.0  . Smokeless tobacco: Never Used  Substance and Sexual Activity  . Alcohol use: No  . Drug use: No  . Sexual activity: Not on file  Other Topics Concern  . Not on file  Social History Narrative   Married, 2 sons, 4 grandchildren.   Orig from [place].   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   No cigs, no alcohol, no drugs.   No exercise.   Social Determinants of Health   Financial Resource Strain:   . Difficulty of Paying Living Expenses: Not on file  Food Insecurity:   . Worried About Charity fundraiser in the Last Year: Not on file  . Ran Out of Food in the Last Year: Not on file  Transportation Needs:   . Lack of Transportation (Medical): Not on file  . Lack of Transportation (Non-Medical): Not on file  Physical Activity:   . Days of Exercise per Week: Not on file  . Minutes of Exercise per Session: Not on file  Stress:   . Feeling of Stress : Not on file  Social Connections:    . Frequency of Communication with Friends and Family: Not on file  . Frequency of Social Gatherings with Friends and Family: Not on file  . Attends Religious Services: Not on file  . Active Member of Clubs or Organizations: Not on file  . Attends Archivist Meetings: Not on file  . Marital Status: Not on file    MEDS;   Outpatient Medications Prior to Visit  Medication Sig Dispense Refill  . bisoprolol-hydrochlorothiazide (ZIAC) 5-6.25 MG tablet TAKE 1 TABLET BY MOUTH DAILY. 90 tablet 1  . Calcium Carbonate-Vitamin D (CALCIUM 600+D) 600-200 MG-UNIT TABS Take 1 tablet by mouth 2 (two) times daily.    Marland Kitchen diltiazem (CARDIZEM CD) 180 MG 24 hr capsule TAKE 1 CAPSULE BY MOUTH 2 TIMES DAILY. 180 capsule 3  . Glucosamine-Chondroit-Vit C-Mn (GLUCOSAMINE-CHONDROITIN MAX ST PO) Take 2 capsules by mouth daily.     Marland Kitchen losartan (COZAAR) 50 MG tablet TAKE 1 TABLET BY MOUTH DAILY. 90 tablet 1  . Multiple Vitamins-Minerals (MENS MULTIVITAMIN PLUS PO) Take 1 tablet by mouth daily.    . pantoprazole (PROTONIX) 40 MG tablet TAKE 1 TABLET BY MOUTH DAILY. 90 tablet 1  . Testosterone 12.5 MG/ACT (1%) GEL APPLY 3 PUMPS ONTO SKIN DAILY AS DIRECTED 150 g 5  . warfarin (COUMADIN) 5 MG tablet      No facility-administered medications prior to visit.    PE: Blood pressure 132/85, pulse 63, temperature 98 F (36.7 C), temperature source Temporal, resp. rate 16, height 5\' 5"  (1.651 m), weight 208 lb 9.6 oz (94.6 kg), SpO2 97 %. Body mass index is 34.71 kg/m.  Gen: Alert, well appearing.  Patient is oriented to person, place, time, and situation. AFFECT: pleasant, lucid thought and speech. CV: Irregular, rate about 70, no m/r/g.   LUNGS: CTA bilat, nonlabored resps, good aeration in all lung fields. EXT: no clubbing or cyanosis.  Trace bilat LL pitting edema.    LABS:  Lab Results  Component Value Date   TSH 0.60 12/01/2013   Lab Results  Component Value Date   WBC 5.8 03/19/2019   HGB 17.4 (H)  03/19/2019   HCT 51.1 03/19/2019   MCV 93.6 03/19/2019   PLT 175.0 03/19/2019   Lab Results  Component Value Date   CREATININE 1.10 03/19/2019   BUN 18 03/19/2019   NA 140 03/19/2019   K 4.6 03/19/2019   CL 102 03/19/2019   CO2 28 03/19/2019   Lab Results  Component Value Date   ALT 22 03/19/2019   AST  20 03/19/2019   ALKPHOS 54 03/19/2019   BILITOT 1.2 03/19/2019   Lab Results  Component Value Date   CHOL 216 (H) 03/19/2019   Lab Results  Component Value Date   HDL 38.50 (L) 03/19/2019   Lab Results  Component Value Date   LDLCALC 108 (H) 06/24/2018   Lab Results  Component Value Date   TRIG 266.0 (H) 03/19/2019   Lab Results  Component Value Date   CHOLHDL 6 03/19/2019   Lab Results  Component Value Date   PSA 2.19 12/21/2017   PSA 2.15 06/21/2016   PSA 2.53 12/01/2013   Lab Results  Component Value Date   HGBA1C 6.1 03/19/2019   Lab Results  Component Value Date   TESTOSTERONE 327.78 06/24/2018   IMPRESSION AND PLAN:  1) HTN: The current medical regimen is effective;  continue present plan and medications. Lytes/cr today.  2) HLD: he declines statin.  FLP and hepatic panel today.  3) CRI II/III: needs to hydrate better.  He avoids NSAIDs. Lytes/cr today.  4) Male hypogonadism: testost topical 3 d/week. Most recent 4 d/a->testost level and monitor Hb today.  5) Prediabetes: needs to be more aggressive with TLC. HbA1c and fasting glucose today.  An After Visit Summary was printed and given to the patient.  FOLLOW UP:  Return in about 6 months (around 03/18/2020) for annual CPE (fasting).  Signed:  Crissie Sickles, MD           09/19/2019

## 2019-09-22 ENCOUNTER — Encounter: Payer: Self-pay | Admitting: Family Medicine

## 2019-09-24 ENCOUNTER — Other Ambulatory Visit: Payer: Self-pay | Admitting: Family Medicine

## 2019-09-29 HISTORY — PX: OTHER SURGICAL HISTORY: SHX169

## 2019-10-27 HISTORY — PX: TRANSTHORACIC ECHOCARDIOGRAM: SHX275

## 2019-11-06 ENCOUNTER — Other Ambulatory Visit: Payer: Self-pay | Admitting: Family Medicine

## 2019-12-10 ENCOUNTER — Encounter: Payer: Self-pay | Admitting: Family Medicine

## 2020-03-02 ENCOUNTER — Encounter: Payer: Self-pay | Admitting: Family Medicine

## 2020-03-02 ENCOUNTER — Other Ambulatory Visit: Payer: Self-pay | Admitting: Family Medicine

## 2020-04-09 ENCOUNTER — Encounter: Payer: Self-pay | Admitting: Family Medicine

## 2020-04-09 ENCOUNTER — Ambulatory Visit (INDEPENDENT_AMBULATORY_CARE_PROVIDER_SITE_OTHER): Payer: PPO | Admitting: Family Medicine

## 2020-04-09 ENCOUNTER — Other Ambulatory Visit: Payer: Self-pay

## 2020-04-09 VITALS — BP 151/94 | HR 61 | Temp 97.9°F | Resp 16 | Wt 205.2 lb

## 2020-04-09 DIAGNOSIS — I10 Essential (primary) hypertension: Secondary | ICD-10-CM

## 2020-04-09 DIAGNOSIS — Z7901 Long term (current) use of anticoagulants: Secondary | ICD-10-CM

## 2020-04-09 DIAGNOSIS — E291 Testicular hypofunction: Secondary | ICD-10-CM | POA: Diagnosis not present

## 2020-04-09 DIAGNOSIS — I4891 Unspecified atrial fibrillation: Secondary | ICD-10-CM

## 2020-04-09 DIAGNOSIS — N182 Chronic kidney disease, stage 2 (mild): Secondary | ICD-10-CM | POA: Diagnosis not present

## 2020-04-09 DIAGNOSIS — R7303 Prediabetes: Secondary | ICD-10-CM

## 2020-04-09 LAB — CBC WITH DIFFERENTIAL/PLATELET
Basophils Absolute: 0.1 10*3/uL (ref 0.0–0.1)
Basophils Relative: 1.1 % (ref 0.0–3.0)
Eosinophils Absolute: 0.3 10*3/uL (ref 0.0–0.7)
Eosinophils Relative: 4.1 % (ref 0.0–5.0)
HCT: 52.6 % — ABNORMAL HIGH (ref 39.0–52.0)
Hemoglobin: 17.6 g/dL — ABNORMAL HIGH (ref 13.0–17.0)
Lymphocytes Relative: 34.5 % (ref 12.0–46.0)
Lymphs Abs: 2.2 10*3/uL (ref 0.7–4.0)
MCHC: 33.5 g/dL (ref 30.0–36.0)
MCV: 92.7 fl (ref 78.0–100.0)
Monocytes Absolute: 0.6 10*3/uL (ref 0.1–1.0)
Monocytes Relative: 10 % (ref 3.0–12.0)
Neutro Abs: 3.2 10*3/uL (ref 1.4–7.7)
Neutrophils Relative %: 50.3 % (ref 43.0–77.0)
Platelets: 179 10*3/uL (ref 150.0–400.0)
RBC: 5.67 Mil/uL (ref 4.22–5.81)
RDW: 14.3 % (ref 11.5–15.5)
WBC: 6.4 10*3/uL (ref 4.0–10.5)

## 2020-04-09 LAB — HEMOGLOBIN A1C: Hgb A1c MFr Bld: 6 % (ref 4.6–6.5)

## 2020-04-09 LAB — BASIC METABOLIC PANEL
BUN: 21 mg/dL (ref 6–23)
CO2: 30 mEq/L (ref 19–32)
Calcium: 10.1 mg/dL (ref 8.4–10.5)
Chloride: 101 mEq/L (ref 96–112)
Creatinine, Ser: 1.2 mg/dL (ref 0.40–1.50)
GFR: 59.07 mL/min — ABNORMAL LOW (ref >60.00)
Glucose, Bld: 112 mg/dL — ABNORMAL HIGH (ref 70–99)
Potassium: 4.6 mEq/L (ref 3.5–5.1)
Sodium: 141 mEq/L (ref 135–145)

## 2020-04-09 MED ORDER — PANTOPRAZOLE SODIUM 40 MG PO TBEC: 40.0000 mg | DELAYED_RELEASE_TABLET | Freq: Every day | ORAL | 3 refills | Status: DC

## 2020-04-09 MED ORDER — LOSARTAN POTASSIUM 50 MG PO TABS: 50.0000 mg | ORAL_TABLET | Freq: Every day | ORAL | 3 refills | Status: DC

## 2020-04-09 MED ORDER — BISOPROLOL-HYDROCHLOROTHIAZIDE 5-6.25 MG PO TABS: 1.0000 | ORAL_TABLET | Freq: Every day | ORAL | 3 refills | Status: DC

## 2020-04-09 NOTE — Progress Notes (Signed)
OFFICE VISIT  04/09/2020   CC:  Chief Complaint  Patient presents with  . Follow-up    rci   HPI:    Patient is a 74 y.o. Caucasian male who presents for 6 mo f/u HTN, prediabetes, CRI II/III, and male hypogonadism. He has a-fib and is on rate control and coumadin, +hx nonobst CAD, followed by cardiology.  BP at home 2 d/a 134/84. Couple weeks prior to that, was <130/80. Fairly active in yard but nothing exertional, no exercise.   Trying to drink a lot of fluids, avoids NSAIDs. Taking his testost gel a few days a week. Diet is fair.  ROS: mild chronic fatigue, no fevers, no CP, no SOB, no wheezing, no cough, no dizziness, no HAs, no rashes, no melena/hematochezia.  No polyuria or polydipsia.  No myalgias or arthralgias.  No focal weakness, paresthesias, or tremors.  No acute vision or hearing abnormalities. No n/v/d or abd pain.  No palpitations.    Past Medical History:  Diagnosis Date  . BPH (benign prostatic hyperplasia)   . Cataract    Right; impairing vision as of 05/2017. (has ophthalmologist)  . Chronic renal insufficiency, stage II (mild)    CrCl in the 60s  . Coronary artery disease   . GERD (gastroesophageal reflux disease)   . Hearing loss    In a study at [facility] as of 2019  . Hx of colonic polyps    03/2017--adenomatous.  Recall 03/2020 ([facility]).  . Hypercholesteremia    Pt refuses statins (as of 05/2016). Declined again 02/2019.  Marland Kitchen Hypertension   . IBS (irritable bowel syndrome)   . OSA on CPAP    Gala Murdoch, NP at Neurology-Sleep Medicine at [facility]  . Peripheral vascular disease ([place])   . Permanent atrial fibrillation ([place]) 2001   failed DCCV per pt.  Rate control + coumadin--monitored by cardiologist.  . Polycythemia    Pt states he had w/u for hemachromatosis but it was NEG.  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  . Prediabetes 06/22/2015   123.  A1c was 5.9%.  Stable at 6.0% 05/2016.  A1c 6.0% 11/2017.  5.9% 08/2019.  Marland Kitchen Prostate cancer screening    Pt declines any further prostate ca screening as of 05/2015.  Marland Kitchen Reactive airway disease   . Scoliosis   . Testosterone deficiency   . Tick bite    tick removed from scrotum 02/08/20 (Deer tick per urgent care provider)    Past Surgical History:  Procedure Laterality Date  . aortic ultrasound  09/2019   AAA screening; prox aorta 3cm->ULN, no aneurism.  Marland Kitchen CARDIAC CATHETERIZATION  approx 2001   nonobstruc CAD  . CIRCUMCISION    . COLONOSCOPY W/ POLYPECTOMY  05/12/11; 04/27/17   2018 adenomatous polyp; repeat 3 yrs (03/2020)  . NASAL POLYP SURGERY    . SKIN CANCER EXCISION     Non-malanoma  . TRANSTHORACIC ECHOCARDIOGRAM  10/2019   EF 55-60%, nl LV syst fxn, severe LA and RA dilation, valves fine.  Marland Kitchen VASECTOMY  1983  . VPAP  03/2013   14/4, EEP=11    Outpatient Medications Prior to Visit  Medication Sig Dispense Refill  . Calcium Carbonate-Vitamin D (CALCIUM 600+D) 600-200 MG-UNIT TABS Take 1 tablet by mouth 2 (two) times daily.    Marland Kitchen diltiazem (CARDIZEM CD) 180 MG 24 hr capsule TAKE 1 CAPSULE BY MOUTH 2 TIMES DAILY. 180 capsule 3  . Glucosamine-Chondroit-Vit C-Mn (GLUCOSAMINE-CHONDROITIN MAX ST PO) Take 2  capsules by mouth daily.     . Multiple Vitamins-Minerals (MENS MULTIVITAMIN PLUS PO) Take 1 tablet by mouth daily.    . Testosterone 12.5 MG/ACT (1%) GEL APPLY 3 PUMPS ONTO SKIN DAILY AS DIRECTED 150 g 5  . warfarin (COUMADIN) 5 MG tablet     . bisoprolol-hydrochlorothiazide (ZIAC) 5-6.25 MG tablet TAKE 1 TABLET BY MOUTH DAILY. 90 tablet 0  . losartan (COZAAR) 50 MG tablet Take 1 tablet (50 mg total) by mouth daily. OFFICE VISIT NEEDED 30 tablet 0  . pantoprazole (PROTONIX) 40 MG tablet TAKE 1 TABLET BY MOUTH DAILY. 90 tablet 1   No facility-administered medications prior to visit.    Allergies  Allergen Reactions  . Lac Bovis Other (See Comments)    Causes reflux if consumed in the pm.  . Atorvastatin     REACTION: INTOL to  Lipitor  . Pollen Extract Other (See Comments)    Stuffy nose    ROS As per HPI  PE: Vitals with BMI 04/09/2020 09/19/2019 03/19/2019  Height - 5\' 5"  5\' 5"   Weight 205 lbs 3 oz 208 lbs 10 oz 210 lbs  BMI - 91.47 82.95  Systolic 621 308 657  Diastolic 94 85 84  Pulse 61 63 62  O2 sat on RA 97% today BP recheck at end of o/v today was 140/82  Gen: Alert, well appearing.  Patient is oriented to person, place, time, and situation. AFFECT: pleasant, lucid thought and speech. CV: irreg irreg, rate about 60, no m/r/g. Chest is clear, no wheezing or rales. Normal symmetric air entry throughout both lung fields. No chest wall deformities or tenderness. EXT: no clubbing or cyanosis.  no edema.    LABS:  Lab Results  Component Value Date   TSH 0.60 12/01/2013   Lab Results  Component Value Date   WBC 5.7 09/19/2019   HGB 16.8 09/19/2019   HCT 50.0 09/19/2019   MCV 92.5 09/19/2019   PLT 183.0 09/19/2019   Lab Results  Component Value Date   CREATININE 1.07 09/19/2019   BUN 19 09/19/2019   NA 139 09/19/2019   K 4.4 09/19/2019   CL 101 09/19/2019   CO2 29 09/19/2019   Lab Results  Component Value Date   ALT 19 09/19/2019   AST 17 09/19/2019   ALKPHOS 56 09/19/2019   BILITOT 0.9 09/19/2019   Lab Results  Component Value Date   CHOL 208 (H) 09/19/2019   Lab Results  Component Value Date   HDL 38.30 (L) 09/19/2019   Lab Results  Component Value Date   LDLCALC 108 (H) 06/24/2018   Lab Results  Component Value Date   TRIG 217.0 (H) 09/19/2019   Lab Results  Component Value Date   CHOLHDL 5 09/19/2019   Lab Results  Component Value Date   PSA 2.19 12/21/2017   PSA 2.15 06/21/2016   PSA 2.53 12/01/2013   Lab Results  Component Value Date   HGBA1C 5.9 09/19/2019   Lab Results  Component Value Date   TESTOSTERONE 196.27 (L) 09/19/2019    IMPRESSION AND PLAN:  1) HTN: stable. No med changes. BMET today.  2) Prediabetes: not doing strict diet, not  exercising: encouraged both today. Hba1c and fasting glucose today.  3) CRI II/III: avoids NSAIDs, hydrating better. Lytes/cr today.  4) Hypogonadism: testost gel a few days a week only. Some help from this.  Hb monitoring today.  5) Permanent a-fib: doing well on rate control, coumadin.  Keep cards routine f/u.  CBC and bmet today.  An After Visit Summary was printed and given to the patient.  FOLLOW UP: Return in about 6 months (around 10/10/2020) for annual CPE (fasting).  Signed:  Crissie Sickles, MD           04/09/2020

## 2020-06-03 DIAGNOSIS — Z23 Encounter for immunization: Secondary | ICD-10-CM | POA: Diagnosis not present

## 2020-06-03 DIAGNOSIS — Z5181 Encounter for therapeutic drug level monitoring: Secondary | ICD-10-CM | POA: Diagnosis not present

## 2020-06-03 DIAGNOSIS — Z7901 Long term (current) use of anticoagulants: Secondary | ICD-10-CM | POA: Diagnosis not present

## 2020-06-03 DIAGNOSIS — I482 Chronic atrial fibrillation, unspecified: Secondary | ICD-10-CM | POA: Diagnosis not present

## 2020-07-12 DIAGNOSIS — Z5181 Encounter for therapeutic drug level monitoring: Secondary | ICD-10-CM | POA: Diagnosis not present

## 2020-07-12 DIAGNOSIS — Z7901 Long term (current) use of anticoagulants: Secondary | ICD-10-CM | POA: Diagnosis not present

## 2020-07-27 DIAGNOSIS — L818 Other specified disorders of pigmentation: Secondary | ICD-10-CM | POA: Diagnosis not present

## 2020-07-27 DIAGNOSIS — D229 Melanocytic nevi, unspecified: Secondary | ICD-10-CM | POA: Diagnosis not present

## 2020-07-27 DIAGNOSIS — L814 Other melanin hyperpigmentation: Secondary | ICD-10-CM | POA: Diagnosis not present

## 2020-07-27 DIAGNOSIS — Z1283 Encounter for screening for malignant neoplasm of skin: Secondary | ICD-10-CM | POA: Diagnosis not present

## 2020-07-27 DIAGNOSIS — L570 Actinic keratosis: Secondary | ICD-10-CM | POA: Diagnosis not present

## 2020-07-27 DIAGNOSIS — L918 Other hypertrophic disorders of the skin: Secondary | ICD-10-CM | POA: Diagnosis not present

## 2020-07-27 DIAGNOSIS — Z85828 Personal history of other malignant neoplasm of skin: Secondary | ICD-10-CM | POA: Diagnosis not present

## 2020-08-05 DIAGNOSIS — Z961 Presence of intraocular lens: Secondary | ICD-10-CM | POA: Diagnosis not present

## 2020-08-30 DIAGNOSIS — Z5181 Encounter for therapeutic drug level monitoring: Secondary | ICD-10-CM | POA: Diagnosis not present

## 2020-08-30 DIAGNOSIS — I4891 Unspecified atrial fibrillation: Secondary | ICD-10-CM | POA: Diagnosis not present

## 2020-08-30 DIAGNOSIS — Z7901 Long term (current) use of anticoagulants: Secondary | ICD-10-CM | POA: Diagnosis not present

## 2020-10-11 DIAGNOSIS — Z7901 Long term (current) use of anticoagulants: Secondary | ICD-10-CM | POA: Diagnosis not present

## 2020-10-11 DIAGNOSIS — I4891 Unspecified atrial fibrillation: Secondary | ICD-10-CM | POA: Diagnosis not present

## 2020-10-11 DIAGNOSIS — Z5181 Encounter for therapeutic drug level monitoring: Secondary | ICD-10-CM | POA: Diagnosis not present

## 2020-10-20 DIAGNOSIS — Z888 Allergy status to other drugs, medicaments and biological substances status: Secondary | ICD-10-CM | POA: Diagnosis not present

## 2020-10-20 DIAGNOSIS — R9431 Abnormal electrocardiogram [ECG] [EKG]: Secondary | ICD-10-CM | POA: Diagnosis not present

## 2020-10-20 DIAGNOSIS — Z79899 Other long term (current) drug therapy: Secondary | ICD-10-CM | POA: Diagnosis not present

## 2020-10-20 DIAGNOSIS — Z87891 Personal history of nicotine dependence: Secondary | ICD-10-CM | POA: Diagnosis not present

## 2020-10-20 DIAGNOSIS — I482 Chronic atrial fibrillation, unspecified: Secondary | ICD-10-CM | POA: Diagnosis not present

## 2020-10-20 DIAGNOSIS — I2510 Atherosclerotic heart disease of native coronary artery without angina pectoris: Secondary | ICD-10-CM | POA: Diagnosis not present

## 2020-10-20 DIAGNOSIS — Z7901 Long term (current) use of anticoagulants: Secondary | ICD-10-CM | POA: Diagnosis not present

## 2020-10-20 DIAGNOSIS — I10 Essential (primary) hypertension: Secondary | ICD-10-CM | POA: Diagnosis not present

## 2020-11-18 DIAGNOSIS — Z5181 Encounter for therapeutic drug level monitoring: Secondary | ICD-10-CM | POA: Diagnosis not present

## 2020-11-18 DIAGNOSIS — I4891 Unspecified atrial fibrillation: Secondary | ICD-10-CM | POA: Diagnosis not present

## 2020-11-18 DIAGNOSIS — Z7901 Long term (current) use of anticoagulants: Secondary | ICD-10-CM | POA: Diagnosis not present

## 2020-12-08 DIAGNOSIS — Z23 Encounter for immunization: Secondary | ICD-10-CM | POA: Diagnosis not present

## 2020-12-18 ENCOUNTER — Other Ambulatory Visit: Payer: Self-pay | Admitting: Family Medicine

## 2021-01-03 ENCOUNTER — Other Ambulatory Visit: Payer: Self-pay | Admitting: Family Medicine

## 2021-01-03 DIAGNOSIS — R791 Abnormal coagulation profile: Secondary | ICD-10-CM | POA: Diagnosis not present

## 2021-01-03 DIAGNOSIS — Z7901 Long term (current) use of anticoagulants: Secondary | ICD-10-CM | POA: Diagnosis not present

## 2021-01-03 DIAGNOSIS — Z5181 Encounter for therapeutic drug level monitoring: Secondary | ICD-10-CM | POA: Diagnosis not present

## 2021-01-03 DIAGNOSIS — I482 Chronic atrial fibrillation, unspecified: Secondary | ICD-10-CM | POA: Diagnosis not present

## 2021-01-11 ENCOUNTER — Other Ambulatory Visit: Payer: Self-pay

## 2021-01-12 ENCOUNTER — Encounter: Payer: Self-pay | Admitting: Family Medicine

## 2021-01-12 ENCOUNTER — Other Ambulatory Visit: Payer: Self-pay

## 2021-01-12 ENCOUNTER — Other Ambulatory Visit: Payer: Self-pay | Admitting: Family Medicine

## 2021-01-12 ENCOUNTER — Ambulatory Visit (INDEPENDENT_AMBULATORY_CARE_PROVIDER_SITE_OTHER): Payer: PPO | Admitting: Family Medicine

## 2021-01-12 VITALS — BP 125/72 | HR 54 | Temp 98.4°F | Resp 16 | Ht 65.0 in | Wt 210.2 lb

## 2021-01-12 DIAGNOSIS — I482 Chronic atrial fibrillation, unspecified: Secondary | ICD-10-CM | POA: Diagnosis not present

## 2021-01-12 DIAGNOSIS — Z86010 Personal history of colonic polyps: Secondary | ICD-10-CM | POA: Diagnosis not present

## 2021-01-12 DIAGNOSIS — R059 Cough, unspecified: Secondary | ICD-10-CM | POA: Diagnosis not present

## 2021-01-12 DIAGNOSIS — R5382 Chronic fatigue, unspecified: Secondary | ICD-10-CM | POA: Diagnosis not present

## 2021-01-12 DIAGNOSIS — R0982 Postnasal drip: Secondary | ICD-10-CM | POA: Diagnosis not present

## 2021-01-12 DIAGNOSIS — N182 Chronic kidney disease, stage 2 (mild): Secondary | ICD-10-CM

## 2021-01-12 DIAGNOSIS — I10 Essential (primary) hypertension: Secondary | ICD-10-CM | POA: Diagnosis not present

## 2021-01-12 DIAGNOSIS — R7303 Prediabetes: Secondary | ICD-10-CM

## 2021-01-12 DIAGNOSIS — E291 Testicular hypofunction: Secondary | ICD-10-CM

## 2021-01-12 DIAGNOSIS — J309 Allergic rhinitis, unspecified: Secondary | ICD-10-CM

## 2021-01-12 LAB — CBC
HCT: 50.6 % (ref 39.0–52.0)
Hemoglobin: 17.3 g/dL — ABNORMAL HIGH (ref 13.0–17.0)
MCHC: 34.3 g/dL (ref 30.0–36.0)
MCV: 91.7 fl (ref 78.0–100.0)
Platelets: 182 10*3/uL (ref 150.0–400.0)
RBC: 5.51 Mil/uL (ref 4.22–5.81)
RDW: 13.7 % (ref 11.5–15.5)
WBC: 7.3 10*3/uL (ref 4.0–10.5)

## 2021-01-12 LAB — BASIC METABOLIC PANEL
BUN: 20 mg/dL (ref 6–23)
CO2: 30 mEq/L (ref 19–32)
Calcium: 9.5 mg/dL (ref 8.4–10.5)
Chloride: 103 mEq/L (ref 96–112)
Creatinine, Ser: 1.15 mg/dL (ref 0.40–1.50)
GFR: 62.27 mL/min (ref >60.00)
Glucose, Bld: 93 mg/dL (ref 70–99)
Potassium: 4.5 mEq/L (ref 3.5–5.1)
Sodium: 141 mEq/L (ref 135–145)

## 2021-01-12 LAB — TESTOSTERONE: Testosterone: 221.58 ng/dL — ABNORMAL LOW (ref 300.00–890.00)

## 2021-01-12 LAB — HEMOGLOBIN A1C: Hgb A1c MFr Bld: 6.2 % (ref 4.6–6.5)

## 2021-01-12 MED ORDER — DILTIAZEM HCL ER COATED BEADS 180 MG PO CP24: 180.0000 mg | ORAL_CAPSULE | Freq: Two times a day (BID) | ORAL | 3 refills | Status: DC

## 2021-01-12 MED ORDER — PANTOPRAZOLE SODIUM 40 MG PO TBEC: 40.0000 mg | DELAYED_RELEASE_TABLET | Freq: Every day | ORAL | 3 refills | Status: DC

## 2021-01-12 MED ORDER — LOSARTAN POTASSIUM 50 MG PO TABS: 50.0000 mg | ORAL_TABLET | Freq: Every day | ORAL | 3 refills | Status: DC

## 2021-01-12 MED ORDER — TESTOSTERONE 12.5 MG/ACT (1%) TD GEL: TRANSDERMAL | 5 refills | Status: DC

## 2021-01-12 NOTE — Progress Notes (Signed)
OFFICE VISIT  01/12/2021  CC:  Chief Complaint  Patient presents with  . Follow-up    HTN. Pt is not fasting    HPI:    Patient is a 75 y.o. Caucasian male who presents for f/u HTN, prediabetes, CRI II/III, and male hypogonadism. He has a-fib and is on rate control and coumadin, +hx nonobst CAD, followed by cardiology. I last saw him 04/09/20. A/P as of that visit: "1) HTN: stable. No med changes. BMET today.  2) Prediabetes: not doing strict diet, not exercising: encouraged both today. Hba1c and fasting glucose today.  3) CRI II/III: avoids NSAIDs, hydrating better. Lytes/cr today.  4) Hypogonadism: testost gel a few days a week only. Some help from this.  Hb monitoring today.  5) Permanent a-fib: doing well on rate control, coumadin.  Keep cards routine f/u. CBC and bmet today."  INTERIM HX: Constant cough x "years", PND.  Wife hears him wheeze.  Never smoker, no hx of asthma.  No SOB or chest tightness during times of wheezing. More fatigued lately.  HTN: some home bp monitoring, seems to be normal 1/2 the time and up into 150-90s at times.  Has been out of testost for a month.  Prior to that was using it 2-3 days a week. He is active in the yard.  He does wear his cpap regularly.  He does feel the irregularity of his heart but never feels it racing.  Gets very tired/worn out some days with doing lots of yard work but some days is not.  No SOB, no CP. Takes coumadin as rx'd, followed by coumadin clinic at [facility].   Past Medical History:  Diagnosis Date  . BPH (benign prostatic hyperplasia)   . Cataract    Right; impairing vision as of 05/2017. (has ophthalmologist)  . Chronic renal insufficiency, stage II (mild)    CrCl in the 60s  . Coronary artery disease   . GERD (gastroesophageal reflux disease)   . Hearing loss    In a study at [facility] as of 2019  . Hx of colonic polyps    03/2017--adenomatous.  Recall 03/2020 ([facility]).  .  Hypercholesteremia    Pt refuses statins (as of 05/2016). Declined again 02/2019.  Marland Kitchen Hypertension   . IBS (irritable bowel syndrome)   . OSA on CPAP    Gala Murdoch, NP at Neurology-Sleep Medicine at [facility]  . Peripheral vascular disease ([place])   . Permanent atrial fibrillation ([place]) 2001   failed DCCV per pt.  Rate control + coumadin--monitored by cardiologist.  . Polycythemia    Pt states he had w/u for hemachromatosis but it was NEG.  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  . Prediabetes 06/22/2015   123.  A1c was 5.9%.  Stable at 6.0% 05/2016.  A1c 6.0% 11/2017. 5.9% 08/2019.  Marland Kitchen Prostate cancer screening    Pt declines any further prostate ca screening as of 05/2015.  Marland Kitchen Reactive airway disease   . Scoliosis   . Testosterone deficiency   . Tick bite    tick removed from scrotum 02/08/20 (Deer tick per urgent care provider)    Past Surgical History:  Procedure Laterality Date  . aortic ultrasound  09/2019   AAA screening; prox aorta 3cm->ULN, no aneurism.  Marland Kitchen CARDIAC CATHETERIZATION  approx 2001   nonobstruc CAD  . CIRCUMCISION    . COLONOSCOPY W/ POLYPECTOMY  05/12/11; 04/27/17   2018 adenomatous polyp; repeat 3 yrs (03/2020)  .  NASAL POLYP SURGERY    . SKIN CANCER EXCISION     Non-malanoma  . TRANSTHORACIC ECHOCARDIOGRAM  10/2019   EF 55-60%, nl LV syst fxn, severe LA and RA dilation, valves fine.  Marland Kitchen VASECTOMY  1983  . VPAP  03/2013   14/4, EEP=11    Outpatient Medications Prior to Visit  Medication Sig Dispense Refill  . bisoprolol-hydrochlorothiazide (ZIAC) 5-6.25 MG tablet Take 1 tablet by mouth daily. 90 tablet 3  . Calcium Carbonate-Vitamin D 600-200 MG-UNIT TABS Take 1 tablet by mouth 2 (two) times daily.    . Glucosamine-Chondroit-Vit C-Mn (GLUCOSAMINE-CHONDROITIN MAX ST PO) Take 2 capsules by mouth daily.     . Multiple Vitamins-Minerals (MENS MULTIVITAMIN PLUS PO) Take 1 tablet by mouth daily.    Marland Kitchen warfarin (COUMADIN) 2.5 MG tablet  Take by mouth.    . diltiazem (CARDIZEM CD) 180 MG 24 hr capsule TAKE 1 CAPSULE BY MOUTH 2 TIMES DAILY. 180 capsule 3  . losartan (COZAAR) 50 MG tablet Take 1 tablet (50 mg total) by mouth daily. 90 tablet 3  . pantoprazole (PROTONIX) 40 MG tablet Take 1 tablet (40 mg total) by mouth daily. 90 tablet 3  . Testosterone 12.5 MG/ACT (1%) GEL APPLY 3 PUMPS ONTO SKIN DAILY AS DIRECTED 150 g 5  . warfarin (COUMADIN) 5 MG tablet  (Patient not taking: Reported on 01/12/2021)     No facility-administered medications prior to visit.    Allergies  Allergen Reactions  . Lac Bovis Other (See Comments)    Causes reflux if consumed in the pm.  . Atorvastatin     REACTION: INTOL to Lipitor  . Pollen Extract Other (See Comments)    Stuffy nose    ROS As per HPI  PE: Vitals with BMI 01/12/2021 04/09/2020 09/19/2019  Height 5\' 5"  - 5\' 5"   Weight 210 lbs 3 oz 205 lbs 3 oz 208 lbs 10 oz  BMI 69.67 - 89.38  Systolic 101 751 025  Diastolic 72 94 85  Pulse 54 61 63   Gen: Alert, well appearing.  Patient is oriented to person, place, time, and situation. AFFECT: pleasant, lucid thought and speech. CV: Irreg irreg, rate 50-60, no m/r/g.   LUNGS: CTA bilat, nonlabored resps, good aeration in all lung fields. EXT: no clubbing or cyanosis.  no edema.    LABS:  Lab Results  Component Value Date   TSH 0.60 12/01/2013   Lab Results  Component Value Date   WBC 6.4 04/09/2020   HGB 17.6 (H) 04/09/2020   HCT 52.6 (H) 04/09/2020   MCV 92.7 04/09/2020   PLT 179.0 04/09/2020   Lab Results  Component Value Date   IRON 132 09/15/2011    Lab Results  Component Value Date   CREATININE 1.20 04/09/2020   BUN 21 04/09/2020   NA 141 04/09/2020   K 4.6 04/09/2020   CL 101 04/09/2020   CO2 30 04/09/2020   Lab Results  Component Value Date   ALT 19 09/19/2019   AST 17 09/19/2019   ALKPHOS 56 09/19/2019   BILITOT 0.9 09/19/2019   Lab Results  Component Value Date   CHOL 208 (H) 09/19/2019    Lab Results  Component Value Date   HDL 38.30 (L) 09/19/2019   Lab Results  Component Value Date   LDLCALC 108 (H) 06/24/2018   Lab Results  Component Value Date   TRIG 217.0 (H) 09/19/2019   Lab Results  Component Value Date   CHOLHDL 5 09/19/2019  Lab Results  Component Value Date   PSA 2.19 12/21/2017   PSA 2.15 06/21/2016   PSA 2.53 12/01/2013   Lab Results  Component Value Date   HGBA1C 6.0 04/09/2020   Lab Results  Component Value Date   TESTOSTERONE 196.27 (L) 09/19/2019   IMPRESSION AND PLAN:  1) HTN: reasonably well controlled. Cont losartan 50mg  qd, bisop-hct 5-6.25 1 bid, and cardizem cd 180mg  bid. Lytes/cr today.  2) CRI II/III: avoids NSAIDs. Lytes/cr today.  3) Prediabetes: Hba1c monitoring today (nonfasting glucose as well).  4) chronic allergic rhinitis with PND cough. Reassured.  He is willing to just live with the sx's at this point--no meds.  5) Male hypogonadism: off testost x 70mo b/c ran out. Was on 3 x/week dosing prior (gel). Restart testost. Testost level check + cbc today.  6) hx of adenomatous polyps, overdue for repeat colonoscopy-->Ref back to [place] for rpt colonoscopy.  7) Chronic fatigue: suspect multifactorial-->deconditioning/overweight, hypogonadism, a-fib. No new w/u at this time.  An After Visit Summary was printed and given to the patient.  FOLLOW UP: Return in about 6 months (around 07/15/2021) for routine chronic illness f/u.  Signed:  Crissie Sickles, MD           01/12/2021

## 2021-01-19 ENCOUNTER — Ambulatory Visit (INDEPENDENT_AMBULATORY_CARE_PROVIDER_SITE_OTHER): Payer: PPO

## 2021-01-19 VITALS — Ht 65.0 in | Wt 210.0 lb

## 2021-01-19 DIAGNOSIS — Z0000 Encounter for general adult medical examination without abnormal findings: Secondary | ICD-10-CM | POA: Diagnosis not present

## 2021-01-19 NOTE — Patient Instructions (Signed)
Jeffrey Little , Thank you for taking time to complete your Medicare Wellness Visit. I appreciate your ongoing commitment to your health goals. Please review the following plan we discussed and let me know if I can assist you in the future.   Screening recommendations/referrals: Colonoscopy: Due-Waiting for GI to call & schedule Recommended yearly ophthalmology/optometry visit for glaucoma screening and checkup Recommended yearly dental visit for hygiene and checkup  Vaccinations: Influenza vaccine: Up to date Pneumococcal vaccine: Completed vaccines Tdap vaccine: Up to date-Due-09/14/2021 Shingles vaccine: Discuss with pharmacy   Covid-19: Up to date  Advanced directives: Please bring a copy for your chart  Conditions/risks identified: See problem list  Next appointment: Follow up in one year for your annual wellness visit.   Preventive Care 52 Years and Older, Male Preventive care refers to lifestyle choices and visits with your health care provider that can promote health and wellness. What does preventive care include?  A yearly physical exam. This is also called an annual well check.  Dental exams once or twice a year.  Routine eye exams. Ask your health care provider how often you should have your eyes checked.  Personal lifestyle choices, including:  Daily care of your teeth and gums.  Regular physical activity.  Eating a healthy diet.  Avoiding tobacco and drug use.  Limiting alcohol use.  Practicing safe sex.  Taking low doses of aspirin every day.  Taking vitamin and mineral supplements as recommended by your health care provider. What happens during an annual well check? The services and screenings done by your health care provider during your annual well check will depend on your age, overall health, lifestyle risk factors, and family history of disease. Counseling  Your health care provider may ask you questions about your:  Alcohol use.  Tobacco  use.  Drug use.  Emotional well-being.  Home and relationship well-being.  Sexual activity.  Eating habits.  History of falls.  Memory and ability to understand (cognition).  Work and work Statistician. Screening  You may have the following tests or measurements:  Height, weight, and BMI.  Blood pressure.  Lipid and cholesterol levels. These may be checked every 5 years, or more frequently if you are over 6 years old.  Skin check.  Lung cancer screening. You may have this screening every year starting at age 50 if you have a 30-pack-year history of smoking and currently smoke or have quit within the past 15 years.  Fecal occult blood test (FOBT) of the stool. You may have this test every year starting at age 73.  Flexible sigmoidoscopy or colonoscopy. You may have a sigmoidoscopy every 5 years or a colonoscopy every 10 years starting at age 41.  Prostate cancer screening. Recommendations will vary depending on your family history and other risks.  Hepatitis C blood test.  Hepatitis B blood test.  Sexually transmitted disease (STD) testing.  Diabetes screening. This is done by checking your blood sugar (glucose) after you have not eaten for a while (fasting). You may have this done every 1-3 years.  Abdominal aortic aneurysm (AAA) screening. You may need this if you are a current or former smoker.  Osteoporosis. You may be screened starting at age 63 if you are at high risk. Talk with your health care provider about your test results, treatment options, and if necessary, the need for more tests. Vaccines  Your health care provider may recommend certain vaccines, such as:  Influenza vaccine. This is recommended every year.  Tetanus,  diphtheria, and acellular pertussis (Tdap, Td) vaccine. You may need a Td booster every 10 years.  Zoster vaccine. You may need this after age 37.  Pneumococcal 13-valent conjugate (PCV13) vaccine. One dose is recommended after age  61.  Pneumococcal polysaccharide (PPSV23) vaccine. One dose is recommended after age 79. Talk to your health care provider about which screenings and vaccines you need and how often you need them. This information is not intended to replace advice given to you by your health care provider. Make sure you discuss any questions you have with your health care provider. Document Released: 09/10/2015 Document Revised: 05/03/2016 Document Reviewed: 06/15/2015 Elsevier Interactive Patient Education  2017 [place] Prevention in the Home Falls can cause injuries. They can happen to people of all ages. There are many things you can do to make your home safe and to help prevent falls. What can I do on the outside of my home?  Regularly fix the edges of walkways and driveways and fix any cracks.  Remove anything that might make you trip as you walk through a door, such as a raised step or threshold.  Trim any bushes or trees on the path to your home.  Use bright outdoor lighting.  Clear any walking paths of anything that might make someone trip, such as rocks or tools.  Regularly check to see if handrails are loose or broken. Make sure that both sides of any steps have handrails.  Any raised decks and porches should have guardrails on the edges.  Have any leaves, snow, or ice cleared regularly.  Use sand or salt on walking paths during winter.  Clean up any spills in your garage right away. This includes oil or grease spills. What can I do in the bathroom?  Use night lights.  Install grab bars by the toilet and in the tub and shower. Do not use towel bars as grab bars.  Use non-skid mats or decals in the tub or shower.  If you need to sit down in the shower, use a plastic, non-slip stool.  Keep the floor dry. Clean up any water that spills on the floor as soon as it happens.  Remove soap buildup in the tub or shower regularly.  Attach bath mats securely with double-sided  non-slip rug tape.  Do not have throw rugs and other things on the floor that can make you trip. What can I do in the bedroom?  Use night lights.  Make sure that you have a light by your bed that is easy to reach.  Do not use any sheets or blankets that are too big for your bed. They should not hang down onto the floor.  Have a firm chair that has side arms. You can use this for support while you get dressed.  Do not have throw rugs and other things on the floor that can make you trip. What can I do in the kitchen?  Clean up any spills right away.  Avoid walking on wet floors.  Keep items that you use a lot in easy-to-reach places.  If you need to reach something above you, use a strong step stool that has a grab bar.  Keep electrical cords out of the way.  Do not use floor polish or wax that makes floors slippery. If you must use wax, use non-skid floor wax.  Do not have throw rugs and other things on the floor that can make you trip. What can I do with  my stairs?  Do not leave any items on the stairs.  Make sure that there are handrails on both sides of the stairs and use them. Fix handrails that are broken or loose. Make sure that handrails are as long as the stairways.  Check any carpeting to make sure that it is firmly attached to the stairs. Fix any carpet that is loose or worn.  Avoid having throw rugs at the top or bottom of the stairs. If you do have throw rugs, attach them to the floor with carpet tape.  Make sure that you have a light switch at the top of the stairs and the bottom of the stairs. If you do not have them, ask someone to add them for you. What else can I do to help prevent falls?  Wear shoes that:  Do not have high heels.  Have rubber bottoms.  Are comfortable and fit you well.  Are closed at the toe. Do not wear sandals.  If you use a stepladder:  Make sure that it is fully opened. Do not climb a closed stepladder.  Make sure that both  sides of the stepladder are locked into place.  Ask someone to hold it for you, if possible.  Clearly mark and make sure that you can see:  Any grab bars or handrails.  First and last steps.  Where the edge of each step is.  Use tools that help you move around (mobility aids) if they are needed. These include:  Canes.  Walkers.  Scooters.  Crutches.  Turn on the lights when you go into a dark area. Replace any light bulbs as soon as they burn out.  Set up your furniture so you have a clear path. Avoid moving your furniture around.  If any of your floors are uneven, fix them.  If there are any pets around you, be aware of where they are.  Review your medicines with your doctor. Some medicines can make you feel dizzy. This can increase your chance of falling. Ask your doctor what other things that you can do to help prevent falls. This information is not intended to replace advice given to you by your health care provider. Make sure you discuss any questions you have with your health care provider. Document Released: 06/10/2009 Document Revised: 01/20/2016 Document Reviewed: 09/18/2014 Elsevier Interactive Patient Education  2017 [employer].

## 2021-01-19 NOTE — Progress Notes (Signed)
Subjective:   Jeffrey Little is a 75 y.o. male who presents for an Initial Medicare Annual Wellness Visit.  I connected with Jac today by telephone and verified that I am speaking with the correct person using two identifiers. Location patient: home Location provider: work Persons participating in the virtual visit: patient, Marine scientist.    I discussed the limitations, risks, security and privacy concerns of performing an evaluation and management service by telephone and the availability of in person appointments. I also discussed with the patient that there may be a patient responsible charge related to this service. The patient expressed understanding and verbally consented to this telephonic visit.    Interactive audio and video telecommunications were attempted between this provider and patient, however failed, due to patient having technical difficulties OR patient did not have access to video capability.  We continued and completed visit with audio only.  Some vital signs may be absent or patient reported.   Time Spent with patient on telephone encounter: 20 minutes  Review of Systems     Cardiac Risk Factors include: advanced age (>46men, >18 women);male gender;hypertension;dyslipidemia;obesity (BMI >30kg/m2)     Objective:    Today's Vitals   01/19/21 1416  Weight: 210 lb (95.3 kg)  Height: 5\' 5"  (1.651 m)   Body mass index is 34.95 kg/m.  No flowsheet data found.  Current Medications (verified) Outpatient Encounter Medications as of 01/19/2021  Medication Sig  . bisoprolol-hydrochlorothiazide (ZIAC) 5-6.25 MG tablet Take 1 tablet by mouth daily.  . Calcium Carbonate-Vitamin D 600-200 MG-UNIT TABS Take 1 tablet by mouth 2 (two) times daily.  Marland Kitchen diltiazem (CARDIZEM CD) 180 MG 24 hr capsule Take 1 capsule (180 mg total) by mouth 2 (two) times daily.  . Glucosamine-Chondroit-Vit C-Mn (GLUCOSAMINE-CHONDROITIN MAX ST PO) Take 2 capsules by mouth daily.   Marland Kitchen losartan  (COZAAR) 50 MG tablet Take 1 tablet (50 mg total) by mouth daily.  . Multiple Vitamins-Minerals (MENS MULTIVITAMIN PLUS PO) Take 1 tablet by mouth daily.  . pantoprazole (PROTONIX) 40 MG tablet Take 1 tablet (40 mg total) by mouth daily.  . Testosterone 12.5 MG/ACT (1%) GEL 3 pumps onto skin three days per week  . warfarin (COUMADIN) 2.5 MG tablet Take by mouth.   No facility-administered encounter medications on file as of 01/19/2021.    Allergies (verified) Lac bovis, Atorvastatin, and Pollen extract   History: Past Medical History:  Diagnosis Date  . BPH (benign prostatic hyperplasia)   . Cataract    Right; impairing vision as of 05/2017. (has ophthalmologist)  . Chronic renal insufficiency, stage II (mild)    CrCl in the 60s  . Coronary artery disease   . GERD (gastroesophageal reflux disease)   . Hearing loss    In a [facility] at [facility] as of 2019  . Hx of colonic polyps    03/2017--adenomatous.  Recall 03/2020 ([facility]).  . Hypercholesteremia    Pt refuses statins (as of 05/2016). Declined again 02/2019.  Marland Kitchen Hypertension   . IBS (irritable bowel syndrome)   . OSA on CPAP    Gala Murdoch, NP at Neurology-Sleep Medicine at [facility]  . Peripheral vascular disease ([place])   . Permanent atrial fibrillation ([place]) 2001   failed DCCV per pt.  Rate control + coumadin--monitored by cardiologist.  . Polycythemia    Pt states he had w/u for hemachromatosis but it was NEG.  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  . Prediabetes 06/22/2015  123.  A1c was 5.9%.  Stable at 6.0% 05/2016.  A1c 6.0% 11/2017. 5.9% 08/2019.  Marland Kitchen Prostate cancer screening    Pt declines any further prostate ca screening as of 05/2015.  Marland Kitchen Reactive airway disease   . Scoliosis   . Testosterone deficiency   . Tick bite    tick removed from scrotum 02/08/20 (Deer tick per urgent care provider)   Past Surgical History:  Procedure Laterality Date  . aortic ultrasound  09/2019    AAA screening; prox aorta 3cm->ULN, no aneurism.  Marland Kitchen CARDIAC CATHETERIZATION  approx 2001   nonobstruc CAD  . CIRCUMCISION    . COLONOSCOPY W/ POLYPECTOMY  05/12/11; 04/27/17   2018 adenomatous polyp; repeat 3 yrs (03/2020)  . NASAL POLYP SURGERY    . SKIN CANCER EXCISION     Non-malanoma  . TRANSTHORACIC ECHOCARDIOGRAM  10/2019   EF 55-60%, nl LV syst fxn, severe LA and RA dilation, valves fine.  Marland Kitchen VASECTOMY  1983  . VPAP  03/2013   14/4, EEP=11   History reviewed. No pertinent family history. Social History   Socioeconomic History  . Marital status: Married    Spouse name: Not on file  . Number of children: 2  . Years of education: Not on file  . Highest education level: Not on file  Occupational History  . Occupation: retired  Tobacco Use  . Smoking status: Former Smoker    Years: 34.00    Types: Cigars    Quit date: 08/28/1974    Years since quitting: 46.4  . Smokeless tobacco: Never Used  Substance and Sexual Activity  . Alcohol use: No  . Drug use: No  . Sexual activity: Not on file  Other Topics Concern  . Not on file  Social History Narrative   Married, 2 sons, 4 grandchildren.   Orig from [place].   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   No cigs, no alcohol, no drugs.   No exercise.   Social Determinants of Health   Financial Resource Strain: Low Risk   . Difficulty of Paying Living Expenses: Not hard at all  Food Insecurity: No Food Insecurity  . Worried About Charity fundraiser in the Last Year: Never true  . Ran Out of Food in the Last Year: Never true  Transportation Needs: No Transportation Needs  . Lack of Transportation (Medical): No  . Lack of Transportation (Non-Medical): No  Physical Activity: Insufficiently Active  . Days of Exercise per Week: 3 days  . Minutes of Exercise per Session: 30 min  Stress: No Stress Concern Present  . Feeling of Stress : Not at all  Social Connections: Socially  Integrated  . Frequency of Communication with Friends and Family: More than three times a week  . Frequency of Social Gatherings with Friends and Family: More than three times a week  . Attends Religious Services: More than 4 times per year  . Active Member of Clubs or Organizations: Yes  . Attends Archivist Meetings: 1 to 4 times per year  . Marital Status: Married    Tobacco Counseling Counseling given: Not Answered   Clinical Intake:  Pre-visit preparation completed: Yes  Pain : No/denies pain     Nutritional Status: BMI > 30  Obese Nutritional Risks: None Diabetes: No  How often do you need to have someone help you when you read instructions, pamphlets, or other written materials from your doctor or pharmacy?: 1 - Never  Diabetic?No  Interpreter Needed?: No  Information entered by :: Caroleen Hamman LPN   Activities of Daily Living In your present state of health, do you have any difficulty performing the following activities: 01/19/2021  Hearing? N  Vision? N  Difficulty concentrating or making decisions? N  Walking or climbing stairs? N  Dressing or bathing? N  Doing errands, shopping? N  Preparing Food and eating ? N  Using the Toilet? N  In the past six months, have you accidently leaked urine? N  Do you have problems with loss of bowel control? N  Managing your Medications? N  Managing your Finances? N  Housekeeping or managing your Housekeeping? N  Some recent data might be hidden    Patient Care Team: Tammi Sou, MD as PCP - General (Family Medicine) Aviva Signs, MD as Consulting Physician (Gastroenterology) Darreld Mclean, MD as Consulting Physician (Dermatology) Veneda Melter, MD as Consulting Physician (Cardiology)  Indicate any recent Medical Services you may have received from other than [facility] providers in the past year (date may be approximate).     Assessment:   This is a routine wellness examination for  Jeffrey Little.  Hearing/Vision screen  Hearing Screening   125Hz  250Hz  500Hz  1000Hz  2000Hz  3000Hz  4000Hz  6000Hz  8000Hz   Right ear:           Left ear:           Comments: Wears hearing aids  Vision Screening Comments: Last eye exam-08/2020  Dietary issues and exercise activities discussed: Current Exercise Habits: Home exercise routine, Type of exercise: walking, Time (Minutes): 30, Frequency (Times/Week): 3, Weekly Exercise (Minutes/Week): 90, Exercise limited by: None identified  Goals Addressed            This Visit's Progress   . Patient Stated       Eat healthier & increase activity      Depression Screen PHQ 2/9 Scores 01/19/2021 01/12/2021 09/19/2019 03/19/2019 12/21/2017 06/21/2016 11/25/2012  PHQ - 2 Score 0 0 0 0 2 0 0  PHQ- 9 Score - - - - 6 - -    Fall Risk Fall Risk  01/19/2021 01/12/2021 09/19/2019 03/19/2019 12/21/2017  Falls in the past year? 0 0 0 0 No  Number falls in past yr: 0 0 0 0 -  Injury with Fall? 0 0 0 0 -  Follow up Falls prevention discussed Falls evaluation completed Falls evaluation completed Falls evaluation completed -    FALL RISK PREVENTION PERTAINING TO THE HOME:  Any stairs in or around the home? Yes  If so, are there any without handrails? No  Home free of loose throw rugs in walkways, pet beds, electrical cords, etc? Yes  Adequate lighting in your home to reduce risk of falls? Yes   ASSISTIVE DEVICES UTILIZED TO PREVENT FALLS:  Life alert? No  Use of a cane, walker or w/c? No  Grab bars in the bathroom? Yes  Shower chair or bench in shower? No  Elevated toilet seat or a handicapped toilet? No   TIMED UP AND GO:  Was the test performed? No . Phone visit   Cognitive Function:Normal cognitive status assessed by  this Nurse Health Advisor. No abnormalities found.          Immunizations Immunization History  Administered Date(s) Administered  . Influenza Split 07/29/2011, 06/27/2012  . Influenza Whole 08/11/2010  . Influenza, High  Dose Seasonal PF 06/22/2015, 06/21/2016, 06/22/2017, 06/18/2018, 05/26/2019, 06/03/2020  . Influenza,inj,Quad PF,6+ Mos 06/02/2014  . Moderna Sars-Covid-2  Vaccination 09/29/2019, 10/28/2019, 07/30/2020, 12/08/2020  . PFIZER Comirnaty(Gray Top)Covid-19 Tri-Sucrose Vaccine 12/08/2020  . Pneumococcal Conjugate-13 06/02/2014  . Pneumococcal Polysaccharide-23 08/11/2010, 06/22/2017  . Tdap 09/15/2011    TDAP status: Up to date  Flu Vaccine status: Up to date  Pneumococcal vaccine status: Up to date  Covid-19 vaccine status: Completed vaccines  Qualifies for Shingles Vaccine? Yes   Zostavax completed No   Shingrix Completed?: No.    Education has been provided regarding the importance of this vaccine. Patient has been advised to call insurance company to determine out of pocket expense if they have not yet received this vaccine. Advised may also receive vaccine at local pharmacy or Health Dept. Verbalized acceptance and understanding.  Screening Tests Health Maintenance  Topic Date Due  . Hepatitis C Screening  Never done  . COLONOSCOPY (Pts 45-65yrs Insurance coverage will need to be confirmed)  04/27/2020  . INFLUENZA VACCINE  03/28/2021  . TETANUS/TDAP  09/14/2021  . COVID-19 Vaccine  Completed  . PNA vac Low Risk Adult  Completed  . HPV VACCINES  Aged Out    Health Maintenance  Health Maintenance Due  Topic Date Due  . Hepatitis C Screening  Never done  . COLONOSCOPY (Pts 45-67yrs Insurance coverage will need to be confirmed)  04/27/2020    Colorectal cancer screening: Referral to GI placed by PCP at last office visit. Pt aware the office will call re: appt.  Lung Cancer Screening: (Low Dose CT Chest recommended if Age 43-80 years, 30 pack-year currently smoking OR have quit w/in 15years.) does not qualify.     Additional Screening:  Hepatitis C Screening: does qualify; phone visit-Discuss with PCP  Vision Screening: Recommended annual ophthalmology exams for early  detection of glaucoma and other disorders of the eye. Is the patient up to date with their annual eye exam?  Yes  Who is the provider or what is the name of the office in which the patient attends annual eye exams? Pt unsure of name .   Dental Screening: Recommended annual dental exams for proper oral hygiene  Community Resource Referral / Chronic Care Management: CRR required this visit?  No   CCM required this visit?  No      Plan:     I have personally reviewed and noted the following in the patient's chart:   . Medical and social history . Use of alcohol, tobacco or illicit drugs  . Current medications and supplements including opioid prescriptions. Patient is not currently taking opioid prescriptions. . Functional ability and status . Nutritional status . Physical activity . Advanced directives . List of other physicians . Hospitalizations, surgeries, and ER visits in previous 12 months . Vitals . Screenings to include cognitive, depression, and falls . Referrals and appointments  In addition, I have reviewed and discussed with patient certain preventive protocols, quality metrics, and best practice recommendations. A written personalized care plan for preventive services as well as general preventive health recommendations were provided to patient.   Due to this being a telephonic visit, the after visit summary with patients personalized plan was offered to patient via mail or my-chart.  Patient would like to access on my-chart.   Marta Antu, LPN   [id number]  Nurse Health Advisor  Nurse Notes: None

## 2021-01-26 DIAGNOSIS — Z7901 Long term (current) use of anticoagulants: Secondary | ICD-10-CM | POA: Diagnosis not present

## 2021-01-26 DIAGNOSIS — Z5181 Encounter for therapeutic drug level monitoring: Secondary | ICD-10-CM | POA: Diagnosis not present

## 2021-01-26 DIAGNOSIS — I482 Chronic atrial fibrillation, unspecified: Secondary | ICD-10-CM | POA: Diagnosis not present

## 2021-02-14 DIAGNOSIS — I4811 Longstanding persistent atrial fibrillation: Secondary | ICD-10-CM | POA: Diagnosis not present

## 2021-02-14 DIAGNOSIS — Z7901 Long term (current) use of anticoagulants: Secondary | ICD-10-CM | POA: Diagnosis not present

## 2021-02-14 DIAGNOSIS — Z5181 Encounter for therapeutic drug level monitoring: Secondary | ICD-10-CM | POA: Diagnosis not present

## 2021-03-04 DIAGNOSIS — Z7901 Long term (current) use of anticoagulants: Secondary | ICD-10-CM | POA: Diagnosis not present

## 2021-03-04 DIAGNOSIS — Z5181 Encounter for therapeutic drug level monitoring: Secondary | ICD-10-CM | POA: Diagnosis not present

## 2021-03-04 DIAGNOSIS — I482 Chronic atrial fibrillation, unspecified: Secondary | ICD-10-CM | POA: Diagnosis not present

## 2021-03-31 ENCOUNTER — Other Ambulatory Visit: Payer: Self-pay | Admitting: Family Medicine

## 2021-04-12 DIAGNOSIS — Z7901 Long term (current) use of anticoagulants: Secondary | ICD-10-CM | POA: Diagnosis not present

## 2021-04-12 DIAGNOSIS — Z5181 Encounter for therapeutic drug level monitoring: Secondary | ICD-10-CM | POA: Diagnosis not present

## 2021-04-12 DIAGNOSIS — I482 Chronic atrial fibrillation, unspecified: Secondary | ICD-10-CM | POA: Diagnosis not present

## 2021-05-26 ENCOUNTER — Telehealth (INDEPENDENT_AMBULATORY_CARE_PROVIDER_SITE_OTHER): Payer: PPO | Admitting: Family Medicine

## 2021-05-26 VITALS — BP 104/66 | Wt 208.0 lb

## 2021-05-26 DIAGNOSIS — I482 Chronic atrial fibrillation, unspecified: Secondary | ICD-10-CM | POA: Diagnosis not present

## 2021-05-26 DIAGNOSIS — J069 Acute upper respiratory infection, unspecified: Secondary | ICD-10-CM | POA: Diagnosis not present

## 2021-05-26 DIAGNOSIS — Z5181 Encounter for therapeutic drug level monitoring: Secondary | ICD-10-CM | POA: Diagnosis not present

## 2021-05-26 DIAGNOSIS — U071 COVID-19: Secondary | ICD-10-CM

## 2021-05-26 DIAGNOSIS — Z7901 Long term (current) use of anticoagulants: Secondary | ICD-10-CM | POA: Diagnosis not present

## 2021-05-26 MED ORDER — NIRMATRELVIR/RITONAVIR (PAXLOVID)TABLET: 3.0000 | ORAL_TABLET | Freq: Two times a day (BID) | ORAL | 0 refills | Status: DC

## 2021-05-26 MED ORDER — NIRMATRELVIR/RITONAVIR (PAXLOVID)TABLET: 3.0000 | ORAL_TABLET | Freq: Two times a day (BID) | ORAL | 0 refills | Status: AC

## 2021-05-26 NOTE — Progress Notes (Signed)
Virtual Visit via Video Note  I connected with Jeffrey Little on 05/26/21 at  3:30 PM EDT by telephone b/c a video enabled telemedicine application was attempted but too many technical difficulties arose from the patient's end.  I verified that I am speaking with the correct person using two identifiers.  Location patient: home, [place] Location provider:work or home office Persons participating in the virtual visit: patient, provider  I discussed the limitations of evaluation and management by telemedicine and the availability of in person appointments. The patient expressed understanding and agreed to proceed.  Telemedicine visit is a necessity given the COVID-19 restrictions in place at the current time.  HPI: 75 y/o WM being seen today for cough. Onset 3 d/a, runny/stuffy nose, coughing, feeling flushed some.  No fevers.  No SOB except hard to breath through nose. No CP or wheezing. Covid home test positive today.  ROS: See pertinent positives and negatives per HPI.  Past Medical History:  Diagnosis Date   BPH (benign prostatic hyperplasia)    Cataract    Right; impairing vision as of 05/2017. (has ophthalmologist)   Chronic renal insufficiency, stage II (mild)    CrCl in the 60s   Coronary artery disease    GERD (gastroesophageal reflux disease)    Hearing loss    In a study at [facility] as of 2019   Hx of colonic polyps    03/2017--adenomatous.  Recall 03/2020 ([facility]).   Hypercholesteremia    Pt refuses statins (as of 05/2016). Declined again 02/2019.   Hypertension    IBS (irritable bowel syndrome)    OSA on CPAP    Helene Kelp Day, NP at Neurology[facility]Sleep Medicine at [facility]   Peripheral vascular disease [facility])    Permanent atrial fibrillation ([place]) 2001   failed DCCV per pt.  Rate control + coumadin--monitored by cardiologist.   Polycythemia    Pt states he had w/u for hemachromatosis but it was NEG.  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    Prediabetes 06/22/2015   123.  A1c was 5.9%.  Stable at 6.0% 05/2016.  A1c 6.0% 11/2017. 5.9% 08/2019.   Prostate cancer screening    Pt declines any further prostate ca screening as of 05/2015.   Reactive airway disease    Scoliosis    Testosterone deficiency    Tick bite    tick removed from scrotum 02/08/20 (Deer tick per urgent care provider)    Past Surgical History:  Procedure Laterality Date   aortic ultrasound  09/2019   AAA screening; prox aorta 3cm->ULN, no aneurism.   CARDIAC CATHETERIZATION  approx 2001   nonobstruc CAD   CIRCUMCISION     COLONOSCOPY W/ POLYPECTOMY  05/12/11; 04/27/17   2018 adenomatous polyp; repeat 3 yrs (03/2020)   NASAL POLYP SURGERY     SKIN CANCER EXCISION     Non-malanoma   TRANSTHORACIC ECHOCARDIOGRAM  10/2019   EF 55-60%, nl LV syst fxn, severe LA and RA dilation, valves fine.   [place]  03/2013   14/4, EEP=11     Current Outpatient Medications:    bisoprolol-hydrochlorothiazide (ZIAC) 5-6.25 MG tablet, TAKE ONE TABLET BY MOUTH EVERY DAY, Disp: 90 tablet, Rfl: 0   Calcium Carbonate-Vitamin D 600-200 MG-UNIT TABS, Take 1 tablet by mouth 2 (two) times daily., Disp: , Rfl:    diltiazem (CARDIZEM CD) 180 MG 24 hr capsule, Take 1 capsule (180 mg total) by mouth 2 (two) times daily., Disp:  180 capsule, Rfl: 3   Glucosamine-Chondroit-Vit C-Mn (GLUCOSAMINE-CHONDROITIN MAX ST PO), Take 2 capsules by mouth daily. , Disp: , Rfl:    losartan (COZAAR) 50 MG tablet, Take 1 tablet (50 mg total) by mouth daily., Disp: 90 tablet, Rfl: 3   Multiple Vitamins-Minerals (MENS MULTIVITAMIN PLUS PO), Take 1 tablet by mouth daily., Disp: , Rfl:    pantoprazole (PROTONIX) 40 MG tablet, Take 1 tablet (40 mg total) by mouth daily., Disp: 90 tablet, Rfl: 3   Testosterone 12.5 MG/ACT (1%) GEL, 3 pumps onto skin three days per week, Disp: 150 g, Rfl: 5   warfarin (COUMADIN) 2.5 MG tablet, Take by mouth., Disp: , Rfl:   EXAM:  VITALS per patient if  applicable:  Vitals with BMI 01/19/2021 01/12/2021 04/09/2020  Height 5\' 5"  5\' 5"  -  Weight 210 lbs 210 lbs 3 oz 205 lbs 3 oz  BMI 29.93 71.69 -  Systolic - 678 938  Diastolic - 72 94  Pulse - 54 61    General: sounds well except nasal congestion.  Lucid thought and speech. No further exam b/c audio visit only.  LABS: none today    Chemistry      Component Value Date/Time   NA 141 01/12/2021 1327   K 4.5 01/12/2021 1327   CL 103 01/12/2021 1327   CO2 30 01/12/2021 1327   BUN 20 01/12/2021 1327   CREATININE 1.15 01/12/2021 1327      Component Value Date/Time   CALCIUM 9.5 01/12/2021 1327   ALKPHOS 56 09/19/2019 0820   AST 17 09/19/2019 0820   ALT 19 09/19/2019 0820   BILITOT 0.9 09/19/2019 0820     Lab Results  Component Value Date   HGBA1C 6.2 01/12/2021    ASSESSMENT AND PLAN:  Discussed the following assessment and plan:  Covid 19 respiratory infection, mild clinical illness. Pt with multiple RF's for complication/severe dz from covid. GFR 62. Will rx paxlovid. Cont symptomatic care with mucinex dm and saline nasal spray and tylenol. Signs/symptoms to call or return for were reviewed and pt expressed understanding.  I discussed the assessment and treatment plan with the patient. The patient was provided an opportunity to ask questions and all were answered. The patient agreed with the plan and demonstrated an understanding of the instructions.   Spent 12 min with pt today reviewing HPI, reviewing relevant past history, doing exam, reviewing and discussing lab and imaging data, and formulating plans.  F/u: if not improving in 2-3d  Signed:  Crissie Sickles, MD           05/26/2021

## 2021-06-13 DIAGNOSIS — K5730 Diverticulosis of large intestine without perforation or abscess without bleeding: Secondary | ICD-10-CM | POA: Diagnosis not present

## 2021-06-13 DIAGNOSIS — D125 Benign neoplasm of sigmoid colon: Secondary | ICD-10-CM | POA: Diagnosis not present

## 2021-06-13 DIAGNOSIS — Z86010 Personal history of colonic polyps: Secondary | ICD-10-CM | POA: Diagnosis not present

## 2021-06-13 DIAGNOSIS — D124 Benign neoplasm of descending colon: Secondary | ICD-10-CM | POA: Diagnosis not present

## 2021-06-13 DIAGNOSIS — Z1211 Encounter for screening for malignant neoplasm of colon: Secondary | ICD-10-CM | POA: Diagnosis not present

## 2021-06-13 DIAGNOSIS — K635 Polyp of colon: Secondary | ICD-10-CM | POA: Diagnosis not present

## 2021-06-13 LAB — HM COLONOSCOPY

## 2021-06-20 ENCOUNTER — Encounter: Payer: Self-pay | Admitting: Family Medicine

## 2021-07-05 ENCOUNTER — Other Ambulatory Visit: Payer: Self-pay | Admitting: Family Medicine

## 2021-07-11 ENCOUNTER — Other Ambulatory Visit: Payer: Self-pay | Admitting: Family Medicine

## 2021-07-12 DIAGNOSIS — Z7901 Long term (current) use of anticoagulants: Secondary | ICD-10-CM | POA: Diagnosis not present

## 2021-07-12 DIAGNOSIS — Z5181 Encounter for therapeutic drug level monitoring: Secondary | ICD-10-CM | POA: Diagnosis not present

## 2021-07-12 DIAGNOSIS — I4891 Unspecified atrial fibrillation: Secondary | ICD-10-CM | POA: Diagnosis not present

## 2021-07-28 DIAGNOSIS — M130 Polyarthritis, unspecified: Secondary | ICD-10-CM

## 2021-07-28 HISTORY — DX: Polyarthritis, unspecified: M13.0

## 2021-07-29 DIAGNOSIS — L570 Actinic keratosis: Secondary | ICD-10-CM | POA: Diagnosis not present

## 2021-07-29 DIAGNOSIS — Z85828 Personal history of other malignant neoplasm of skin: Secondary | ICD-10-CM | POA: Diagnosis not present

## 2021-07-29 DIAGNOSIS — L818 Other specified disorders of pigmentation: Secondary | ICD-10-CM | POA: Diagnosis not present

## 2021-07-29 DIAGNOSIS — D229 Melanocytic nevi, unspecified: Secondary | ICD-10-CM | POA: Diagnosis not present

## 2021-08-05 DIAGNOSIS — Z961 Presence of intraocular lens: Secondary | ICD-10-CM | POA: Diagnosis not present

## 2021-08-07 DIAGNOSIS — M2550 Pain in unspecified joint: Secondary | ICD-10-CM | POA: Diagnosis not present

## 2021-08-09 ENCOUNTER — Encounter: Payer: Self-pay | Admitting: Family Medicine

## 2021-08-09 DIAGNOSIS — Z5181 Encounter for therapeutic drug level monitoring: Secondary | ICD-10-CM | POA: Diagnosis not present

## 2021-08-09 DIAGNOSIS — I482 Chronic atrial fibrillation, unspecified: Secondary | ICD-10-CM | POA: Diagnosis not present

## 2021-08-09 DIAGNOSIS — Z7901 Long term (current) use of anticoagulants: Secondary | ICD-10-CM | POA: Diagnosis not present

## 2021-08-25 ENCOUNTER — Other Ambulatory Visit: Payer: Self-pay

## 2021-08-31 ENCOUNTER — Encounter: Payer: Self-pay | Admitting: Family Medicine

## 2021-08-31 ENCOUNTER — Other Ambulatory Visit: Payer: Self-pay

## 2021-08-31 ENCOUNTER — Ambulatory Visit (INDEPENDENT_AMBULATORY_CARE_PROVIDER_SITE_OTHER): Payer: PPO | Admitting: Family Medicine

## 2021-08-31 VITALS — BP 122/74 | HR 64 | Temp 98.2°F | Wt 202.2 lb

## 2021-08-31 DIAGNOSIS — E291 Testicular hypofunction: Secondary | ICD-10-CM

## 2021-08-31 DIAGNOSIS — N183 Chronic kidney disease, stage 3 unspecified: Secondary | ICD-10-CM

## 2021-08-31 DIAGNOSIS — K219 Gastro-esophageal reflux disease without esophagitis: Secondary | ICD-10-CM

## 2021-08-31 DIAGNOSIS — R7303 Prediabetes: Secondary | ICD-10-CM | POA: Diagnosis not present

## 2021-08-31 DIAGNOSIS — I10 Essential (primary) hypertension: Secondary | ICD-10-CM | POA: Diagnosis not present

## 2021-08-31 DIAGNOSIS — M19049 Primary osteoarthritis, unspecified hand: Secondary | ICD-10-CM

## 2021-08-31 LAB — CBC
HCT: 50.3 % (ref 39.0–52.0)
Hemoglobin: 17.3 g/dL — ABNORMAL HIGH (ref 13.0–17.0)
MCHC: 34.3 g/dL (ref 30.0–36.0)
MCV: 91.7 fl (ref 78.0–100.0)
Platelets: 179 10*3/uL (ref 150.0–400.0)
RBC: 5.49 Mil/uL (ref 4.22–5.81)
RDW: 14 % (ref 11.5–15.5)
WBC: 7.5 10*3/uL (ref 4.0–10.5)

## 2021-08-31 LAB — COMPREHENSIVE METABOLIC PANEL
ALT: 18 U/L (ref 0–53)
AST: 17 U/L (ref 0–37)
Albumin: 4.3 g/dL (ref 3.5–5.2)
Alkaline Phosphatase: 63 U/L (ref 39–117)
BUN: 19 mg/dL (ref 6–23)
CO2: 29 mEq/L (ref 19–32)
Calcium: 9.3 mg/dL (ref 8.4–10.5)
Chloride: 101 mEq/L (ref 96–112)
Creatinine, Ser: 1.21 mg/dL (ref 0.40–1.50)
GFR: 58.33 mL/min — ABNORMAL LOW (ref >60.00)
Glucose, Bld: 86 mg/dL (ref 70–99)
Potassium: 4.1 mEq/L (ref 3.5–5.1)
Sodium: 139 mEq/L (ref 135–145)
Total Bilirubin: 1 mg/dL (ref 0.2–1.2)
Total Protein: 7.2 g/dL (ref 6.0–8.3)

## 2021-08-31 LAB — LUTEINIZING HORMONE: LH: 2.59 m[IU]/mL — ABNORMAL LOW (ref 3.10–34.60)

## 2021-08-31 LAB — TESTOSTERONE: Testosterone: 323.16 ng/dL (ref 300.00–890.00)

## 2021-08-31 LAB — HEMOGLOBIN A1C: Hgb A1c MFr Bld: 6.1 % (ref 4.6–6.5)

## 2021-08-31 MED ORDER — BISOPROLOL-HYDROCHLOROTHIAZIDE 5-6.25 MG PO TABS: 1.0000 | ORAL_TABLET | Freq: Every day | ORAL | 3 refills | Status: DC

## 2021-08-31 NOTE — Progress Notes (Signed)
OFFICE VISIT  08/31/2021  CC:  Chief Complaint  Patient presents with   Follow-up    RCI; not fasting   HPI:    Patient is a 76 y.o. male who presents for f/u HTN, prediabetes, CRI II/III, and male hypogonadism. He has a-fib and is on rate control and coumadin, +hx nonobst CAD, followed by cardiology. I last saw him 01/12/21. A/P as of that visit: "1) HTN: reasonably well controlled. Cont losartan 50mg  qd, bisop-hct 5-6.25 1 bid, and cardizem cd 180mg  bid. Lytes/cr today.   2) CRI II/III: avoids NSAIDs. Lytes/cr today.   3) Prediabetes: Hba1c monitoring today (nonfasting glucose as well).   4) chronic allergic rhinitis with PND cough. Reassured.  He is willing to just live with the sx's at this point--no meds.   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 Was on 3 x/week dosing prior (gel). Restart testost. Testost level check + cbc today.   6) hx of adenomatous polyps, overdue for repeat colonoscopy-->Ref back to [place] for rpt colonoscopy.   7) Chronic fatigue: suspect multifactorial-->deconditioning/overweight, hypogonadism, a-fib. No new w/u at this time."  INTERIM HX: Jeffrey Little comes in without acute complaint. Has been taking chronic medications as instructed.  Home blood pressures consistently normal. He applies his testosterone gel 3 days a week.  He feels like this has helped some with his energy level overall. He has some chronic mild generalized fatigue.  Has been having some bad reflux episodes and these have led to some cough as well as a period of extreme difficulty swallowing.  After that 1 incidence of this difficulty swallowing he has not had any more trouble with dysphagia.  He takes his PPI once a day. He has arranged an appointment with digestive health specialist for further evaluation of this problem.  Most recent colonoscopy was 06/13/21, had one adenomatous polyp, was told rpt needed in 3 yrs (Dr. Willadean Carol, [facility]).  Last month he had acute episode of swelling and some knuckles of his hands.  He went to urgent care.  I have reviewed this encounter data.  It was felt he might have gout so he was started on colchicine.  Some labs were done but I have no results available for review today. He tolerated only a couple doses of the colchicine due to diarrhea.  He notes that all the swelling and acute pain of his joints has resolved. He does have some baseline bilateral hand stiffness present for years. Denies any known history of gout flares.  ROS as above, plus--> no fevers, no CP, no SOB, no wheezing, no cough, no dizziness, no HAs, no rashes, no melena/hematochezia.  No polyuria or polydipsia.  No myalgias.  No focal weakness, paresthesias, or tremors.  No acute vision or hearing abnormalities.  No dysuria or unusual/new urinary urgency or frequency.  No recent changes in lower legs. No n/v/d or abd pain.  No palpitations.    Past Medical History:  Diagnosis Date   BPH (benign prostatic hyperplasia)    Cataract    Right; impairing vision as of 05/2017. (has ophthalmologist)   Chronic renal insufficiency, stage II (mild)    CrCl in the 60s   Coronary artery disease    GERD (gastroesophageal reflux disease)    Hearing loss    In a study at [facility] as of 2019   Hx of colonic polyps    03/2017 and 05/2021--adenomatous.  Recall 3 yrs. ([facility])  Hypercholesteremia    Pt refuses statins (as of 05/2016). Declined again 02/2019.   Hypertension    IBS (irritable bowel syndrome)    OSA on CPAP    Helene Kelp Day, NP at Neurology[facility]Sleep Medicine at [facility]   Peripheral vascular disease [facility])    Permanent atrial fibrillation ([place]) 2001   failed DCCV per pt.  Rate control + coumadin--monitored by cardiologist.   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  Colchicine started by UC   Polycythemia    Pt states he had w/u for hemachromatosis but it was NEG.  As of  05/2016, his polycythemia is presumably due to testosterone effect.   Prediabetes 06/22/2015   123.  A1c was 5.9%.  Stable at 6.0% 05/2016.  A1c 6.0% 11/2017. 5.9% 08/2019.   Prostate cancer screening    Pt declines any further prostate ca screening as of 05/2015.   Reactive airway disease    Scoliosis    Testosterone deficiency    Tick bite    tick removed from scrotum 02/08/20 (Deer tick per urgent care provider)    Past Surgical History:  Procedure Laterality Date   aortic ultrasound  09/2019   AAA screening; prox aorta 3cm->ULN, no aneurism.   CARDIAC CATHETERIZATION  approx 2001   nonobstruc CAD   CIRCUMCISION     COLONOSCOPY W/ POLYPECTOMY  05/12/11; 04/27/17   2018 and 05/2021--adenomatous polyp; repeat 3 yrs   NASAL POLYP SURGERY     SKIN CANCER EXCISION     Non-malanoma   TRANSTHORACIC ECHOCARDIOGRAM  10/2019   EF 55-60%, nl LV syst fxn, severe LA and RA dilation, valves fine.   [place]  03/2013   14/4, EEP=11    Outpatient Medications Prior to Visit  Medication Sig Dispense Refill   Calcium Carbonate-Vitamin D 600-200 MG-UNIT TABS Take 1 tablet by mouth 2 (two) times daily.     diltiazem (CARDIZEM CD) 180 MG 24 hr capsule Take 1 capsule (180 mg total) by mouth 2 (two) times daily. 180 capsule 3   Glucosamine-Chondroit-Vit C-Mn (GLUCOSAMINE-CHONDROITIN MAX ST PO) Take 2 capsules by mouth daily.      losartan (COZAAR) 50 MG tablet Take 1 tablet (50 mg total) by mouth daily. 90 tablet 3   Multiple Vitamins-Minerals (MENS MULTIVITAMIN PLUS PO) Take 1 tablet by mouth daily.     pantoprazole (PROTONIX) 40 MG tablet Take 1 tablet (40 mg total) by mouth daily. 90 tablet 3   Testosterone 12.5 MG/ACT (1%) GEL 3 pumps onto skin three days per week 150 g 5   warfarin (COUMADIN) 2.5 MG tablet Take by mouth.     bisoprolol-hydrochlorothiazide (ZIAC) 5-6.25 MG tablet TAKE ONE TABLET BY MOUTH EVERY DAY 30 tablet 0   colchicine 0.6 MG tablet 1.2 mg (two 0.6-mg tablets)  orally once followed by 0.6 mg (1 tablet) one hour later; MAX 1.8 mg over 1 hour. MAX 3mg  per 24hrs (Patient not taking: Reported on 08/31/2021)     No facility-administered medications prior to visit.    Allergies  Allergen Reactions   Lac Bovis Other (See Comments)    Causes reflux if consumed in the pm.   Atorvastatin     REACTION: INTOL to Lipitor   Pollen Extract Other (See Comments)    Stuffy nose    ROS As per HPI  PE: Vitals with BMI 08/31/2021 05/26/2021 01/19/2021  Height - - 5\' 5"   Weight 202 lbs  3 oz 208 lbs 210 lbs  BMI - - 95.09  Systolic 326 712 -  Diastolic 74 66 -  Pulse 64 - -     Physical Exam  Gen: Alert, well appearing.  Patient is oriented to person, place, time, and situation. AFFECT: pleasant, lucid thought and speech. CV: irreg irreg, no m/r/g.   LUNGS: CTA bilat, nonlabored resps, good aeration in all lung fields. Hands: He has some mild bony hypertrophy of the IP joints, less so in MCPs.  No erythema, soft tissue swelling, or tenderness.  Hands are somewhat stiff but range of motion intact.  LABS:  Last CBC Lab Results  Component Value Date   WBC 7.3 01/12/2021   HGB 17.3 (H) 01/12/2021   HCT 50.6 01/12/2021   MCV 91.7 01/12/2021   MCH 31.6 12/07/2015   RDW 13.7 01/12/2021   PLT 182.0 01/12/2021   Lab Results  Component Value Date   IRON 132 09/15/2011   Lab Results  Component Value Date   TESTOSTERONE 221.58 (L) 45/80/9983   Last metabolic panel Lab Results  Component Value Date   GLUCOSE 93 01/12/2021   NA 141 01/12/2021   K 4.5 01/12/2021   CL 103 01/12/2021   CO2 30 01/12/2021   BUN 20 01/12/2021   CREATININE 1.15 01/12/2021   CALCIUM 9.5 01/12/2021   PROT 6.9 09/19/2019   ALBUMIN 4.4 09/19/2019   BILITOT 0.9 09/19/2019   ALKPHOS 56 09/19/2019   AST 17 09/19/2019   ALT 19 09/19/2019   Last lipids Lab Results  Component Value Date   CHOL 208 (H) 09/19/2019   HDL 38.30 (L) 09/19/2019   LDLCALC 108 (H) 06/24/2018    LDLDIRECT 129.0 09/19/2019   TRIG 217.0 (H) 09/19/2019   CHOLHDL 5 09/19/2019   Last hemoglobin A1c Lab Results  Component Value Date   HGBA1C 6.2 01/12/2021   Last thyroid functions Lab Results  Component Value Date   TSH 0.60 12/01/2013   IMPRESSION AND PLAN:  #1 hypertension, well controlled.  Continue losartan 50 mg a day and bisoprolol-HCTZ 5/6.25 daily. Electrolytes and creatinine today.  2.  Prediabetes. Hemoglobin A1c today.  3.  Male hypogonadism.  Seems to get a little bit of improvement with use of testosterone gel 3 days a week. Testosterone level, LH, and CBC today.  #4 GERD.  Having some significant breakthrough episodes on daily PPI. He has appropriate evaluation arranged already with gastroenterologist, [facility].  #5 acute arthritis in hands.  Resolved.  Need to get labs from his urgent care visit.  Unclear whether or not the couple doses of colchicine he took had anything to do with his improvement. If this recurs I encouraged him to come in so I can look at his joints in the acute setting.  #6 A. fib, chronic.  Stable-asymptomatic.  Continue rate control with diltiazem CD1 80/day.  Continue Coumadin as per cardiology management  An After Visit Summary was printed and given to the patient.  FOLLOW UP: Return in about 6 months (around 02/28/2022) for annual CPE (fasting).  Signed:  Crissie Sickles, MD           08/31/2021

## 2021-09-13 DIAGNOSIS — Z7901 Long term (current) use of anticoagulants: Secondary | ICD-10-CM | POA: Diagnosis not present

## 2021-09-13 DIAGNOSIS — R1312 Dysphagia, oropharyngeal phase: Secondary | ICD-10-CM | POA: Diagnosis not present

## 2021-09-13 DIAGNOSIS — Z5181 Encounter for therapeutic drug level monitoring: Secondary | ICD-10-CM | POA: Diagnosis not present

## 2021-09-13 DIAGNOSIS — I482 Chronic atrial fibrillation, unspecified: Secondary | ICD-10-CM | POA: Diagnosis not present

## 2021-09-13 DIAGNOSIS — R1319 Other dysphagia: Secondary | ICD-10-CM | POA: Diagnosis not present

## 2021-10-06 DIAGNOSIS — I10 Essential (primary) hypertension: Secondary | ICD-10-CM | POA: Diagnosis not present

## 2021-10-06 DIAGNOSIS — K317 Polyp of stomach and duodenum: Secondary | ICD-10-CM | POA: Diagnosis not present

## 2021-10-06 DIAGNOSIS — Z87891 Personal history of nicotine dependence: Secondary | ICD-10-CM | POA: Diagnosis not present

## 2021-10-06 DIAGNOSIS — E669 Obesity, unspecified: Secondary | ICD-10-CM | POA: Diagnosis not present

## 2021-10-06 DIAGNOSIS — R1319 Other dysphagia: Secondary | ICD-10-CM | POA: Diagnosis not present

## 2021-10-06 DIAGNOSIS — E785 Hyperlipidemia, unspecified: Secondary | ICD-10-CM | POA: Diagnosis not present

## 2021-10-06 DIAGNOSIS — I2510 Atherosclerotic heart disease of native coronary artery without angina pectoris: Secondary | ICD-10-CM | POA: Diagnosis not present

## 2021-10-06 DIAGNOSIS — Z6834 Body mass index (BMI) 34.0-34.9, adult: Secondary | ICD-10-CM | POA: Diagnosis not present

## 2021-10-06 DIAGNOSIS — I482 Chronic atrial fibrillation, unspecified: Secondary | ICD-10-CM | POA: Diagnosis not present

## 2021-10-06 DIAGNOSIS — G4733 Obstructive sleep apnea (adult) (pediatric): Secondary | ICD-10-CM | POA: Diagnosis not present

## 2021-10-06 DIAGNOSIS — Z7901 Long term (current) use of anticoagulants: Secondary | ICD-10-CM | POA: Diagnosis not present

## 2021-10-07 HISTORY — PX: UPPER GI ENDOSCOPY: SHX6162

## 2021-10-13 ENCOUNTER — Other Ambulatory Visit: Payer: Self-pay

## 2021-10-14 ENCOUNTER — Ambulatory Visit (INDEPENDENT_AMBULATORY_CARE_PROVIDER_SITE_OTHER): Payer: PPO | Admitting: Family Medicine

## 2021-10-14 ENCOUNTER — Other Ambulatory Visit: Payer: Self-pay

## 2021-10-14 ENCOUNTER — Encounter: Payer: Self-pay | Admitting: Family Medicine

## 2021-10-14 VITALS — BP 122/67 | HR 73 | Temp 98.1°F | Ht 65.0 in | Wt 209.0 lb

## 2021-10-14 DIAGNOSIS — R062 Wheezing: Secondary | ICD-10-CM

## 2021-10-14 DIAGNOSIS — J209 Acute bronchitis, unspecified: Secondary | ICD-10-CM | POA: Diagnosis not present

## 2021-10-14 DIAGNOSIS — J069 Acute upper respiratory infection, unspecified: Secondary | ICD-10-CM

## 2021-10-14 MED ORDER — PREDNISONE 20 MG PO TABS: ORAL_TABLET | ORAL | 0 refills | Status: DC

## 2021-10-14 MED ORDER — ALBUTEROL SULFATE HFA 108 (90 BASE) MCG/ACT IN AERS: 2.0000 | INHALATION_SPRAY | RESPIRATORY_TRACT | 0 refills | Status: DC | PRN

## 2021-10-14 MED ORDER — TETANUS-DIPHTH-ACELL PERTUSSIS 5-2-15.5 LF-MCG/0.5 IM SUSP: 0.5000 mL | Freq: Once | INTRAMUSCULAR | 0 refills | Status: AC

## 2021-10-14 NOTE — Addendum Note (Signed)
Addended by: Deveron Furlong D on: 10/14/2021 11:50 AM   Modules accepted: Orders

## 2021-10-14 NOTE — Progress Notes (Signed)
OFFICE VISIT  10/14/2021  CC:  Chief Complaint  Patient presents with   Cough    Pt also c/o wheezing since yesterday. Cough is productive with yellowish orange phlegm and deep. Has been using plain Mucinex  bid for 2 days. Had endoscopy yesterday and med changed from Pantoprazole to Aciphex. Advised may need to see pulmonologist . No referral placed but pt scheduled for 3/2 with [facility].    Patient is a 76 y.o. male who presents for cough and wheeze.  HPI 5d hx nasal cong, runny nose, cough that is getting productive, noted wheezing onset couple days ago.  No fever, HA, ST, or body aches.  Coughing is impairing sleep and the use of his cpap.  Pt reports he typically gets a case of bronchitis 2 x/year. No hx smoking.  Past Medical History:  Diagnosis Date   BPH (benign prostatic hyperplasia)    Cataract    Right; impairing vision as of 05/2017. (has ophthalmologist)   Chronic renal insufficiency, stage II (mild)    CrCl in the 60s   Coronary artery disease    GERD (gastroesophageal reflux disease)    Hearing loss    In a study at [facility] as of 2019   Hx of colonic polyps    03/2017 and 05/2021--adenomatous.  Recall 3 yrs. ([facility])   Hypercholesteremia    Pt refuses statins (as of 05/2016). Declined again 02/2019.   Hypertension    IBS (irritable bowel syndrome)    OSA on CPAP    Helene Kelp Day, NP at Neurology-Sleep Medicine at [facility]   Peripheral vascular disease [facility])    Permanent atrial fibrillation ([place]) 2001   failed DCCV per pt.  Rate control + coumadin--monitored by cardiologist.   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  Colchicine started by UC   Polycythemia    Pt states he had w/u for hemachromatosis but it was NEG.  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   Prediabetes 06/22/2015   123.  A1c was 5.9%.  Stable at 6.0% 05/2016.  A1c 6.0% 11/2017. 5.9% 08/2019.   Prostate cancer screening     Pt declines any further prostate ca screening as of 05/2015.   Reactive airway disease    Scoliosis    Testosterone deficiency    Tick bite    tick removed from scrotum 02/08/20 (Deer tick per urgent care provider)    Past Surgical History:  Procedure Laterality Date   aortic ultrasound  09/2019   AAA screening; prox aorta 3cm->ULN, no aneurism.   CARDIAC CATHETERIZATION  approx 2001   nonobstruc CAD   CIRCUMCISION     COLONOSCOPY W/ POLYPECTOMY  05/12/11; 04/27/17   2018 and 05/2021--adenomatous polyp; repeat 3 yrs   NASAL POLYP SURGERY     SKIN CANCER EXCISION     Non-malanoma   TRANSTHORACIC ECHOCARDIOGRAM  10/2019   EF 55-60%, nl LV syst fxn, severe LA and RA dilation, valves fine.   UPPER GI ENDOSCOPY  10/07/2021   VASECTOMY  1983   VPAP  03/2013   14/4, EEP=11    Outpatient Medications Prior to Visit  Medication Sig Dispense Refill   bisoprolol-hydrochlorothiazide (ZIAC) 5-6.25 MG tablet Take 1 tablet by mouth daily. 90 tablet 3   Calcium Carbonate-Vitamin D 600-200 MG-UNIT TABS Take 1 tablet by mouth 2 (two) times daily.     colchicine 0.6 MG tablet  diltiazem (CARDIZEM CD) 180 MG 24 hr capsule Take 1 capsule (180 mg total) by mouth 2 (two) times daily. 180 capsule 3   Glucosamine-Chondroit-Vit C-Mn (GLUCOSAMINE-CHONDROITIN MAX ST PO) Take 2 capsules by mouth daily.      losartan (COZAAR) 50 MG tablet Take 1 tablet (50 mg total) by mouth daily. 90 tablet 3   Multiple Vitamins-Minerals (MENS MULTIVITAMIN PLUS PO) Take 1 tablet by mouth daily.     RABEprazole (ACIPHEX) 20 MG tablet Take by mouth.     Testosterone 12.5 MG/ACT (1%) GEL 3 pumps onto skin three days per week 150 g 5   warfarin (COUMADIN) 2.5 MG tablet Take by mouth.     pantoprazole (PROTONIX) 40 MG tablet Take 1 tablet (40 mg total) by mouth daily. (Patient not taking: Reported on 10/14/2021) 90 tablet 3   No facility-administered medications prior to visit.    Allergies  Allergen Reactions   Lac  Bovis Other (See Comments)    Causes reflux if consumed in the pm.   Atorvastatin     REACTION: INTOL to Lipitor   Pollen Extract Other (See Comments)    Stuffy nose    ROS As per HPI  PE: Vitals with BMI 10/14/2021 08/31/2021 05/26/2021  Height 5\' 5"  - -  Weight 209 lbs 202 lbs 3 oz 208 lbs  BMI 83.38 - -  Systolic 250 539 767  Diastolic 67 74 66  Pulse 73 64 -  02 sat today 96%   Physical Exam  VS: noted--normal. Gen: alert, NAD, NONTOXIC APPEARING. HEENT: eyes without injection, drainage, or swelling.  Ears: EACs clear, TMs with normal light reflex and landmarks.  Nose: Clear rhinorrhea, with some dried, crusty exudate adherent to mildly injected mucosa.  No purulent d/c.  No paranasal sinus TTP.  No facial swelling.  Throat and mouth without focal lesion.  No pharyngial swelling, erythema, or exudate.   Neck: supple, no LAD.   LUNGS: CTA bilat, nonlabored resps.   CV: Irreg irreg, no m/r/g. EXT: no c/c/e SKIN: no rash   LABS:  Last CBC Lab Results  Component Value Date   WBC 7.5 08/31/2021   HGB 17.3 (H) 08/31/2021   HCT 50.3 08/31/2021   MCV 91.7 08/31/2021   MCH 31.6 12/07/2015   RDW 14.0 08/31/2021   PLT 179.0 34/19/3790   Last metabolic panel Lab Results  Component Value Date   GLUCOSE 86 08/31/2021   NA 139 08/31/2021   K 4.1 08/31/2021   CL 101 08/31/2021   CO2 29 08/31/2021   BUN 19 08/31/2021   CREATININE 1.21 08/31/2021   CALCIUM 9.3 08/31/2021   PROT 7.2 08/31/2021   ALBUMIN 4.3 08/31/2021   BILITOT 1.0 08/31/2021   ALKPHOS 63 08/31/2021   AST 17 08/31/2021   ALT 18 08/31/2021   Last hemoglobin A1c Lab Results  Component Value Date   HGBA1C 6.1 08/31/2021   Lab Results  Component Value Date   TESTOSTERONE 323.16 08/31/2021   Lab Results  Component Value Date   LABURIC 8.3 (H) 07/13/2009   IMPRESSION AND PLAN:  Acute bronchitis, suspect viral. Wheezing per pt report but none on exam. Will treat with prednisone 40mg  qd x 5d and  albuterol 2p q4h prn. Continue otc mucinex.  An After Visit Summary was printed and given to the patient.  FOLLOW UP: Return if symptoms worsen or fail to improve.  Signed:  Crissie Sickles, MD           10/14/2021

## 2021-10-27 DIAGNOSIS — G4733 Obstructive sleep apnea (adult) (pediatric): Secondary | ICD-10-CM | POA: Diagnosis not present

## 2021-10-27 DIAGNOSIS — Z7901 Long term (current) use of anticoagulants: Secondary | ICD-10-CM | POA: Diagnosis not present

## 2021-10-27 DIAGNOSIS — Z79899 Other long term (current) drug therapy: Secondary | ICD-10-CM | POA: Diagnosis not present

## 2021-10-27 DIAGNOSIS — R053 Chronic cough: Secondary | ICD-10-CM | POA: Diagnosis not present

## 2021-10-27 DIAGNOSIS — I482 Chronic atrial fibrillation, unspecified: Secondary | ICD-10-CM | POA: Diagnosis not present

## 2021-10-27 DIAGNOSIS — G4731 Primary central sleep apnea: Secondary | ICD-10-CM | POA: Diagnosis not present

## 2021-10-27 DIAGNOSIS — Z5181 Encounter for therapeutic drug level monitoring: Secondary | ICD-10-CM | POA: Diagnosis not present

## 2021-11-21 DIAGNOSIS — I482 Chronic atrial fibrillation, unspecified: Secondary | ICD-10-CM | POA: Diagnosis not present

## 2021-11-21 DIAGNOSIS — Z5181 Encounter for therapeutic drug level monitoring: Secondary | ICD-10-CM | POA: Diagnosis not present

## 2021-11-21 DIAGNOSIS — Z7901 Long term (current) use of anticoagulants: Secondary | ICD-10-CM | POA: Diagnosis not present

## 2021-11-30 DIAGNOSIS — R053 Chronic cough: Secondary | ICD-10-CM | POA: Diagnosis not present

## 2021-12-02 DIAGNOSIS — J984 Other disorders of lung: Secondary | ICD-10-CM | POA: Diagnosis not present

## 2021-12-08 ENCOUNTER — Other Ambulatory Visit: Payer: Self-pay | Admitting: Family Medicine

## 2021-12-08 NOTE — Telephone Encounter (Signed)
Requesting: testosterone ?Contract: 01/12/21 ?UDS: N/A ?Last Visit: 10/14/21 ?Next Visit:03/02/22 ?Last Refill: 01/12/21(150g,5) ? ?Please Advise. Med pending  ?

## 2021-12-12 ENCOUNTER — Telehealth: Payer: Self-pay | Admitting: Family Medicine

## 2021-12-12 NOTE — Telephone Encounter (Signed)
Pt called stating the pharmacy sent request to the office for prior auth for Testosterone 12.5 MG/ACT (1%) GEL . ? ?Pt asking for [place] to f/u on prior auth & notify when approved. ? ?Call back # [phone] ?

## 2021-12-12 NOTE — Telephone Encounter (Signed)
[facility] [place] Key: [id number] - Rx #: [id number] ?Outcome: Additional Information Required ?Prior Authorization duplicate/in progress ? ?Will have to follow up with pharmacy ?

## 2021-12-13 NOTE — Telephone Encounter (Signed)
Pt called stating [employer] adv denied his prior auth and to call MD office about filing an appeal.  ? ?Please call at # [phone] ?

## 2021-12-13 NOTE — Telephone Encounter (Signed)
[employer] Lennette Bihari called # [phone] about the PA for this pts medication. Please call him back.  ?

## 2021-12-13 NOTE — Telephone Encounter (Signed)
Spoke with pharmacy and PA still needed. Was on the phone with pt's insurance and provided clinical information as well as by fax regarding meds pt has tried/failed, pertinent labs and OV notes. Pt was advised it would take 7 business days for appeal decision. He voiced understanding ?

## 2021-12-14 NOTE — Telephone Encounter (Signed)
LM for pt to return call regarding PA update. See message below ? ?

## 2021-12-14 NOTE — Telephone Encounter (Signed)
LM for pt regarding medication. ? ?PA update, medication approved as of 12/14/21-12/13/22. Continued coverage will have to be submitted again 30 days before 12/13/22 thru Cover My Meds or faxing to [employer] at [phone].  ? ? ?

## 2021-12-15 NOTE — Telephone Encounter (Signed)
Called pharmacy to confirm if pt has picked up rx. Medication had to be ordered, scheduled to delivered to pharmacy today. Pt has $90 copay after PA with insurance. ?

## 2021-12-16 NOTE — Telephone Encounter (Signed)
Tried calling pt multiple times regarding medication with no CB. If pt returns call, please advise of message below. ?

## 2021-12-26 ENCOUNTER — Other Ambulatory Visit: Payer: Self-pay | Admitting: Family Medicine

## 2021-12-30 DIAGNOSIS — M4120 Other idiopathic scoliosis, site unspecified: Secondary | ICD-10-CM | POA: Diagnosis not present

## 2021-12-30 DIAGNOSIS — E669 Obesity, unspecified: Secondary | ICD-10-CM | POA: Diagnosis not present

## 2021-12-30 DIAGNOSIS — Z6835 Body mass index (BMI) 35.0-35.9, adult: Secondary | ICD-10-CM | POA: Diagnosis not present

## 2021-12-30 DIAGNOSIS — Z79899 Other long term (current) drug therapy: Secondary | ICD-10-CM | POA: Diagnosis not present

## 2021-12-30 DIAGNOSIS — R001 Bradycardia, unspecified: Secondary | ICD-10-CM | POA: Diagnosis not present

## 2021-12-30 DIAGNOSIS — Z87891 Personal history of nicotine dependence: Secondary | ICD-10-CM | POA: Diagnosis not present

## 2021-12-30 DIAGNOSIS — I10 Essential (primary) hypertension: Secondary | ICD-10-CM | POA: Diagnosis not present

## 2021-12-30 DIAGNOSIS — I482 Chronic atrial fibrillation, unspecified: Secondary | ICD-10-CM | POA: Diagnosis not present

## 2021-12-30 DIAGNOSIS — Z7901 Long term (current) use of anticoagulants: Secondary | ICD-10-CM | POA: Diagnosis not present

## 2022-01-04 ENCOUNTER — Encounter: Payer: Self-pay | Admitting: Family Medicine

## 2022-01-04 ENCOUNTER — Ambulatory Visit (INDEPENDENT_AMBULATORY_CARE_PROVIDER_SITE_OTHER): Payer: PPO | Admitting: Family Medicine

## 2022-01-04 VITALS — BP 122/69 | HR 70 | Temp 98.2°F | Ht 65.0 in | Wt 211.0 lb

## 2022-01-04 DIAGNOSIS — I4821 Permanent atrial fibrillation: Secondary | ICD-10-CM | POA: Diagnosis not present

## 2022-01-04 DIAGNOSIS — I10 Essential (primary) hypertension: Secondary | ICD-10-CM

## 2022-01-04 NOTE — Patient Instructions (Signed)
Your blood pressure is great today!!! ?Bring your blood pressure cuff with you to compare.  ?Follow up in 2 weeks if home pressures are still high, may be your cuff. ? ? ?122/69!!! ? ? ? ? ? ? ?

## 2022-01-04 NOTE — Progress Notes (Signed)
? ? ? ? ?Jeffrey Little , [birth year], 76 y.o., male ?MRN: [id number] ?Patient Care Team  ?  Relationship Specialty Notifications Start End  ?Tammi Sou, MD PCP - General Family Medicine  11/10/13   ? Comment: Dr Lenna Gilford retired  ?Aviva Signs, MD Consulting Physician Gastroenterology  05/09/17   ? Comment: [facility] [place]  ?Darreld Mclean, MD Consulting Physician Dermatology  07/17/18   ?Veneda Melter, MD Consulting Physician Cardiology  01/09/19   ? Comment: [facility] cardiac electrophysiology  ?Scherrie November, MD Consulting Physician Gastroenterology  10/14/21   ? ? ?Chief Complaint  ?Patient presents with  ? Hypertension  ? ?  ?Subjective: Pt presents for an OV with complaints of elevated blood pressure. ?Pt reports compliance with Cozaar 50 mg daily, Cardizem 180 mg daily (recently lowered for bradycardia), Ziac 5-6.25. Blood pressures ranges at home 657Q - 469G systolic. Patient denies chest pain, shortness of breath or lower extremity edema.  He reports compliance with warfarin anticoagulation. ?Patient reports blood pressures have been elevated at home and one of his other appointments since Cardizem was lowered from twice daily to daily dosing.  He denies any symptoms. ? ? ?  08/31/2021  ?  1:05 PM 01/19/2021  ?  2:20 PM 01/12/2021  ?  1:05 PM 09/19/2019  ?  8:02 AM 03/19/2019  ?  9:03 AM  ?Depression screen PHQ 2/9  ?Decreased Interest 0 0 0 0 0  ?Down, Depressed, Hopeless 0 0 0 0 0  ?PHQ - 2 Score 0 0 0 0 0  ? ? ?Allergies  ?Allergen Reactions  ? Lac Bovis Other (See Comments)  ?  Causes reflux if consumed in the pm.  ? Atorvastatin   ?  REACTION: INTOL to Lipitor  ? Pollen Extract Other (See Comments)  ?  Stuffy nose  ? ?Social History  ? ?Social History Narrative  ? Married, 2 sons, 4 grandchildren.  ? Orig from [place].  ? Occup: semi-retired wharehousing at [employer]  Currently drives cars for dealerships.  ? No cigs, no alcohol, no drugs.  ? No exercise.  ? ?Past Medical History:   ?Diagnosis Date  ? BPH (benign prostatic hyperplasia)   ? Cataract   ? Right; impairing vision as of 05/2017. (has ophthalmologist)  ? Chronic renal insufficiency, stage II (mild)   ? CrCl in the 60s  ? Coronary artery disease   ? GERD (gastroesophageal reflux disease)   ? Hearing loss   ? In a study at [facility] as of 2019  ? Hx of colonic polyps   ? 03/2017 and 05/2021--adenomatous.  Recall 3 yrs. ([facility])  ? Hypercholesteremia   ? Pt refuses statins (as of 05/2016). Declined again 02/2019.  ? Hypertension   ? IBS (irritable bowel syndrome)   ? OSA on CPAP   ? Gala Murdoch, NP at Neurology[facility]Sleep Medicine at [facility]  ? Peripheral vascular disease ([place])   ? Permanent atrial fibrillation ([place]) 2001  ? failed DCCV per pt.  Rate control + coumadin--monitored by cardiologist.  ? 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 UC visit->acute, L hand 4th MCP, R 2nd MCP, R foot 1st MTP.  Colchicine started by UC  ? Polycythemia   ? Pt states he had w/u for hemachromatosis but it was NEG.  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  ? Prediabetes 06/22/2015  ? 123.  A1c was 5.9%.  Stable at 6.0% 05/2016.  A1c 6.0% 11/2017. 5.9% 08/2019.  ? Prostate  cancer screening   ? Pt declines any further prostate ca screening as of 05/2015.  ? Reactive airway disease   ? Scoliosis   ? Testosterone deficiency   ? Tick bite   ? tick removed from scrotum 02/08/20 (Deer tick per urgent care provider)  ? ?Past Surgical History:  ?Procedure Laterality Date  ? aortic ultrasound  09/2019  ? AAA screening; prox aorta 3cm->ULN, no aneurism.  ? CARDIAC CATHETERIZATION  approx 2001  ? nonobstruc CAD  ? CIRCUMCISION    ? COLONOSCOPY W/ POLYPECTOMY  05/12/11; 04/27/17  ? 2018 and 05/2021--adenomatous polyp; repeat 3 yrs  ? NASAL POLYP SURGERY    ? SKIN CANCER EXCISION    ? Non-malanoma  ? TRANSTHORACIC ECHOCARDIOGRAM  10/2019  ? EF 55-60%, nl LV syst fxn, severe LA and RA dilation, valves fine.  ? UPPER GI ENDOSCOPY  10/07/2021  ? normal except  gastric polyps->Dr. Derrill Kay.  Pt was then changed from protonix to aciphex  ? VASECTOMY  1983  ? VPAP  03/2013  ? 14/4, EEP=11  ? ?History reviewed. No pertinent family history. ?Allergies as of 01/04/2022   ? ?   Reactions  ? Lac Bovis Other (See Comments)  ? Causes reflux if consumed in the pm.  ? Atorvastatin   ? REACTION: INTOL to Lipitor  ? Pollen Extract Other (See Comments)  ? Stuffy nose  ? ?  ? ?  ?Medication List  ?  ? ?  ? Accurate as of Jan 04, 2022  4:15 PM. If you have any questions, ask your nurse or doctor.  ?  ?  ? ?  ? ?STOP taking these medications   ? ?predniSONE 20 MG tablet ?Commonly known as: DELTASONE ?Stopped by: [name], DO ?  ? ?  ? ?TAKE these medications   ? ?albuterol 108 (90 Base) MCG/ACT inhaler ?Commonly known as: VENTOLIN HFA ?Inhale 2 puffs into the lungs every 4 (four) hours as needed for wheezing or shortness of breath. ?  ?bisoprolol-hydrochlorothiazide 5-6.25 MG tablet ?Commonly known as: ZIAC ?Take 1 tablet by mouth daily. ?  ?Calcium Carbonate-Vitamin D 600-200 MG-UNIT Tabs ?Take 1 tablet by mouth 2 (two) times daily. ?  ?colchicine 0.6 MG tablet ?  ?diltiazem 180 MG 24 hr capsule ?Commonly known as: CARDIZEM CD ?Take 1 capsule (180 mg total) by mouth 2 (two) times daily. ?What changed: when to take this ?  ?GLUCOSAMINE-CHONDROITIN MAX ST PO ?Take 2 capsules by mouth daily. ?  ?losartan 50 MG tablet ?Commonly known as: COZAAR ?Take 1 tablet (50 mg total) by mouth daily. ?  ?MENS MULTIVITAMIN PLUS PO ?Take 1 tablet by mouth daily. ?  ?RABEprazole 20 MG tablet ?Commonly known as: ACIPHEX ?Take by mouth. ?  ?Testosterone 12.5 MG/ACT (1%) Gel ?apply 3 pumps onto THE SKIN three DAYS PER WEEK ?  ?warfarin 2.5 MG tablet ?Commonly known as: COUMADIN ?Take by mouth. ?  ? ?  ? ? ?All past medical history, surgical history, allergies, family history, immunizations andmedications were updated in the EMR today and reviewed under the history and medication portions of their EMR.     ? ?ROS ?Negative, with the exception of above mentioned in HPI ? ? ?Objective:  ?BP 122/69   Pulse 70   Temp 98.2 ?F (36.8 ?C) (Oral)   Ht '5\' 5"'$  (1.651 m)   Wt 211 lb (95.7 kg)   SpO2 94%   BMI 35.11 kg/m?  ?Body mass index is 35.11 kg/m?Marland Kitchen ?Physical Exam ?Vitals and  nursing note reviewed.  ?Constitutional:   ?   General: He is not in acute distress. ?   Appearance: Normal appearance. He is not ill-appearing, toxic-appearing or diaphoretic.  ?HENT:  ?   Head: Normocephalic and atraumatic.  ?Eyes:  ?   General: No scleral icterus.    ?   Right eye: No discharge.     ?   Left eye: No discharge.  ?   Extraocular Movements: Extraocular movements intact.  ?   Pupils: Pupils are equal, round, and reactive to light.  ?Cardiovascular:  ?   Rate and Rhythm: Normal rate and regular rhythm.  ?   Heart sounds: No murmur heard. ?Musculoskeletal:  ?   Right lower leg: No edema.  ?   Left lower leg: No edema.  ?Skin: ?   General: Skin is warm and dry.  ?   Coloration: Skin is not jaundiced or pale.  ?   Findings: No rash.  ?Neurological:  ?   Mental Status: He is alert and oriented to person, place, and time. Mental status is at baseline.  ?Psychiatric:     ?   Mood and Affect: Mood normal.     ?   Behavior: Behavior normal.     ?   Thought Content: Thought content normal.     ?   Judgment: Judgment normal.  ? ? ? ?No results found. ?No results found. ?No results found for this or any previous visit (from the past 24 hour(s)). ? ?Assessment/Plan: ?SIONE BAUMGARTEN is a 76 y.o. male present for OV for  ?Benign hypertension/Permanent atrial fibrillation (HCC) ?Blood pressure is normal here today. ?I encouraged them to continue monitoring at home and if still elevated would have them come in to have blood pressure recheck in approximately 2 weeks and bring their cuff with them. ?Sooner if any symptoms arise. ?If unable to get into PCP in that timeline would be happy to see him back. ? ?Reviewed expectations re: course of current  medical issues. ?Discussed self-management of symptoms. ?Outlined signs and symptoms indicating need for more acute intervention. ?Patient verbalized understanding and all questions were answered. ?Patient received an

## 2022-01-06 DIAGNOSIS — R053 Chronic cough: Secondary | ICD-10-CM | POA: Diagnosis not present

## 2022-01-08 DIAGNOSIS — F17290 Nicotine dependence, other tobacco product, uncomplicated: Secondary | ICD-10-CM | POA: Diagnosis not present

## 2022-01-08 DIAGNOSIS — R053 Chronic cough: Secondary | ICD-10-CM | POA: Diagnosis not present

## 2022-01-11 DIAGNOSIS — E669 Obesity, unspecified: Secondary | ICD-10-CM | POA: Diagnosis not present

## 2022-01-11 DIAGNOSIS — R001 Bradycardia, unspecified: Secondary | ICD-10-CM | POA: Diagnosis not present

## 2022-01-11 DIAGNOSIS — I482 Chronic atrial fibrillation, unspecified: Secondary | ICD-10-CM | POA: Diagnosis not present

## 2022-01-15 DIAGNOSIS — I4891 Unspecified atrial fibrillation: Secondary | ICD-10-CM | POA: Diagnosis not present

## 2022-01-18 ENCOUNTER — Ambulatory Visit: Payer: PPO

## 2022-01-20 ENCOUNTER — Encounter: Payer: Self-pay | Admitting: Family Medicine

## 2022-01-20 ENCOUNTER — Ambulatory Visit (INDEPENDENT_AMBULATORY_CARE_PROVIDER_SITE_OTHER): Payer: PPO | Admitting: Family Medicine

## 2022-01-20 VITALS — BP 132/90 | HR 58 | Temp 98.3°F | Ht 65.0 in | Wt 209.6 lb

## 2022-01-20 DIAGNOSIS — I10 Essential (primary) hypertension: Secondary | ICD-10-CM

## 2022-01-20 DIAGNOSIS — I4821 Permanent atrial fibrillation: Secondary | ICD-10-CM

## 2022-01-20 MED ORDER — LOSARTAN POTASSIUM 50 MG PO TABS: ORAL_TABLET | ORAL | 0 refills | Status: DC

## 2022-01-20 NOTE — Patient Instructions (Signed)
Increase your losartan to TWO '50mg'$  tabs once a day

## 2022-01-20 NOTE — Progress Notes (Signed)
OFFICE VISIT  01/20/2022  CC:  Chief Complaint  Patient presents with   Hypertension    Pt is not fasting. Brought arm BP cuff for comparison. His reading was 145/87 with pulse of 70   HPI: Patient is a 76 y.o. male with a-fib and nonobstructive CAD who presents for 2-week follow-up hypertension. He saw Dr. Raoul Pitch on 01/04/2022 for elevated blood pressures at home. He had recently had his Cardizem dose decreased by his electrophysiology MD due to bradycardia (12/30/21). They started Zio patch. Blood pressure was normal when seen here 01/04/2022 and he was instructed to continue monitoring at home, no med changes made at that time.  INTERIM HX: He states he feels fine other than not having the energy level he had when he was younger. He was not checking blood pressure at home prior to his cardiologist decreasing the diltiazem.  Reviewed home bps from the last 2 wks:  avg syst 165, avg diast 110, HR 66-83.  ROS as above, plus--> no fevers, no CP, no SOB, no wheezing, no cough, no dizziness, no HAs, no rashes, no melena/hematochezia.  No polyuria or polydipsia.  No myalgias or arthralgias.  No focal weakness, paresthesias, or tremors.  No acute vision or hearing abnormalities.  No dysuria or unusual/new urinary urgency or frequency.  No recent changes in lower legs. No n/v/d or abd pain.  No palpitations.    Past Medical History:  Diagnosis Date   BPH (benign prostatic hyperplasia)    Cataract    Right; impairing vision as of 05/2017. (has ophthalmologist)   Chronic renal insufficiency, stage II (mild)    CrCl in the 60s   Coronary artery disease    GERD (gastroesophageal reflux disease)    Hearing loss    In a study at [facility] as of 2019   Hx of colonic polyps    03/2017 and 05/2021--adenomatous.  Recall 3 yrs. ([facility])   Hypercholesteremia    Pt refuses statins (as of 05/2016). Declined again 02/2019.   Hypertension    IBS (irritable bowel syndrome)    OSA on CPAP    Helene Kelp  Day, NP at Neurology-Sleep Medicine at [facility]   Peripheral vascular disease [facility])    Permanent atrial fibrillation ([place]) 2001   failed DCCV per pt.  Rate control + coumadin--monitored by cardiologist.   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  Colchicine started by UC   Polycythemia    Pt states he had w/u for hemachromatosis but it was NEG.  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   Prediabetes 06/22/2015   123.  A1c was 5.9%.  Stable at 6.0% 05/2016.  A1c 6.0% 11/2017. 5.9% 08/2019.   Prostate cancer screening    Pt declines any further prostate ca screening as of 05/2015.   Reactive airway disease    Scoliosis    Testosterone deficiency    Tick bite    tick removed from scrotum 02/08/20 (Deer tick per urgent care provider)    Past Surgical History:  Procedure Laterality Date   aortic ultrasound  09/2019   AAA screening; prox aorta 3cm->ULN, no aneurism.   CARDIAC CATHETERIZATION  approx 2001   nonobstruc CAD   CIRCUMCISION     COLONOSCOPY W/ POLYPECTOMY  05/12/11; 04/27/17   2018 and 05/2021--adenomatous polyp; repeat 3 yrs   NASAL POLYP SURGERY     SKIN CANCER EXCISION  Non-malanoma   TRANSTHORACIC ECHOCARDIOGRAM  10/2019   EF 55-60%, nl LV syst fxn, severe LA and RA dilation, valves fine.   UPPER GI ENDOSCOPY  10/07/2021   normal except gastric polyps->Dr. Derrill Kay.  Pt was then changed from protonix to aciphex   [facility]  03/2013   14/4, EEP=11    Outpatient Medications Prior to Visit  Medication Sig Dispense Refill   albuterol (VENTOLIN HFA) 108 (90 Base) MCG/ACT inhaler Inhale 2 puffs into the lungs every 4 (four) hours as needed for wheezing or shortness of breath. 1 each 0   bisoprolol-hydrochlorothiazide (ZIAC) 5-6.25 MG tablet Take 1 tablet by mouth daily. 90 tablet 3   Calcium Carbonate-Vitamin D 600-200 MG-UNIT TABS Take 1 tablet by mouth 2 (two) times daily.     colchicine  0.6 MG tablet      diltiazem (CARDIZEM CD) 180 MG 24 hr capsule Take 1 capsule (180 mg total) by mouth 2 (two) times daily. (Patient taking differently: Take 180 mg by mouth daily.) 180 capsule 3   Glucosamine-Chondroit-Vit C-Mn (GLUCOSAMINE-CHONDROITIN MAX ST PO) Take 2 capsules by mouth daily.      Multiple Vitamins-Minerals (MENS MULTIVITAMIN PLUS PO) Take 1 tablet by mouth daily.     Testosterone 12.5 MG/ACT (1%) GEL apply 3 pumps onto THE SKIN three DAYS PER WEEK 150 g 5   warfarin (COUMADIN) 2.5 MG tablet Take by mouth.     losartan (COZAAR) 50 MG tablet Take 1 tablet (50 mg total) by mouth daily. 90 tablet 0   RABEprazole (ACIPHEX) 20 MG tablet Take 20 mg by mouth in the morning and at bedtime.     No facility-administered medications prior to visit.    Allergies  Allergen Reactions   Lac Bovis Other (See Comments)    Causes reflux if consumed in the pm.   Atorvastatin     REACTION: INTOL to Lipitor   Pollen Extract Other (See Comments)    Stuffy nose    ROS As per HPI  PE:    01/20/2022    9:49 AM 01/04/2022    3:25 PM 10/14/2021   10:59 AM  Vitals with BMI  Height '5\' 5"'$  '5\' 5"'$  '5\' 5"'$   Weight 209 lbs 10 oz 211 lbs 209 lbs  BMI 34.88 86.57 84.69  Systolic 629 528 413  Diastolic 90 69 67  Pulse 58 70 73  Pt's bp cuff here today 148/87  Physical Exam  Gen: Alert, well appearing.  Patient is oriented to person, place, time, and situation. AFFECT: pleasant, lucid thought and speech. CV: Irreg irreg, rate about 70 no m/r/g.   LUNGS: CTA bilat, nonlabored resps, good aeration in all lung fields. EXT: no clubbing or cyanosis.  no edema.   LABS:  Last CBC Lab Results  Component Value Date   WBC 7.5 08/31/2021   HGB 17.3 (H) 08/31/2021   HCT 50.3 08/31/2021   MCV 91.7 08/31/2021   MCH 31.6 12/07/2015   RDW 14.0 08/31/2021   PLT 179.0 24/40/1027   Last metabolic panel Lab Results  Component Value Date   GLUCOSE 86 08/31/2021   NA 139 08/31/2021   K 4.1  08/31/2021   CL 101 08/31/2021   CO2 29 08/31/2021   BUN 19 08/31/2021   CREATININE 1.21 08/31/2021   CALCIUM 9.3 08/31/2021   PROT 7.2 08/31/2021   ALBUMIN 4.3 08/31/2021   BILITOT 1.0 08/31/2021   ALKPHOS 63 08/31/2021   AST 17 08/31/2021   ALT  18 08/31/2021    IMPRESSION AND PLAN:  #1 uncontrolled hypertension. I do not think the decrease Cardizem dosing that his cardiologist did 5/5 is the cause of his elevated blood pressure lately. Hard to tell when this started because he was not monitoring blood pressure prior to the decrease. His home blood pressure cuff does read slightly higher than ours here today--- about 15 points systolic. Heart rate consistently higher/improved last 2 weeks. Today we chose to increase his losartan to 100 mg. No change in bisoprolol-HCTZ (5/6.25 qd), or diltiazem (180) CD qd. If further blood pressure control needed then neck step would be to add hydralazine.  #2  Permanent A-fib. Heart rate in better range since cardiologist decreased his Cardizem CD to 180 mg once a day. Continue this medication as well as Coumadin.  An After Visit Summary was printed and given to the patient.  FOLLOW UP: Return in about 1 week (around 01/27/2022) for f/u HTN/get bmet.  Signed:  Crissie Sickles, MD           01/20/2022

## 2022-01-25 ENCOUNTER — Ambulatory Visit (INDEPENDENT_AMBULATORY_CARE_PROVIDER_SITE_OTHER): Payer: PPO

## 2022-01-25 DIAGNOSIS — Z0000 Encounter for general adult medical examination without abnormal findings: Secondary | ICD-10-CM | POA: Diagnosis not present

## 2022-01-25 NOTE — Patient Instructions (Signed)
[name] , Thank you for taking time to come for your Medicare Wellness Visit. I appreciate your ongoing commitment to your health goals. Please review the following plan we discussed and let me know if I can assist you in the future.   Screening recommendations/referrals: Colonoscopy: Done 06/13/21 repeat every 3 years  Recommended yearly ophthalmology/optometry visit for glaucoma screening and checkup Recommended yearly dental visit for hygiene and checkup  Vaccinations: Influenza vaccine: Done 07/05/21 repeat every year  Pneumococcal vaccine: Up to date Tdap vaccine: Due and discussed  Shingles vaccine: Shingrix discussed. Please contact your pharmacy for coverage information.    Covid-19: Completed 2/1, 3/2, 07/30/20 & 12/08/20  Advanced directives: Please bring a copy of your health care power of attorney and living will to the office at your convenience.   Conditions/risks identified: Lose weight   Next appointment: Follow up in one year for your annual wellness visit.   Preventive Care 26 Years and Older, Male Preventive care refers to lifestyle choices and visits with your health care provider that can promote health and wellness. What does preventive care include? A yearly physical exam. This is also called an annual well check. Dental exams once or twice a year. Routine eye exams. Ask your health care provider how often you should have your eyes checked. Personal lifestyle choices, including: Daily care of your teeth and gums. Regular physical activity. Eating a healthy diet. Avoiding tobacco and drug use. Limiting alcohol use. Practicing safe sex. Taking low doses of aspirin every day. Taking vitamin and mineral supplements as recommended by your health care provider. What happens during an annual well check? The services and screenings done by your health care provider during your annual well check will depend on your age, overall health, lifestyle risk factors, and  family history of disease. Counseling  Your health care provider may ask you questions about your: Alcohol use. Tobacco use. Drug use. Emotional well-being. Home and relationship well-being. Sexual activity. Eating habits. History of falls. Memory and ability to understand (cognition). Work and work Statistician. Screening  You may have the following tests or measurements: Height, weight, and BMI. Blood pressure. Lipid and cholesterol levels. These may be checked every 5 years, or more frequently if you are over 23 years old. Skin check. Lung cancer screening. You may have this screening every year starting at age 64 if you have a 30-pack-year history of smoking and currently smoke or have quit within the past 15 years. Fecal occult blood test (FOBT) of the stool. You may have this test every year starting at age 37. Flexible sigmoidoscopy or colonoscopy. You may have a sigmoidoscopy every 5 years or a colonoscopy every 10 years starting at age 68. Prostate cancer screening. Recommendations will vary depending on your family history and other risks. Hepatitis C blood test. Hepatitis B blood test. Sexually transmitted disease (STD) testing. Diabetes screening. This is done by checking your blood sugar (glucose) after you have not eaten for a while (fasting). You may have this done every 1-3 years. Abdominal aortic aneurysm (AAA) screening. You may need this if you are a current or former smoker. Osteoporosis. You may be screened starting at age 41 if you are at high risk. Talk with your health care provider about your test results, treatment options, and if necessary, the need for more tests. Vaccines  Your health care provider may recommend certain vaccines, such as: Influenza vaccine. This is recommended every year. Tetanus, diphtheria, and acellular pertussis (Tdap, Td) vaccine. You  may need a Td booster every 10 years. Zoster vaccine. You may need this after age 8. Pneumococcal  13-valent conjugate (PCV13) vaccine. One dose is recommended after age 31. Pneumococcal polysaccharide (PPSV23) vaccine. One dose is recommended after age 23. Talk to your health care provider about which screenings and vaccines you need and how often you need them. This information is not intended to replace advice given to you by your health care provider. Make sure you discuss any questions you have with your health care provider. Document Released: 09/10/2015 Document Revised: 05/03/2016 Document Reviewed: 06/15/2015 Elsevier Interactive Patient Education  2017 [place] Prevention in the Home Falls can cause injuries. They can happen to people of all ages. There are many things you can do to make your home safe and to help prevent falls. What can I do on the outside of my home? Regularly fix the edges of walkways and driveways and fix any cracks. Remove anything that might make you trip as you walk through a door, such as a raised step or threshold. Trim any bushes or trees on the path to your home. Use bright outdoor lighting. Clear any walking paths of anything that might make someone trip, such as rocks or tools. Regularly check to see if handrails are loose or broken. Make sure that both sides of any steps have handrails. Any raised decks and porches should have guardrails on the edges. Have any leaves, snow, or ice cleared regularly. Use sand or salt on walking paths during winter. Clean up any spills in your garage right away. This includes oil or grease spills. What can I do in the bathroom? Use night lights. Install grab bars by the toilet and in the tub and shower. Do not use towel bars as grab bars. Use non-skid mats or decals in the tub or shower. If you need to sit down in the shower, use a plastic, non-slip stool. Keep the floor dry. Clean up any water that spills on the floor as soon as it happens. Remove soap buildup in the tub or shower regularly. Attach  bath mats securely with double-sided non-slip rug tape. Do not have throw rugs and other things on the floor that can make you trip. What can I do in the bedroom? Use night lights. Make sure that you have a light by your bed that is easy to reach. Do not use any sheets or blankets that are too big for your bed. They should not hang down onto the floor. Have a firm chair that has side arms. You can use this for support while you get dressed. Do not have throw rugs and other things on the floor that can make you trip. What can I do in the kitchen? Clean up any spills right away. Avoid walking on wet floors. Keep items that you use a lot in easy-to-reach places. If you need to reach something above you, use a strong step stool that has a grab bar. Keep electrical cords out of the way. Do not use floor polish or wax that makes floors slippery. If you must use wax, use non-skid floor wax. Do not have throw rugs and other things on the floor that can make you trip. What can I do with my stairs? Do not leave any items on the stairs. Make sure that there are handrails on both sides of the stairs and use them. Fix handrails that are broken or loose. Make sure that handrails are as long as the  stairways. Check any carpeting to make sure that it is firmly attached to the stairs. Fix any carpet that is loose or worn. Avoid having throw rugs at the top or bottom of the stairs. If you do have throw rugs, attach them to the floor with carpet tape. Make sure that you have a light switch at the top of the stairs and the bottom of the stairs. If you do not have them, ask someone to add them for you. What else can I do to help prevent falls? Wear shoes that: Do not have high heels. Have rubber bottoms. Are comfortable and fit you well. Are closed at the toe. Do not wear sandals. If you use a stepladder: Make sure that it is fully opened. Do not climb a closed stepladder. Make sure that both sides of the  stepladder are locked into place. Ask someone to hold it for you, if possible. Clearly mark and make sure that you can see: Any grab bars or handrails. First and last steps. Where the edge of each step is. Use tools that help you move around (mobility aids) if they are needed. These include: Canes. Walkers. Scooters. Crutches. Turn on the lights when you go into a dark area. Replace any light bulbs as soon as they burn out. Set up your furniture so you have a clear path. Avoid moving your furniture around. If any of your floors are uneven, fix them. If there are any pets around you, be aware of where they are. Review your medicines with your doctor. Some medicines can make you feel dizzy. This can increase your chance of falling. Ask your doctor what other things that you can do to help prevent falls. This information is not intended to replace advice given to you by your health care provider. Make sure you discuss any questions you have with your health care provider. Document Released: 06/10/2009 Document Revised: 01/20/2016 Document Reviewed: 09/18/2014 Elsevier Interactive Patient Education  2017 [employer].

## 2022-01-25 NOTE — Progress Notes (Signed)
Virtual Visit via Telephone Note  I connected with  Jeffrey Little on 01/25/22 at  8:00 AM EDT by telephone and verified that I am speaking with the correct person using two identifiers.  Medicare Annual Wellness visit completed telephonically due to Covid-19 pandemic.   Persons participating in this call: This Health Coach and this patient.   Location: Patient: Home Provider: Office   I discussed the limitations, risks, security and privacy concerns of performing an evaluation and management service by telephone and the availability of in person appointments. The patient expressed understanding and agreed to proceed.  Unable to perform video visit due to video visit attempted and failed and/or patient does not have video capability.   Some vital signs may be absent or patient reported.   Willette Brace, LPN   Subjective:   Jeffrey Little is a 76 y.o. male who presents for Medicare Annual/Subsequent preventive examination.  Review of Systems     Cardiac Risk Factors include: advanced age (>56mn, >>68women);male gender;dyslipidemia;hypertension;obesity (BMI >30kg/m2)     Objective:    There were no vitals filed for this visit. There is no height or weight on file to calculate BMI.     01/25/2022    8:09 AM  Advanced Directives  Does Patient Have a Medical Advance Directive? Yes  Type of Advance Directive HNeopitin Chart? No - copy requested    Current Medications (verified) Outpatient Encounter Medications as of 01/25/2022  Medication Sig   albuterol (VENTOLIN HFA) 108 (90 Base) MCG/ACT inhaler Inhale 2 puffs into the lungs every 4 (four) hours as needed for wheezing or shortness of breath.   bisoprolol-hydrochlorothiazide (ZIAC) 5-6.25 MG tablet Take 1 tablet by mouth daily.   Calcium Carbonate-Vitamin D 600-200 MG-UNIT TABS Take 1 tablet by mouth 2 (two) times daily.   colchicine 0.6 MG tablet     diltiazem (CARDIZEM CD) 180 MG 24 hr capsule Take 1 capsule (180 mg total) by mouth 2 (two) times daily. (Patient taking differently: Take 180 mg by mouth daily.)   Glucosamine-Chondroit-Vit C-Mn (GLUCOSAMINE-CHONDROITIN MAX ST PO) Take 2 capsules by mouth daily.    losartan (COZAAR) 50 MG tablet 2 tabs qd   Multiple Vitamins-Minerals (MENS MULTIVITAMIN PLUS PO) Take 1 tablet by mouth daily.   Testosterone 12.5 MG/ACT (1%) GEL apply 3 pumps onto THE SKIN three DAYS PER WEEK   warfarin (COUMADIN) 2.5 MG tablet Take by mouth.   RABEprazole (ACIPHEX) 20 MG tablet Take 20 mg by mouth in the morning and at bedtime.   No facility-administered encounter medications on file as of 01/25/2022.    Allergies (verified) Lac bovis, Atorvastatin, and Pollen extract   History: Past Medical History:  Diagnosis Date   BPH (benign prostatic hyperplasia)    Cataract    Right; impairing vision as of 05/2017. (has ophthalmologist)   Chronic renal insufficiency, stage II (mild)    CrCl in the 60s   Coronary artery disease    GERD (gastroesophageal reflux disease)    H[facility] loss    [facility]   Hx of colonic polyps    03/2017 and 05/2021--adenomatous.  Recall 3 yrs. (Dig [facility] spec)   Hypercholesteremia    Pt refuses statins (as of 05/2016). Declined again 02/2019.   Hypertension    IBS (irritable bowel syndrome)    OSA on CPAP    THelene KelpDay, NP at Neurology-Sleep Medicine at  [facility]   Peripheral vascular disease ([place])    Permanent atrial fibrillation ([place]) 2001   failed DCCV per pt.  Rate control + coumadin--monitored by cardiologist.   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  Colchicine started by UC   Polycythemia    Pt states he had w/u for hemachromatosis but it was NEG.  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   Prediabetes 06/22/2015   123.  A1c was 5.9%.  Stable at 6.0% 05/2016.  A1c 6.0% 11/2017. 5.9%  08/2019.   Prostate cancer screening    Pt declines any further prostate ca screening as of 05/2015.   Reactive airway disease    Scoliosis    Testosterone deficiency    Tick bite    tick removed from scrotum 02/08/20 ([place]er tick per urgent care provider)   Past Surgical History:  Procedure Laterality Date   aortic ultrasound  09/2019   AAA screening; prox aorta 3cm->ULN, no aneurism.   CARDIAC CATHETERIZATION  approx 2001   nonobstruc CAD   CIRCUMCISION     COLONOSCOPY W/ POLYPECTOMY  05/12/11; 04/27/17   2018 and 05/2021--adenomatous polyp; repeat 3 yrs   NASAL POLYP SURGERY     SKIN CANCER EXCISION     Non-malanoma   TRANSTHORACIC ECHOCARDIOGRAM  10/2019   EF 55-60%, nl LV syst fxn, severe LA and RA dilation, valves fine.   UPPER GI ENDOSCOPY  10/07/2021   normal except gastric polyps->Dr. Derrill Kay.  Pt was then changed from protonix to aciphex   [employer]  03/2013   14/4, EEP=11   History reviewed. No pertinent family history. Social History   Socioeconomic History   Marital status: Married    Spouse name: Not on file   Number of children: 2   Years of education: Not on file   Highest education level: Not on file  Occupational History   Occupation: retired  Tobacco Use   Smoking status: Former    Types: Cigars    Quit date: 08/28/1974    Years since quitting: 47.4   Smokeless tobacco: Never  Substance and Sexual Activity   Alcohol use: No   Drug use: No   Sexual activity: Not on file  Other Topics Concern   Not on file  Social History Narrative   Married, 2 sons, 4 grandchildren.   Orig from [place].   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   No cigs, no alcohol, no drugs.   No exercise.   Social Determinants of Health   Financial Resource Strain: Low Risk    Difficulty of Paying Living Expenses: Not hard at all  Food Insecurity: No Food Insecurity   Worried About Charity fundraiser in the Last Year:  Never true   [place] in the Last Year: Never true  Transportation Needs: No Transportation Needs   Lack of Transportation (Medical): No   Lack of Transportation (Non-Medical): No  Physical Activity: Insufficiently Active   Days of Exercise per Week: 2 days   Minutes of Exercise per Session: 10 min  Stress: No Stress Concern Present   Feeling of Stress : Not at all  Social Connections: Socially Integrated   Frequency of Communication with Friends and Family: More than three times a week   Frequency of Social Gatherings with Friends and Family: More than three times a week  Attends Religious Services: More than 4 times per year   Active Member of Clubs or Organizations: Yes   Attends Archivist Meetings: 1 to 4 times per year   Marital Status: Married    Tobacco Counseling Counseling given: Not Answered   Clinical Intake:  Pre-visit preparation completed: Yes  Pain : No/denies pain     BMI - recorded: 34.88 Nutritional Status: BMI > 30  Obese Nutritional Risks: None Diabetes: No  How often do you need to have someone help you when you read instructions, pamphlets, or other written materials from your doctor or pharmacy?: 1 - Never  Diabetic?no  Interpreter Needed?: No  Information entered by :: Charlott Rakes, LPN   Activities of Daily Living    01/25/2022    8:11 AM  In your present state of health, do you have any difficulty performing the following activities:  Hearing? 1  Comment wears hearing aids  Vision? 0  Difficulty concentrating or making decisions? 0  Walking or climbing stairs? 0  Dressing or bathing? 0  Doing errands, shopping? 0  Preparing Food and eating ? N  Using the Toilet? N  In the past six months, have you accidently leaked urine? N  Do you have problems with loss of bowel control? Y  Managing your Medications? N  Managing your Finances? N  Housekeeping or managing your Housekeeping? N    Patient Care  Team: Tammi Sou, MD as PCP - General (Family Medicine) Aviva Signs, MD as Consulting Physician (Gastroenterology) Darreld Mclean, MD as Consulting Physician (Dermatology) Veneda Melter, MD as Consulting Physician (Cardiology) Scherrie November, MD as Consulting Physician (Gastroenterology)  Indicate any recent Medical Services you may have received from other than [facility] providers in the past year (date may be approximate).     Assessment:   This is a routine wellness examination for Jeffrey Little.  Hearing/Vision screen Hearing Screening - Comments:: Pt wears a hearing aids  Vision Screening - Comments:: Pt follows up With Dr Raenette Rover for annual eye exams   Dietary issues and exercise activities discussed: Current Exercise Habits: Home exercise routine, Type of exercise: Other - see comments, Time (Minutes): 10, Frequency (Times/Week): 3, Weekly Exercise (Minutes/Week): 30   Goals Addressed             This Visit's Progress    Patient Stated       Lose weight        Depression Screen    01/25/2022    8:08 AM 08/31/2021    1:05 PM 01/19/2021    2:20 PM 01/12/2021    1:05 PM 09/19/2019    8:02 AM 03/19/2019    9:03 AM 12/21/2017    8:48 AM  PHQ 2/9 Scores  PHQ - 2 Score 0 0 0 0 0 0 2  PHQ- 9 Score       6    Fall Risk    01/25/2022    8:10 AM 08/31/2021    1:05 PM 01/19/2021    2:19 PM 01/12/2021    1:04 PM 09/19/2019    8:02 AM  [place] in the past year? 0 0 0 0 0  Number falls in past yr: 0 0 0 0 0  Injury with Fall? 0 0 0 0 0  Follow up Falls prevention discussed Falls evaluation completed Falls prevention discussed Falls evaluation completed Falls evaluation completed    FALL RISK PREVENTION PERTAINING TO THE HOME:  Any stairs in or around  the home? Yes  If so, are there any without handrails? No  Home free of loose throw rugs in walkways, pet beds, electrical cords, etc? Yes  Adequate lighting in your home to reduce risk of falls? Yes    ASSISTIVE DEVICES UTILIZED TO PREVENT FALLS:  Life alert? No  Use of a cane, walker or w/c? No  Grab bars in the bathroom? Yes  Shower chair or bench in shower? Yes  Elevated toilet seat or a handicapped toilet? No   TIMED UP AND GO:  Was the test performed? No .  Cognitive Function:        01/25/2022    8:11 AM  6CIT Screen  What Year? 0 points  What month? 0 points  What time? 0 points  Count back from 20 0 points  Months in reverse 0 points  Repeat phrase 0 points  Total Score 0 points    Immunizations Immunization History  Administered Date(s) Administered   Fluad Quad(high Dose 65+) 07/05/2021   Influenza Split 07/29/2011, 06/27/2012   Influenza Whole 08/11/2010   Influenza, High Dose Seasonal PF 06/22/2015, 06/21/2016, 06/22/2017, 06/18/2018, 05/26/2019, 06/03/2020   Influenza,inj,Quad PF,6+ Mos 06/02/2014   Moderna Sars-Covid-2 Vaccination 09/29/2019, 10/28/2019, 07/30/2020, 12/08/2020   Pneumococcal Conjugate-13 06/02/2014   Pneumococcal Polysaccharide-23 08/11/2010, 06/22/2017   Tdap 09/15/2011    TDAP status: Due, Education has been provided regarding the importance of this vaccine. Advised may receive this vaccine at local pharmacy or Health Dept. Aware to provide a copy of the vaccination record if obtained from local pharmacy or Health Dept. Verbalized acceptance and understanding.  Flu Vaccine status: Up to date  Pneumococcal vaccine status: Up to date  Covid-19 vaccine status: Completed vaccines  Qualifies for Shingles Vaccine? Yes   Zostavax completed No   Shingrix Completed?: No.    Education has been provided regarding the importance of this vaccine. Patient has been advised to call insurance company to determine out of pocket expense if they have not yet received this vaccine. Advised may also receive vaccine at local pharmacy or Health Dept. Verbalized acceptance and understanding.  Screening Tests Health Maintenance  Topic Date Due    Hepatitis C Screening  Never done   Zoster Vaccines- Shingrix (1 of 2) Never done   TETANUS/TDAP  09/14/2021   COVID-19 Vaccine (5 - Booster for Moderna series) 02/05/2022 (Originally 02/02/2021)   INFLUENZA VACCINE  03/28/2022   COLONOSCOPY (Pts 45-24yr Insurance coverage will need to be confirmed)  06/13/2024   Pneumonia Vaccine 76 Years old  Completed   HPV VACCINES  Aged Out    Health Maintenance  Health Maintenance Due  Topic Date Due   Hepatitis C Screening  Never done   Zoster Vaccines- Shingrix (1 of 2) Never done   TETANUS/TDAP  09/14/2021    Colorectal cancer screening: Type of screening: Colonoscopy. Completed 06/13/21. Repeat every 3 years   Additional Screening:  Hepatitis C Screening: does qualify;  Vision Screening: Recommended annual ophthalmology exams for early detection of glaucoma and other disorders of the eye. Is the patient up to date with their annual eye exam?  Yes  Who is the provider or what is the name of the office in which the patient attends annual eye exams? Dr HRaenette RoverIf pt is not established with a provider, would they like to be referred to a provider to establish care? .   Dental Screening: Recommended annual dental exams for proper oral hygiene  Community Resource Referral / Chronic Care Management: CRR  required this visit?  No   CCM required this visit?  No      Plan:     I have personally reviewed and noted the following in the patient's chart:   Medical and social history Use of alcohol, tobacco or illicit drugs  Current medications and supplements including opioid prescriptions. Patient is not currently taking opioid prescriptions. Functional ability and status Nutritional status Physical activity Advanced directives List of other physicians Hospitalizations, surgeries, and ER visits in previous 12 months Vitals Screenings to include cognitive, depression, and falls Referrals and appointments  In addition, I have  reviewed and discussed with patient certain preventive protocols, quality metrics, and best practice recommendations. A written personalized care plan for preventive services as well as general preventive health recommendations were provided to patient.     Willette Brace, LPN   11/08/2765   Nurse Notes: None

## 2022-01-26 ENCOUNTER — Ambulatory Visit (INDEPENDENT_AMBULATORY_CARE_PROVIDER_SITE_OTHER): Payer: PPO | Admitting: Family Medicine

## 2022-01-26 ENCOUNTER — Encounter: Payer: Self-pay | Admitting: Family Medicine

## 2022-01-26 ENCOUNTER — Telehealth: Payer: Self-pay

## 2022-01-26 VITALS — BP 143/81 | HR 73 | Temp 98.0°F | Ht 65.0 in | Wt 210.4 lb

## 2022-01-26 DIAGNOSIS — I4821 Permanent atrial fibrillation: Secondary | ICD-10-CM | POA: Diagnosis not present

## 2022-01-26 DIAGNOSIS — E291 Testicular hypofunction: Secondary | ICD-10-CM

## 2022-01-26 DIAGNOSIS — D751 Secondary polycythemia: Secondary | ICD-10-CM

## 2022-01-26 DIAGNOSIS — I10 Essential (primary) hypertension: Secondary | ICD-10-CM | POA: Diagnosis not present

## 2022-01-26 DIAGNOSIS — Z87898 Personal history of other specified conditions: Secondary | ICD-10-CM

## 2022-01-26 LAB — CBC
HCT: 53.7 % — ABNORMAL HIGH (ref 39.0–52.0)
Hemoglobin: 18.2 g/dL (ref 13.0–17.0)
MCHC: 34 g/dL (ref 30.0–36.0)
MCV: 91.2 fl (ref 78.0–100.0)
Platelets: 182 10*3/uL (ref 150.0–400.0)
RBC: 5.89 Mil/uL — ABNORMAL HIGH (ref 4.22–5.81)
RDW: 13.9 % (ref 11.5–15.5)
WBC: 6.8 10*3/uL (ref 4.0–10.5)

## 2022-01-26 LAB — BASIC METABOLIC PANEL
BUN: 24 mg/dL — ABNORMAL HIGH (ref 6–23)
CO2: 29 mEq/L (ref 19–32)
Calcium: 10 mg/dL (ref 8.4–10.5)
Chloride: 100 mEq/L (ref 96–112)
Creatinine, Ser: 1.33 mg/dL (ref 0.40–1.50)
GFR: 51.92 mL/min — ABNORMAL LOW (ref >60.00)
Glucose, Bld: 142 mg/dL — ABNORMAL HIGH (ref 70–99)
Potassium: 4.2 mEq/L (ref 3.5–5.1)
Sodium: 140 mEq/L (ref 135–145)

## 2022-01-26 MED ORDER — VALSARTAN 320 MG PO TABS: 320.0000 mg | ORAL_TABLET | Freq: Every day | ORAL | 1 refills | Status: DC

## 2022-01-26 NOTE — Patient Instructions (Signed)
Stop losartan.  Start valsartan daily (prescription sent today).

## 2022-01-26 NOTE — Telephone Encounter (Signed)
CRITICAL VALUE STICKER  CRITICAL VALUE:  hemoglobin 18.2  RECEIVER (on-site recipient of call): [place] NOTIFIED: [id number]  MESSENGER (representative from lab): Roger Kill  MD NOTIFIED: [place]  TIME OF NOTIFICATION: [id number]  RESPONSE:

## 2022-01-26 NOTE — Progress Notes (Signed)
OFFICE VISIT  01/26/2022  CC:  Chief Complaint  Patient presents with   Hypertension    Pt is not fasting    Patient is a 76 y.o. male who presents for 1 week follow-up uncontrolled hypertension. A/P as of last visit: "#1 uncontrolled hypertension. I do not think the decrease Cardizem dosing that his cardiologist did 5/5 is the cause of his elevated blood pressure lately. Hard to tell when this started because he was not monitoring blood pressure prior to the decrease. His home blood pressure cuff does read slightly higher than ours here today--- about 15 points systolic. Heart rate consistently higher/improved last 2 weeks. Today we chose to increase his losartan to 100 mg. No change in bisoprolol-HCTZ (5/6.25 qd), or diltiazem (180) CD qd. If further blood pressure control needed then neck step would be to add hydralazine.   #2  Permanent A-fib. Heart rate in better range since cardiologist decreased his Cardizem CD to 180 mg once a day. Continue this medication as well as Coumadin."  INTERIM HX: He is feeling well. He has noted improved energy since his cardiologist decreased his dose of Cardizem. Home blood pressures still consistently 161W systolic over 96E diastolic.  Occasionally gets up as high as 150s over 110s to 120.  Heart rates 60s to 80s No headaches, dizziness, vision changes, chest pain, palpitations, or shortness of breath.  He does take testosterone gel 3 pumps 3 days a week and has done so long-term.  ROS as above, plus--> no fevers,no wheezing, no cough,  no rashes, no melena/hematochezia.  No polyuria or polydipsia.  No myalgias or arthralgias.  No focal weakness, paresthesias, or tremors.  No acute vision or hearing abnormalities.  No dysuria or unusual/new urinary urgency or frequency.  No recent changes in lower legs. No n/v/d or abd pain.    Past Medical History:  Diagnosis Date   BPH (benign prostatic hyperplasia)    Cataract    Right; impairing  vision as of 05/2017. (has ophthalmologist)   Chronic renal insufficiency, stage II (mild)    CrCl in the 60s   Coronary artery disease    GERD (gastroesophageal reflux disease)    Hearing loss    In a study at [facility] as of 2019   Hx of colonic polyps    03/2017 and 05/2021--adenomatous.  Recall 3 yrs. ([facility] spec)   Hypercholesteremia    Pt refuses statins (as of 05/2016). Declined again 02/2019.   Hypertension    IBS (irritable bowel syndrome)    OSA on CPAP    Helene Kelp Day, NP at Neurology-Sleep Medicine at [facility]   Peripheral vascular disease [facility])    Permanent atrial fibrillation ([place]) 2001   failed DCCV per pt.  Rate control + coumadin--monitored by cardiologist.   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  Colchicine started by UC   Polycythemia    Pt states he had w/u for hemachromatosis but it was NEG.  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   Prediabetes 06/22/2015   123.  A1c was 5.9%.  Stable at 6.0% 05/2016.  A1c 6.0% 11/2017. 5.9% 08/2019.   Prostate cancer screening    Pt declines any further prostate ca screening as of 05/2015.   Reactive airway disease    Scoliosis    Testosterone deficiency    Tick bite    tick removed from scrotum 02/08/20 (Deer tick per urgent care  provider)    Past Surgical History:  Procedure Laterality Date   aortic ultrasound  09/2019   AAA screening; prox aorta 3cm->ULN, no aneurism.   CARDIAC CATHETERIZATION  approx 2001   nonobstruc CAD   CIRCUMCISION     COLONOSCOPY W/ POLYPECTOMY  05/12/11; 04/27/17   2018 and 05/2021--adenomatous polyp; repeat 3 yrs   NASAL POLYP SURGERY     SKIN CANCER EXCISION     Non-malanoma   TRANSTHORACIC ECHOCARDIOGRAM  10/2019   EF 55-60%, nl LV syst fxn, severe LA and RA dilation, valves fine.   UPPER GI ENDOSCOPY  10/07/2021   normal except gastric polyps->Dr. Derrill Kay.  Pt was then changed from protonix to aciphex   [place]  03/2013   14/4, EEP=11    Outpatient Medications Prior to Visit  Medication Sig Dispense Refill   albuterol (VENTOLIN HFA) 108 (90 Base) MCG/ACT inhaler Inhale 2 puffs into the lungs every 4 (four) hours as needed for wheezing or shortness of breath. 1 each 0   bisoprolol-hydrochlorothiazide (ZIAC) 5-6.25 MG tablet Take 1 tablet by mouth daily. 90 tablet 3   Calcium Carbonate-Vitamin D 600-200 MG-UNIT TABS Take 1 tablet by mouth 2 (two) times daily.     colchicine 0.6 MG tablet      diltiazem (CARDIZEM CD) 180 MG 24 hr capsule Take 1 capsule (180 mg total) by mouth 2 (two) times daily. (Patient taking differently: Take 180 mg by mouth daily.) 180 capsule 3   Glucosamine-Chondroit-Vit C-Mn (GLUCOSAMINE-CHONDROITIN MAX ST PO) Take 2 capsules by mouth daily.      losartan (COZAAR) 50 MG tablet 2 tabs qd 90 tablet 0   Multiple Vitamins-Minerals (MENS MULTIVITAMIN PLUS PO) Take 1 tablet by mouth daily.     Testosterone 12.5 MG/ACT (1%) GEL apply 3 pumps onto THE SKIN three DAYS PER WEEK 150 g 5   warfarin (COUMADIN) 2.5 MG tablet Take by mouth.     RABEprazole (ACIPHEX) 20 MG tablet Take 20 mg by mouth in the morning and at bedtime.     No facility-administered medications prior to visit.    Allergies  Allergen Reactions   Lac Bovis Other (See Comments)    Causes reflux if consumed in the pm.   Atorvastatin     REACTION: INTOL to Lipitor   Pollen Extract Other (See Comments)    Stuffy nose    ROS As per HPI  PE:    01/26/2022   10:38 AM 01/20/2022    9:49 AM 01/04/2022    3:25 PM  Vitals with BMI  Height '5\' 5"'$  '5\' 5"'$  '5\' 5"'$   Weight 210 lbs 6 oz 209 lbs 10 oz 211 lbs  BMI 35.01 00.93 81.82  Systolic 993 716 967  Diastolic 81 90 69  Pulse 73 58 70     Physical Exam  Gen: Alert, well appearing.  Patient is oriented to person, place, time, and situation. AFFECT: pleasant, lucid thought and speech. No further exam today  LABS:   Lab Results  Component Value Date    TESTOSTERONE 323.16 08/31/2021   Lab Results  Component Value Date   WBC 7.5 08/31/2021   HGB 17.3 (H) 08/31/2021   HCT 50.3 08/31/2021   MCV 91.7 08/31/2021   PLT 179.0 08/31/2021     Chemistry      Component Value Date/Time   NA 139 08/31/2021 1341   K 4.1 08/31/2021 1341   CL 101 08/31/2021 1341   CO2 29 08/31/2021  1341   BUN 19 08/31/2021 1341   CREATININE 1.21 08/31/2021 1341      Component Value Date/Time   CALCIUM 9.3 08/31/2021 1341   ALKPHOS 63 08/31/2021 1341   AST 17 08/31/2021 1341   ALT 18 08/31/2021 1341   BILITOT 1.0 08/31/2021 1341     IMPRESSION AND PLAN:  #1 uncontrolled hypertension. We have to hold bisoprolol-HCTZ at the 5-6.25 mg daily dose.  We will try switching from losartan 100 daily to valsartan 320 daily to see if he responds to this better.  #2 Permanent A-fib. Heart rate in better range and he feels better since cardiologist decreased his Cardizem CD to 180 mg once a day. 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  Doing well on testosterone gel 3 pumps 3 days/week. Last testosterone level was 323 about 6 months ago. Last hemoglobin was 17.3 6 months ago. Monitor hemoglobin today  An After Visit Summary was printed and given to the patient.  FOLLOW UP: Return for keep appt set for 03/02/22.  Signed:  Crissie Sickles, MD           01/26/2022

## 2022-02-02 ENCOUNTER — Telehealth: Payer: Self-pay

## 2022-02-02 DIAGNOSIS — I482 Chronic atrial fibrillation, unspecified: Secondary | ICD-10-CM | POA: Diagnosis not present

## 2022-02-02 DIAGNOSIS — Z7901 Long term (current) use of anticoagulants: Secondary | ICD-10-CM | POA: Diagnosis not present

## 2022-02-02 DIAGNOSIS — Z5181 Encounter for therapeutic drug level monitoring: Secondary | ICD-10-CM | POA: Diagnosis not present

## 2022-02-02 NOTE — Telephone Encounter (Signed)
Dr Kimber Relic (pulmonary) called to notify PCP of elevated bp in office visit 151/104 147/120 no sx as of now. Just wanted to let PCP know since he was seen recently.

## 2022-02-02 NOTE — Telephone Encounter (Signed)
FYI  Please see below

## 2022-02-20 ENCOUNTER — Other Ambulatory Visit: Payer: Self-pay | Admitting: Family Medicine

## 2022-03-02 ENCOUNTER — Encounter: Payer: PPO | Admitting: Family Medicine

## 2022-03-14 DIAGNOSIS — I482 Chronic atrial fibrillation, unspecified: Secondary | ICD-10-CM | POA: Diagnosis not present

## 2022-03-14 DIAGNOSIS — Z5181 Encounter for therapeutic drug level monitoring: Secondary | ICD-10-CM | POA: Diagnosis not present

## 2022-03-14 DIAGNOSIS — Z7901 Long term (current) use of anticoagulants: Secondary | ICD-10-CM | POA: Diagnosis not present

## 2022-03-24 ENCOUNTER — Ambulatory Visit (INDEPENDENT_AMBULATORY_CARE_PROVIDER_SITE_OTHER): Payer: PPO | Admitting: Family Medicine

## 2022-03-24 ENCOUNTER — Encounter: Payer: Self-pay | Admitting: Family Medicine

## 2022-03-24 VITALS — BP 140/80 | HR 73 | Temp 97.7°F | Ht 65.0 in | Wt 208.6 lb

## 2022-03-24 DIAGNOSIS — Z0000 Encounter for general adult medical examination without abnormal findings: Secondary | ICD-10-CM | POA: Diagnosis not present

## 2022-03-24 DIAGNOSIS — N1831 Chronic kidney disease, stage 3a: Secondary | ICD-10-CM

## 2022-03-24 DIAGNOSIS — R7303 Prediabetes: Secondary | ICD-10-CM

## 2022-03-24 DIAGNOSIS — I10 Essential (primary) hypertension: Secondary | ICD-10-CM

## 2022-03-24 DIAGNOSIS — D751 Secondary polycythemia: Secondary | ICD-10-CM | POA: Diagnosis not present

## 2022-03-24 LAB — HEMOGLOBIN A1C: Hgb A1c MFr Bld: 6.2 % (ref 4.6–6.5)

## 2022-03-24 LAB — COMPREHENSIVE METABOLIC PANEL
ALT: 21 U/L (ref 0–53)
AST: 20 U/L (ref 0–37)
Albumin: 4.7 g/dL (ref 3.5–5.2)
Alkaline Phosphatase: 55 U/L (ref 39–117)
BUN: 20 mg/dL (ref 6–23)
CO2: 29 mEq/L (ref 19–32)
Calcium: 9.7 mg/dL (ref 8.4–10.5)
Chloride: 100 mEq/L (ref 96–112)
Creatinine, Ser: 1.17 mg/dL (ref 0.40–1.50)
GFR: 60.49 mL/min (ref >60.00)
Glucose, Bld: 121 mg/dL — ABNORMAL HIGH (ref 70–99)
Potassium: 4.4 mEq/L (ref 3.5–5.1)
Sodium: 138 mEq/L (ref 135–145)
Total Bilirubin: 1.2 mg/dL (ref 0.2–1.2)
Total Protein: 7.5 g/dL (ref 6.0–8.3)

## 2022-03-24 LAB — CBC
HCT: 52.2 % — ABNORMAL HIGH (ref 39.0–52.0)
Hemoglobin: 17.7 g/dL — ABNORMAL HIGH (ref 13.0–17.0)
MCHC: 34 g/dL (ref 30.0–36.0)
MCV: 92.5 fl (ref 78.0–100.0)
Platelets: 166 10*3/uL (ref 150.0–400.0)
RBC: 5.65 Mil/uL (ref 4.22–5.81)
RDW: 14.3 % (ref 11.5–15.5)
WBC: 5.3 10*3/uL (ref 4.0–10.5)

## 2022-03-24 LAB — LDL CHOLESTEROL, DIRECT: Direct LDL: 129 mg/dL

## 2022-03-24 LAB — LIPID PANEL
Cholesterol: 220 mg/dL — ABNORMAL HIGH (ref 0–200)
HDL: 37.3 mg/dL — ABNORMAL LOW (ref >39.00)
NonHDL: 182.63
Total CHOL/HDL Ratio: 6
Triglycerides: 244 mg/dL — ABNORMAL HIGH (ref 0.0–149.0)
VLDL: 48.8 mg/dL — ABNORMAL HIGH (ref 0.0–40.0)

## 2022-03-24 MED ORDER — AMLODIPINE BESYLATE 10 MG PO TABS: 10.0000 mg | ORAL_TABLET | Freq: Every day | ORAL | 0 refills | Status: DC

## 2022-03-24 MED ORDER — VALSARTAN 320 MG PO TABS: 320.0000 mg | ORAL_TABLET | Freq: Every day | ORAL | 1 refills | Status: DC

## 2022-03-24 NOTE — Progress Notes (Signed)
OFFICE VISIT  03/24/2022  CC:  Chief Complaint  Patient presents with   Annual Exam    Pt is fasting   Patient is a 76 y.o. male who presents for annual health maintenance exam and f/u uncontrolled HTN, male hypogonadism, and CRI III. A/P as of last visit: "#1 uncontrolled hypertension. We have to hold bisoprolol-HCTZ at the 5-6.25 mg daily dose.  We will try switching from losartan 100 daily to valsartan 320 daily to see if he responds to this better.   #2 Permanent A-fib. Heart rate in better range and he feels better since cardiologist decreased his Cardizem CD to 180 mg once a day. 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  Doing well on testosterone gel 3 pumps 3 days/week. Last testosterone level was 323 about 6 months ago. Last hemoglobin was 17.3 6 months ago. Monitor hemoglobin today"  INTERIM HX: Feeling fine. BPs 120-160 over 80-110. He did switch over from losartan to valsartan as instructed last visit.  He avoids NSAIDs, tries to hydrate well.  He is applying his testosterone gel 3 days a week.  Past Medical History:  Diagnosis Date   BPH (benign prostatic hyperplasia)    Cataract    Right; impairing vision as of 05/2017. (has ophthalmologist)   Chronic renal insufficiency, stage II (mild)    CrCl in the 60s   Coronary artery disease    GERD (gastroesophageal reflux disease)    Hearing loss    In a study at [facility] as of 2019   Hx of colonic polyps    03/2017 and 05/2021--adenomatous.  Recall 3 yrs. ([facility] spec)   Hypercholesteremia    Pt refuses statins (as of 05/2016). Declined again 02/2019.   Hypertension    IBS (irritable bowel syndrome)    OSA on CPAP    Helene Kelp Day, NP at Neurology[facility] Medicine at [facility]   Peripheral vascular disease [facility])    Permanent atrial fibrillation ([place]) 2001   failed DCCV per pt.  Rate control + coumadin--monitored by cardiologist.   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  Colchicine started by UC   Polycythemia    Pt states he had w/u for hemachromatosis but it was NEG.  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   Prediabetes 06/22/2015   123.  A1c was 5.9%.  Stable at 6.0% 05/2016.  A1c 6.0% 11/2017. 5.9% 08/2019.   Prostate cancer screening    Pt declines any further prostate ca screening as of 05/2015.   Reactive airway disease    Scoliosis    Testosterone deficiency    Tick bite    tick removed from scrotum 02/08/20 (Deer tick per urgent care provider)    Past Surgical History:  Procedure Laterality Date   aortic ultrasound  09/2019   AAA screening; prox aorta 3cm->ULN, no aneurism.   CARDIAC CATHETERIZATION  approx 2001   nonobstruc CAD   CIRCUMCISION     COLONOSCOPY W/ POLYPECTOMY  05/12/11; 04/27/17   2018 and 05/2021--adenomatous polyp; repeat 3 yrs   NASAL POLYP SURGERY     SKIN CANCER EXCISION     Non-malanoma   TRANSTHORACIC ECHOCARDIOGRAM  10/2019   EF 55-60%, nl LV syst fxn, severe LA and RA dilation, valves fine.   UPPER GI ENDOSCOPY  10/07/2021   normal except gastric polyps->Dr. Derrill Kay.  Pt was then changed from protonix to aciphex  VASECTOMY  1983   VPAP  03/2013   14/4, EEP=11   Social History   Socioeconomic History   Marital status: Married    Spouse name: Not on file   Number of children: 2   Years of education: Not on file   Highest education level: Not on file  Occupational History   Occupation: retired  Tobacco Use   Smoking status: Former    Types: Cigars    Quit date: 08/28/1974    Years since quitting: 47.6   Smokeless tobacco: Never  Substance and Sexual Activity   Alcohol use: No   Drug use: No   Sexual activity: Not on file  Other Topics Concern   Not on file  Social History Narrative   Married, 2 sons, 4 grandchildren.   Orig from [place].   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   No cigs, no  alcohol, no drugs.   No exercise.   Social Determinants of Health   Financial Resource Strain: Low Risk  (01/25/2022)   Overall Financial Resource Strain (CARDIA)    Difficulty of Paying Living Expenses: Not hard at all  Food Insecurity: No Food Insecurity (01/25/2022)   Hunger Vital Sign    Worried About Running Out of Food in the Last Year: Never true    Ran Out of Food in the Last Year: Never true  Transportation Needs: No Transportation Needs (01/25/2022)   PRAPARE - Hydrologist (Medical): No    Lack of Transportation (Non-Medical): No  Physical Activity: Insufficiently Active (01/25/2022)   Exercise Vital Sign    Days of Exercise per Week: 2 days    Minutes of Exercise per Session: 10 min  Stress: No Stress Concern Present (01/25/2022)   [place]    Feeling of Stress : Not at all  Social Connections: [place] (01/25/2022)   Social Connection and Isolation Panel [NHANES]    Frequency of Communication with Friends and Family: More than three times a week    Frequency of Social Gatherings with Friends and Family: More than three times a week    Attends Religious Services: More than 4 times per year    Active Member of [employer] or Organizations: Yes    Attends Archivist Meetings: 1 to 4 times per year    Marital Status: Married   History reviewed. No pertinent family history.  Outpatient Medications Prior to Visit  Medication Sig Dispense Refill   albuterol (VENTOLIN HFA) 108 (90 Base) MCG/ACT inhaler Inhale 2 puffs into the lungs every 4 (four) hours as needed for wheezing or shortness of breath. 1 each 0   bisoprolol-hydrochlorothiazide (ZIAC) 5-6.25 MG tablet Take 1 tablet by mouth daily. 90 tablet 3   Calcium Carbonate-Vitamin D 600-200 MG-UNIT TABS Take 1 tablet by mouth 2 (two) times daily.     colchicine 0.6 MG tablet      diltiazem (CARDIZEM CD) 180 MG 24 hr  capsule Take 1 capsule (180 mg total) by mouth 2 (two) times daily. (Patient taking differently: Take 180 mg by mouth daily.) 180 capsule 3   Glucosamine-Chondroit-Vit C-Mn (GLUCOSAMINE-CHONDROITIN MAX ST PO) Take 2 capsules by mouth daily.      Multiple Vitamins-Minerals (MENS MULTIVITAMIN PLUS PO) Take 1 tablet by mouth daily.     Testosterone 12.5 MG/ACT (1%) GEL apply 3 pumps onto THE SKIN three DAYS PER WEEK 150 g 5  warfarin (COUMADIN) 2.5 MG tablet Take by mouth.     losartan (COZAAR) 50 MG tablet 2 tabs qd 90 tablet 0   RABEprazole (ACIPHEX) 20 MG tablet Take 20 mg by mouth in the morning and at bedtime.     valsartan (DIOVAN) 320 MG tablet TAKE ONE TABLET BY MOUTH EVERY DAY 30 tablet 1   No facility-administered medications prior to visit.    Allergies  Allergen Reactions   Milk (Cow) Other (See Comments)    Causes reflux if consumed in the pm.   Atorvastatin     REACTION: INTOL to Lipitor   Pollen Extract Other (See Comments)    Stuffy nose     Review of Systems  Constitutional:  Negative for appetite change, chills, fatigue and fever.  HENT:  Negative for congestion, dental problem, ear pain and sore throat.   Eyes:  Negative for discharge, redness and visual disturbance.  Respiratory:  Negative for cough, chest tightness, shortness of breath and wheezing.   Cardiovascular:  Negative for chest pain, palpitations and leg swelling.  Gastrointestinal:  Negative for abdominal pain, blood in stool, diarrhea, nausea and vomiting.  Genitourinary:  Negative for difficulty urinating, dysuria, flank pain, frequency, hematuria and urgency.  Musculoskeletal:  Negative for arthralgias, back pain, joint swelling, myalgias and neck stiffness.  Skin:  Negative for pallor and rash.  Neurological:  Negative for dizziness, speech difficulty, weakness and headaches.  Hematological:  Negative for adenopathy. Does not bruise/bleed easily.  Psychiatric/Behavioral:  Negative for confusion and  sleep disturbance. The patient is not nervous/anxious.      PE:    03/24/2022    8:32 AM 03/24/2022    8:06 AM 01/26/2022   10:38 AM  Vitals with BMI  Height  '5\' 5"'$  '5\' 5"'$   Weight  208 lbs 10 oz 210 lbs 6 oz  BMI  71.69 67.89  Systolic 381 017 510  Diastolic 80 94 81  Pulse  73 73     Physical Exam  Gen: Alert, well appearing.  Patient is oriented to person, place, time, and situation. AFFECT: pleasant, lucid thought and speech. ENT: Ears: EACs clear, normal epithelium.  TMs with good light reflex and landmarks bilaterally.  Eyes: no injection, icteris, swelling, or exudate.  EOMI, PERRLA. Nose: no drainage or turbinate edema/swelling.  No injection or focal lesion.  Mouth: lips without lesion/swelling.  Oral mucosa pink and moist.  Dentition intact and without obvious caries or gingival swelling.  Oropharynx without erythema, exudate, or swelling.  Neck: supple/nontender.  No LAD, mass, or TM.  Carotid pulses 2+ bilaterally, without bruits. CV: Irreg irreg, no m/r/g.   LUNGS: CTA bilat, nonlabored resps, good aeration in all lung fields. ABD: soft, NT, ND, BS normal.  No hepatospenomegaly or mass.  No bruits. EXT: no clubbing, cyanosis, or edema.  Musculoskeletal: no joint swelling, erythema, warmth, or tenderness.  ROM of all joints intact. Skin - no sores or suspicious lesions or rashes or color changes   LABS:  Last CBC Lab Results  Component Value Date   WBC 6.8 01/26/2022   HGB 18.2 Repeated and verified X2. (HH) 01/26/2022   HCT 53.7 (H) 01/26/2022   MCV 91.2 01/26/2022   MCH 31.6 12/07/2015   RDW 13.9 01/26/2022   PLT 182.0 25/85/2778   Last metabolic panel Lab Results  Component Value Date   GLUCOSE 142 (H) 01/26/2022   NA 140 01/26/2022   K 4.2 01/26/2022   CL 100 01/26/2022   CO2 29 01/26/2022  BUN 24 (H) 01/26/2022   CREATININE 1.33 01/26/2022   CALCIUM 10.0 01/26/2022   PROT 7.2 08/31/2021   ALBUMIN 4.3 08/31/2021   BILITOT 1.0 08/31/2021    ALKPHOS 63 08/31/2021   AST 17 08/31/2021   ALT 18 08/31/2021   Last lipids Lab Results  Component Value Date   CHOL 208 (H) 09/19/2019   HDL 38.30 (L) 09/19/2019   LDLCALC 108 (H) 06/24/2018   LDLDIRECT 129.0 09/19/2019   TRIG 217.0 (H) 09/19/2019   CHOLHDL 5 09/19/2019   Last hemoglobin A1c Lab Results  Component Value Date   HGBA1C 6.1 08/31/2021   Lab Results  Component Value Date   PSA 2.19 12/21/2017   PSA 2.15 06/21/2016   PSA 2.53 12/01/2013   Last thyroid functions Lab Results  Component Value Date   TSH 0.60 12/01/2013   Lab Results  Component Value Date   TESTOSTERONE 323.16 08/31/2021   IMPRESSION AND PLAN:  #1 uncontrolled hypertension. Add amlodipine 10 mg/day and continue valsartan 320 mg a day, bisoprolol-hydrochlorothiazide 5-6.25 mg/day, and diltiazem CD1 180 mg 2 times a day. 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 He states he is unable to give blood with [facility] because he is on Coumadin. We will recheck hemoglobin today and if greater than 18 we will have him get phlebotomy via our cancer center.  #3 chronic renal insufficiency stage III. Monitor serum creatinine and electrolytes today.  #4 prediabetes. Hemoglobin A1c today.  #5 Health maintenance exam: Reviewed age and gender appropriate health maintenance issues (prudent diet, regular exercise, health risks of tobacco and excessive alcohol, use of seatbelts, fire alarms in home, use of sunscreen).  Also reviewed age and gender appropriate health screening as well as vaccine recommendations. Vaccines: pt declines shingrix.  Tdap->UTD. Labs: cbc,cmet, lipids, Hba1c Prostate ca screening: pt declines further screening. Colon ca screening: hx polyps, recall 05/2024   An After Visit Summary was printed and given to the patient.  FOLLOW UP: Return in about 2 weeks (around 04/07/2022) for f/u HTN.  Signed:  Crissie Sickles,  MD           03/24/2022

## 2022-04-07 ENCOUNTER — Ambulatory Visit (INDEPENDENT_AMBULATORY_CARE_PROVIDER_SITE_OTHER): Payer: PPO | Admitting: Family Medicine

## 2022-04-07 ENCOUNTER — Encounter: Payer: Self-pay | Admitting: Family Medicine

## 2022-04-07 VITALS — BP 128/74 | HR 74 | Temp 97.7°F | Ht 65.0 in | Wt 210.4 lb

## 2022-04-07 DIAGNOSIS — E7800 Pure hypercholesterolemia, unspecified: Secondary | ICD-10-CM | POA: Diagnosis not present

## 2022-04-07 DIAGNOSIS — I10 Essential (primary) hypertension: Secondary | ICD-10-CM | POA: Diagnosis not present

## 2022-04-07 MED ORDER — DILTIAZEM HCL ER COATED BEADS 180 MG PO CP24: 180.0000 mg | ORAL_CAPSULE | Freq: Two times a day (BID) | ORAL | 1 refills | Status: DC

## 2022-04-07 MED ORDER — VALSARTAN 320 MG PO TABS: 320.0000 mg | ORAL_TABLET | Freq: Every day | ORAL | 1 refills | Status: DC

## 2022-04-07 MED ORDER — BISOPROLOL-HYDROCHLOROTHIAZIDE 5-6.25 MG PO TABS: ORAL_TABLET | ORAL | 1 refills | Status: DC

## 2022-04-07 MED ORDER — AMLODIPINE BESYLATE 10 MG PO TABS: 10.0000 mg | ORAL_TABLET | Freq: Every day | ORAL | 1 refills | Status: DC

## 2022-04-07 NOTE — Progress Notes (Signed)
OFFICE VISIT  77/06/2022  CC:  Chief Complaint  Patient presents with   Hypertension    Follow up; pt is not fasting   Patient is a 76 y.o. male who presents for 2-week follow-up uncontrolled hypertension. A/P as of last visit: "#1 uncontrolled hypertension. Add amlodipine 10 mg/day and continue valsartan 320 mg a day, bisoprolol-hydrochlorothiazide 5-6.25 mg/day, and diltiazem CD1 180 mg 2 times a day. 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 He states he is unable to give blood with [facility] because he is on Coumadin. We will recheck hemoglobin today and if greater than 18 we will have him get phlebotomy via our cancer center.   #3 chronic renal insufficiency stage III. Monitor serum creatinine and electrolytes today.   #4 prediabetes. Hemoglobin A1c today.   #5 Health maintenance exam: Reviewed age and gender appropriate health maintenance issues (prudent diet, regular exercise, health risks of tobacco and excessive alcohol, use of seatbelts, fire alarms in home, use of sunscreen).  Also reviewed age and gender appropriate health screening as well as vaccine recommendations. Vaccines: pt declines shingrix.  Tdap->UTD. Labs: cbc,cmet, lipids, Hba1c Prostate ca screening: pt declines further screening. Colon ca screening: hx polyps, recall 05/2024"  INTERIM HX:  John he feels well. No problems after starting amlodipine. No swelling. His current blood pressure medicine regimen is amlodipine 10 mg a day, bisoprolol-HCTZ 5-6.25 mg 1 tab daily, valsartan and 320 mg a day.  He also takes Cardizem CD 180 mg 1 tab daily.  The last 1 wk of BPs were reviewed today: 113-180 syst range, avg 150. Diastolics range 66-063, avg 90.   KZ60-10, avg 75.  Lipids elevated last checked 2 weeks ago, similar to past measurements. History of intolerance to atorvastatin.  He has been hesitant to start a trial of a different  statin. I do not think he would qualify for PCSK9 inhibitor.   Past Medical History:  Diagnosis Date   BPH (benign prostatic hyperplasia)    Cataract    Right; impairing vision as of 05/2017. (has ophthalmologist)   Chronic renal insufficiency, stage II (mild)    CrCl in the 60s   Coronary artery disease    GERD (gastroesophageal reflux disease)    Hearing loss    In a study at [facility] as of 2019   Hx of colonic polyps    03/2017 and 05/2021--adenomatous.  Recall 3 yrs. ([facility])   Hypercholesteremia    Pt refuses statins (as of 05/2016). Declined again 02/2019.   Hypertension    IBS (irritable bowel syndrome)    OSA on CPAP    Helene Kelp Day, NP at Neurology-Sleep Medicine at [facility]   Peripheral vascular disease [facility])    Permanent atrial fibrillation ([place]) 2001   failed DCCV per pt.  Rate control + coumadin--monitored by cardiologist.   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  Colchicine started by UC   Polycythemia    Pt states he had w/u for hemachromatosis but it was NEG.  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   Prediabetes 06/22/2015   123.  A1c was 5.9%.  Stable at 6.0% 05/2016.  A1c 6.0% 11/2017. 5.9% 08/2019.   Prostate cancer screening    Pt declines any further prostate ca screening as of 05/2015.   Reactive airway disease    Scoliosis    Testosterone deficiency  Tick bite    tick removed from scrotum 02/08/20 (Deer tick per urgent care provider)    Past Surgical History:  Procedure Laterality Date   aortic ultrasound  09/2019   AAA screening; prox aorta 3cm->ULN, no aneurism.   CARDIAC CATHETERIZATION  approx 2001   nonobstruc CAD   CIRCUMCISION     COLONOSCOPY W/ POLYPECTOMY  05/12/11; 04/27/17   2018 and 05/2021--adenomatous polyp; repeat 3 yrs   NASAL POLYP SURGERY     SKIN CANCER EXCISION     Non-malanoma   TRANSTHORACIC ECHOCARDIOGRAM  10/2019   EF 55-60%, nl LV syst fxn,  severe LA and RA dilation, valves fine.   UPPER GI ENDOSCOPY  10/07/2021   normal except gastric polyps->Dr. Derrill Kay.  Pt was then changed from protonix to aciphex   [place]  03/2013   14/4, EEP=11    Outpatient Medications Prior to Visit  Medication Sig Dispense Refill   albuterol (VENTOLIN HFA) 108 (90 Base) MCG/ACT inhaler Inhale 2 puffs into the lungs every 4 (four) hours as needed for wheezing or shortness of breath. 1 each 0   Calcium Carbonate-Vitamin D 600-200 MG-UNIT TABS Take 1 tablet by mouth 2 (two) times daily.     Glucosamine-Chondroit-Vit C-Mn (GLUCOSAMINE-CHONDROITIN MAX ST PO) Take 2 capsules by mouth daily.      Multiple Vitamins-Minerals (MENS MULTIVITAMIN PLUS PO) Take 1 tablet by mouth daily.     Testosterone 12.5 MG/ACT (1%) GEL apply 3 pumps onto THE SKIN three DAYS PER WEEK 150 g 5   warfarin (COUMADIN) 2.5 MG tablet Take by mouth.     bisoprolol-hydrochlorothiazide (ZIAC) 5-6.25 MG tablet Take 1 tablet by mouth daily. 90 tablet 3   valsartan (DIOVAN) 320 MG tablet Take 1 tablet (320 mg total) by mouth daily. 90 tablet 1   RABEprazole (ACIPHEX) 20 MG tablet Take 20 mg by mouth in the morning and at bedtime.     amLODipine (NORVASC) 10 MG tablet Take 1 tablet (10 mg total) by mouth daily. 30 tablet 0   colchicine 0.6 MG tablet      diltiazem (CARDIZEM CD) 180 MG 24 hr capsule Take 1 capsule (180 mg total) by mouth 2 (two) times daily. (Patient taking differently: Take 180 mg by mouth daily.) 180 capsule 3   No facility-administered medications prior to visit.    Allergies  Allergen Reactions   Milk (Cow) Other (See Comments)    Causes reflux if consumed in the pm.   Atorvastatin     REACTION: INTOL to Lipitor   Pollen Extract Other (See Comments)    Stuffy nose    ROS As per HPI  PE:    04/07/2022    8:35 AM 77/28/2023    8:32 AM 03/24/2022    8:06 AM  Vitals with BMI  Height '5\' 5"'$   '5\' 5"'$   Weight 210 lbs 6 oz  208 lbs 10 oz  BMI 58.09   98.33  Systolic 825 053 976  Diastolic 74 80 94  Pulse 74  73     Physical Exam  Gen: Alert, well appearing.  Patient is oriented to person, place, time, and situation. AFFECT: pleasant, lucid thought and speech. EXT: no edema No further exam today  LABS:  Last CBC Lab Results  Component Value Date   WBC 5.3 03/24/2022   HGB 17.7 (H) 03/24/2022   HCT 52.2 (H) 03/24/2022   MCV 92.5 03/24/2022   MCH 31.6 12/07/2015   RDW 14.3 03/24/2022  PLT 166.0 03/24/2022   Lab Results  Component Value Date   IRON 132 16/05/9603   Last metabolic panel Lab Results  Component Value Date   GLUCOSE 121 (H) 03/24/2022   NA 138 03/24/2022   K 4.4 03/24/2022   CL 100 03/24/2022   CO2 29 03/24/2022   BUN 20 03/24/2022   CREATININE 1.17 03/24/2022   CALCIUM 9.7 03/24/2022   PROT 7.5 03/24/2022   ALBUMIN 4.7 03/24/2022   BILITOT 1.2 03/24/2022   ALKPHOS 55 03/24/2022   AST 20 03/24/2022   ALT 21 03/24/2022   Last lipids Lab Results  Component Value Date   CHOL 220 (H) 03/24/2022   HDL 37.30 (L) 03/24/2022   LDLCALC 108 (H) 06/24/2018   LDLDIRECT 129.0 03/24/2022   TRIG 244.0 (H) 03/24/2022   CHOLHDL 6 03/24/2022   Last hemoglobin A1c Lab Results  Component Value Date   HGBA1C 6.2 03/24/2022   Last thyroid functions Lab Results  Component Value Date   TSH 0.60 12/01/2013   IMPRESSION AND PLAN:  #1 uncontrolled hypertension. Increase bisoprolol-HCTZ to 2 of the 5-6 0.25 tabs once a day. Continue valsartan 320 mg a day and amlodipine 10 mg a day Electrolytes and creatinine stable about 2 weeks ago.  #2 hypercholesterolemia. History of myalgias on atorvastatin.  Patient declines further trial of statin. I do not think he would qualify for PCSK9 inhibitor.  An After Visit Summary was printed and given to the patient.  FOLLOW UP: No follow-ups on file.  Signed:  Crissie Sickles, MD           04/07/2022

## 2022-04-10 DIAGNOSIS — I482 Chronic atrial fibrillation, unspecified: Secondary | ICD-10-CM | POA: Diagnosis not present

## 2022-04-10 DIAGNOSIS — Z7901 Long term (current) use of anticoagulants: Secondary | ICD-10-CM | POA: Diagnosis not present

## 2022-04-10 DIAGNOSIS — Z5181 Encounter for therapeutic drug level monitoring: Secondary | ICD-10-CM | POA: Diagnosis not present

## 2022-04-12 ENCOUNTER — Encounter: Payer: Self-pay | Admitting: Family Medicine

## 2022-05-02 ENCOUNTER — Ambulatory Visit (INDEPENDENT_AMBULATORY_CARE_PROVIDER_SITE_OTHER): Payer: PPO | Admitting: Family Medicine

## 2022-05-02 ENCOUNTER — Encounter: Payer: Self-pay | Admitting: Family Medicine

## 2022-05-02 VITALS — BP 125/68 | HR 56 | Temp 98.1°F | Ht 65.0 in | Wt 210.4 lb

## 2022-05-02 DIAGNOSIS — I10 Essential (primary) hypertension: Secondary | ICD-10-CM

## 2022-05-02 MED ORDER — BISOPROLOL-HYDROCHLOROTHIAZIDE 5-6.25 MG PO TABS
ORAL_TABLET | ORAL | 1 refills | Status: DC
Start: 2022-05-02 — End: 2022-05-05

## 2022-05-02 NOTE — Patient Instructions (Signed)
Increase ziac to THREE of the 5-6.'25mg'$  tabs every day

## 2022-05-02 NOTE — Progress Notes (Signed)
OFFICE VISIT  05/02/2022  CC:  Chief Complaint  Patient presents with   Hypertension   Hyperlipidemia   Patient is a 76 y.o. male who presents for 3-week follow-up uncontrolled hypertension and hyperlipidemia. A/P as of last visit: "#1 uncontrolled hypertension. Increase bisoprolol-HCTZ to 2 of the 5-6.25 tabs once a day. Continue valsartan 320 mg a day and amlodipine 10 mg a day Electrolytes and creatinine stable about 2 weeks ago.   #2 hypercholesterolemia. History of myalgias on atorvastatin.  Patient declines further trial of statin. I do not think he would qualify for PCSK9 inhibitor."  INTERIM HX:  He is feeling well. Home blood pressures averaging around 811 systolic and 91Y diastolic. Heart rate 65-70 typically.   Past Medical History:  Diagnosis Date   BPH (benign prostatic hyperplasia)    Cataract    Right; impairing vision as of 05/2017. (has ophthalmologist)   Chronic renal insufficiency, stage II (mild)    CrCl in the 60s   Coronary artery disease    GERD (gastroesophageal reflux disease)    Hearing loss    In a study at [facility] as of 2019   Hx of colonic polyps    03/2017 and 05/2021--adenomatous.  Recall 3 yrs. ([facility])   Hypercholesteremia    Pt refuses statins (as of 05/2016). Declined again 02/2019.   Hypertension    IBS (irritable bowel syndrome)    OSA on CPAP    Helene Kelp Day, NP at Neurology[facility]Sleep Medicine at [facility]   Peripheral vascular disease [facility])    Permanent atrial fibrillation ([place]) 2001   failed DCCV per pt.  Rate control + coumadin--monitored by cardiologist.   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  Colchicine started by UC   Polycythemia    Pt states he had w/u for hemachromatosis but it was NEG.  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   Prediabetes 06/22/2015   123.  A1c was 5.9%.  Stable at 6.0% 05/2016.  A1c 6.0% 11/2017. 5.9% 08/2019.   Prostate cancer  screening    Pt declines any further prostate ca screening as of 05/2015.   Reactive airway disease    Scoliosis    Testosterone deficiency    Tick bite    tick removed from scrotum 02/08/20 (Deer tick per urgent care provider)    Past Surgical History:  Procedure Laterality Date   aortic ultrasound  09/2019   AAA screening; prox aorta 3cm->ULN, no aneurism.   CARDIAC CATHETERIZATION  approx 2001   nonobstruc CAD   CIRCUMCISION     COLONOSCOPY W/ POLYPECTOMY  05/12/11; 04/27/17   2018 and 05/2021--adenomatous polyp; repeat 3 yrs   NASAL POLYP SURGERY     SKIN CANCER EXCISION     Non-malanoma   TRANSTHORACIC ECHOCARDIOGRAM  10/2019   EF 55-60%, nl LV syst fxn, severe LA and RA dilation, valves fine.   UPPER GI ENDOSCOPY  10/07/2021   normal except gastric polyps->Dr. Derrill Kay.  Pt was then changed from protonix to aciphex   [place]  03/2013   14/4, EEP=11    Outpatient Medications Prior to Visit  Medication Sig Dispense Refill   amLODipine (NORVASC) 10 MG tablet Take 1 tablet (10 mg total) by mouth daily. 90 tablet 1   Calcium Carbonate-Vitamin D 600-200 MG-UNIT TABS Take 1 tablet by mouth 2 (two) times daily.     diltiazem (CARDIZEM CD) 180  MG 24 hr capsule Take 1 capsule (180 mg total) by mouth 2 (two) times daily. (Patient taking differently: Take 180 mg by mouth daily.) 180 capsule 1   Glucosamine-Chondroit-Vit C-Mn (GLUCOSAMINE-CHONDROITIN MAX ST PO) Take 2 capsules by mouth daily.      Multiple Vitamins-Minerals (MENS MULTIVITAMIN PLUS PO) Take 1 tablet by mouth daily.     RABEprazole (ACIPHEX) 20 MG tablet Take 20 mg by mouth in the morning and at bedtime.     Testosterone 12.5 MG/ACT (1%) GEL apply 3 pumps onto THE SKIN three DAYS PER WEEK 150 g 5   valsartan (DIOVAN) 320 MG tablet Take 1 tablet (320 mg total) by mouth daily. 90 tablet 1   warfarin (COUMADIN) 2.5 MG tablet Take by mouth.     bisoprolol-hydrochlorothiazide (ZIAC) 5-6.25 MG tablet 2 tabs po qd  180 tablet 1   albuterol (VENTOLIN HFA) 108 (90 Base) MCG/ACT inhaler Inhale 2 puffs into the lungs every 4 (four) hours as needed for wheezing or shortness of breath. (Patient not taking: Reported on 05/02/2022) 1 each 0   No facility-administered medications prior to visit.    Allergies  Allergen Reactions   Milk (Cow) Other (See Comments)    Causes reflux if consumed in the pm.   Atorvastatin     REACTION: INTOL to Lipitor   Pollen Extract Other (See Comments)    Stuffy nose    ROS As per HPI  PE:    05/02/2022    8:00 AM 04/07/2022    8:35 AM 03/24/2022    8:32 AM  Vitals with BMI  Height '5\' 5"'$  '5\' 5"'$    Weight 210 lbs 6 oz 210 lbs 6 oz   BMI 79.39 03.00   Systolic 923 300 762  Diastolic 68 74 80  Pulse 56 74      Physical Exam  Gen: Alert, well appearing.  Patient is oriented to person, place, time, and situation. AFFECT: pleasant, lucid thought and speech. CV: irreg irreg, no murmur Chest is clear, no wheezing or rales. Normal symmetric air entry throughout both lung fields. No chest wall deformities or tenderness.  LABS:  Last metabolic panel Lab Results  Component Value Date   GLUCOSE 121 (H) 03/24/2022   NA 138 03/24/2022   K 4.4 03/24/2022   CL 100 03/24/2022   CO2 29 03/24/2022   BUN 20 03/24/2022   CREATININE 1.17 03/24/2022   CALCIUM 9.7 03/24/2022   PROT 7.5 03/24/2022   ALBUMIN 4.7 03/24/2022   BILITOT 1.2 03/24/2022   ALKPHOS 55 03/24/2022   AST 20 03/24/2022   ALT 21 03/24/2022   Lab Results  Component Value Date   CHOL 220 (H) 03/24/2022   HDL 37.30 (L) 03/24/2022   LDLCALC 108 (H) 06/24/2018   LDLDIRECT 129.0 03/24/2022   TRIG 244.0 (H) 03/24/2022   CHOLHDL 6 03/24/2022   IMPRESSION AND PLAN:  Uncontrolled hypertension. Improving. Increase bisoprolol-HCTZ 5-6.25 to 3 tabs daily (max bisop is '20mg'$ ) Continue amlodipine 10 mg a day and valsartan 320 mg a day. Basic metabolic panel at next follow-up in 1 month.  An After Visit  Summary was printed and given to the patient.  FOLLOW UP: Return in about 4 weeks (around 05/30/2022) for Follow-up hypertension.  Signed:  Crissie Sickles, MD           05/02/2022

## 2022-05-05 ENCOUNTER — Telehealth: Payer: Self-pay | Admitting: Family Medicine

## 2022-05-05 ENCOUNTER — Encounter: Payer: Self-pay | Admitting: Family Medicine

## 2022-05-05 MED ORDER — BISOPROLOL-HYDROCHLOROTHIAZIDE 5-6.25 MG PO TABS: ORAL_TABLET | ORAL | 1 refills | Status: DC

## 2022-05-05 NOTE — Telephone Encounter (Signed)
[facility] called to verify Jeffrey Little is now taking 3 tablets instead of two? If so the pharmacy needs a new script reflecting that.

## 2022-05-05 NOTE — Telephone Encounter (Signed)
Patient sent separate mychart message and response sent. Rx sent 9/5 for #270 with 1 refill. 90 day supply

## 2022-05-05 NOTE — Telephone Encounter (Signed)
Patient advised rx sent to the pharmacy for 90 day supply.

## 2022-05-30 ENCOUNTER — Ambulatory Visit (INDEPENDENT_AMBULATORY_CARE_PROVIDER_SITE_OTHER): Payer: PPO | Admitting: Family Medicine

## 2022-05-30 ENCOUNTER — Encounter: Payer: Self-pay | Admitting: Family Medicine

## 2022-05-30 VITALS — BP 128/70 | HR 59 | Temp 97.8°F | Ht 65.0 in | Wt 208.4 lb

## 2022-05-30 DIAGNOSIS — Z5181 Encounter for therapeutic drug level monitoring: Secondary | ICD-10-CM | POA: Diagnosis not present

## 2022-05-30 DIAGNOSIS — I10 Essential (primary) hypertension: Secondary | ICD-10-CM | POA: Diagnosis not present

## 2022-05-30 DIAGNOSIS — I482 Chronic atrial fibrillation, unspecified: Secondary | ICD-10-CM | POA: Diagnosis not present

## 2022-05-30 DIAGNOSIS — R001 Bradycardia, unspecified: Secondary | ICD-10-CM | POA: Diagnosis not present

## 2022-05-30 DIAGNOSIS — Z87891 Personal history of nicotine dependence: Secondary | ICD-10-CM | POA: Diagnosis not present

## 2022-05-30 DIAGNOSIS — G4731 Primary central sleep apnea: Secondary | ICD-10-CM | POA: Diagnosis not present

## 2022-05-30 DIAGNOSIS — Z23 Encounter for immunization: Secondary | ICD-10-CM | POA: Diagnosis not present

## 2022-05-30 DIAGNOSIS — Z7901 Long term (current) use of anticoagulants: Secondary | ICD-10-CM | POA: Diagnosis not present

## 2022-05-30 MED ORDER — BISOPROLOL-HYDROCHLOROTHIAZIDE 5-6.25 MG PO TABS: ORAL_TABLET | ORAL | 1 refills | Status: DC

## 2022-05-30 MED ORDER — HYDROCHLOROTHIAZIDE 12.5 MG PO CAPS: 12.5000 mg | ORAL_CAPSULE | Freq: Every day | ORAL | 1 refills | Status: DC

## 2022-05-30 MED ORDER — DILTIAZEM HCL ER COATED BEADS 180 MG PO CP24: 180.0000 mg | ORAL_CAPSULE | Freq: Every day | ORAL | 1 refills | Status: DC

## 2022-05-30 NOTE — Progress Notes (Signed)
OFFICE VISIT  05/30/2022  CC:  Chief Complaint  Patient presents with   Hypertension    Follow up; pt is fasting   Patient is a 76 y.o. male who presents for 1 month follow-up uncontrolled hypertension. .A/P as of last visit: "Uncontrolled hypertension. Improving. Increase bisoprolol-HCTZ 5-6.25 to 3 tabs daily (max bisop is '20mg'$ ) Continue amlodipine 10 mg a day and valsartan 320 mg a day. Basic metabolic panel at next follow-up in 1 month."  INTERIM HX:  Home bp's varying from 110-151 syst (avg 135) over 72-92 diast (avg 82), HR 50-81 (avg 65). He felt generalized fatigue when heart rate got down around 50-60.  He cut back on his bisoprolol-HCTZ to 2 tabs a day a few days ago and felt better. He is currently also taking amlodipine 10 mg a day, valsartan 320 mg a day, and diltiazem 180 mg a day.  ROS as above, plus--> no fevers, no CP, no SOB, no wheezing, no cough, no dizziness, no HAs, no rashes, no melena/hematochezia.  No polyuria or polydipsia.  No myalgias or arthralgias.  No focal weakness, paresthesias, or tremors.  No acute vision or hearing abnormalities.  No dysuria or unusual/new urinary urgency or frequency.  No recent changes in lower legs. No n/v/d or abd pain.  No palpitations.     Past Medical History:  Diagnosis Date   BPH (benign prostatic hyperplasia)    Cataract    Right; impairing vision as of 05/2017. (has ophthalmologist)   Chronic renal insufficiency, stage II (mild)    CrCl in the 60s   Coronary artery disease    GERD (gastroesophageal reflux disease)    Hearing loss    In a study at [facility] as of 2019   Hx of colonic polyps    03/2017 and 05/2021--adenomatous.  Recall 3 yrs. ([facility])   Hypercholesteremia    Pt refuses statins (as of 05/2016). Declined again 02/2019.   Hypertension    IBS (irritable bowel syndrome)    OSA on CPAP    Helene Kelp Day, NP at Neurology-Sleep Medicine at [facility]   Peripheral vascular disease [facility])    Permanent atrial  fibrillation ([place]) 2001   failed DCCV per pt.  Rate control + coumadin--monitored by cardiologist.   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  Colchicine started by UC   Polycythemia    Pt states he had w/u for hemachromatosis but it was NEG.  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   Prediabetes 06/22/2015   123.  A1c was 5.9%.  Stable at 6.0% 05/2016.  A1c 6.0% 11/2017. 5.9% 08/2019.   Prostate cancer screening    Pt declines any further prostate ca screening as of 05/2015.   Reactive airway disease    Scoliosis    Testosterone deficiency    Tick bite    tick removed from scrotum 02/08/20 (Deer tick per urgent care provider)    Past Surgical History:  Procedure Laterality Date   aortic ultrasound  09/2019   AAA screening; prox aorta 3cm->ULN, no aneurism.   CARDIAC CATHETERIZATION  approx 2001   nonobstruc CAD   CIRCUMCISION     COLONOSCOPY W/ POLYPECTOMY  05/12/11; 04/27/17   2018 and 05/2021--adenomatous polyp; repeat 3 yrs   NASAL POLYP SURGERY     SKIN CANCER EXCISION     Non-malanoma   TRANSTHORACIC ECHOCARDIOGRAM  10/2019   EF 55-60%, nl LV syst  fxn, severe LA and RA dilation, valves fine.   UPPER GI ENDOSCOPY  10/07/2021   normal except gastric polyps->Dr. Derrill Kay.  Pt was then changed from protonix to aciphex   [place]  03/2013   14/4, EEP=11    Outpatient Medications Prior to Visit  Medication Sig Dispense Refill   albuterol (VENTOLIN HFA) 108 (90 Base) MCG/ACT inhaler Inhale 2 puffs into the lungs every 4 (four) hours as needed for wheezing or shortness of breath. 1 each 0   amLODipine (NORVASC) 10 MG tablet Take 1 tablet (10 mg total) by mouth daily. 90 tablet 1   Calcium Carbonate-Vitamin D 600-200 MG-UNIT TABS Take 1 tablet by mouth 2 (two) times daily.     Glucosamine-Chondroit-Vit C-Mn (GLUCOSAMINE-CHONDROITIN MAX ST PO) Take 2 capsules by mouth daily.      Multiple  Vitamins-Minerals (MENS MULTIVITAMIN PLUS PO) Take 1 tablet by mouth daily.     Testosterone 12.5 MG/ACT (1%) GEL apply 3 pumps onto THE SKIN three DAYS PER WEEK 150 g 5   valsartan (DIOVAN) 320 MG tablet Take 1 tablet (320 mg total) by mouth daily. 90 tablet 1   warfarin (COUMADIN) 2.5 MG tablet Take by mouth.     bisoprolol-hydrochlorothiazide (ZIAC) 5-6.25 MG tablet 3 tabs po qd 270 tablet 1   diltiazem (CARDIZEM CD) 180 MG 24 hr capsule Take 1 capsule (180 mg total) by mouth 2 (two) times daily. (Patient taking differently: Take 180 mg by mouth daily.) 180 capsule 1   RABEprazole (ACIPHEX) 20 MG tablet Take 20 mg by mouth in the morning and at bedtime.     No facility-administered medications prior to visit.    Allergies  Allergen Reactions   Milk (Cow) Other (See Comments)    Causes reflux if consumed in the pm.   Atorvastatin     REACTION: INTOL to Lipitor   Pollen Extract Other (See Comments)    Stuffy nose    ROS As per HPI  PE:    05/30/2022    8:04 AM 05/02/2022    8:00 AM 04/07/2022    8:35 AM  Vitals with BMI  Height '5\' 5"'$  '5\' 5"'$  '5\' 5"'$   Weight 208 lbs 6 oz 210 lbs 6 oz 210 lbs 6 oz  BMI 34.68 82.50 03.70  Systolic 488 891 694  Diastolic 70 68 74  Pulse 59 56 74     Physical Exam  Gen: Alert, well appearing.  Patient is oriented to person, place, time, and situation. AFFECT: pleasant, lucid thought and speech. CV: Regular rhythm, heart rate 55-60, no m/r/g.   LUNGS: CTA bilat, nonlabored resps, good aeration in all lung fields.   LABS:  Last CBC Lab Results  Component Value Date   WBC 5.3 03/24/2022   HGB 17.7 (H) 03/24/2022   HCT 52.2 (H) 03/24/2022   MCV 92.5 03/24/2022   MCH 31.6 12/07/2015   RDW 14.3 03/24/2022   PLT 166.0 50/38/8828   Last metabolic panel Lab Results  Component Value Date   GLUCOSE 121 (H) 03/24/2022   NA 138 03/24/2022   K 4.4 03/24/2022   CL 100 03/24/2022   CO2 29 03/24/2022   BUN 20 03/24/2022   CREATININE 1.17  03/24/2022   CALCIUM 9.7 03/24/2022   PROT 7.5 03/24/2022   ALBUMIN 4.7 03/24/2022   BILITOT 1.2 03/24/2022   ALKPHOS 55 03/24/2022   AST 20 03/24/2022   ALT 21 03/24/2022      IMPRESSION AND PLAN:  #  1 uncontrolled hypertension. He has some symptomatic bradycardia with our increase of his bisoprolol-HCTZ to 3 of the 5-6.25 g tabs per day. Better since reverting back to 2 tabs a day. We will continue at this dosing for his bisoprolol-HCTZ and will continue valsartan 320 milligram daily and amlodipine 10 mg daily and diltiazem 180 mg daily. Add a separate HCTZ 12.5 mg capsule daily. The med in 1 week.  Follow-up in office to review blood pressures in 2 weeks.  An After Visit Summary was printed and given to the patient.  FOLLOW UP: Return in about 2 weeks (around 06/13/2022) for f/u HTN.  nonfasting bmet 1 wk--ordered.  Signed:  Crissie Sickles, MD           05/30/2022

## 2022-06-06 ENCOUNTER — Other Ambulatory Visit (INDEPENDENT_AMBULATORY_CARE_PROVIDER_SITE_OTHER): Payer: PPO

## 2022-06-06 DIAGNOSIS — I10 Essential (primary) hypertension: Secondary | ICD-10-CM | POA: Diagnosis not present

## 2022-06-06 LAB — BASIC METABOLIC PANEL
BUN: 18 mg/dL (ref 6–23)
CO2: 30 mEq/L (ref 19–32)
Calcium: 9.8 mg/dL (ref 8.4–10.5)
Chloride: 99 mEq/L (ref 96–112)
Creatinine, Ser: 1.2 mg/dL (ref 0.40–1.50)
GFR: 58.6 mL/min — ABNORMAL LOW (ref >60.00)
Glucose, Bld: 135 mg/dL — ABNORMAL HIGH (ref 70–99)
Potassium: 3.8 mEq/L (ref 3.5–5.1)
Sodium: 139 mEq/L (ref 135–145)

## 2022-06-15 ENCOUNTER — Encounter: Payer: Self-pay | Admitting: Family Medicine

## 2022-06-15 ENCOUNTER — Ambulatory Visit (INDEPENDENT_AMBULATORY_CARE_PROVIDER_SITE_OTHER): Payer: PPO | Admitting: Family Medicine

## 2022-06-15 VITALS — BP 116/73 | HR 57 | Temp 98.1°F | Ht 65.0 in | Wt 205.4 lb

## 2022-06-15 DIAGNOSIS — I10 Essential (primary) hypertension: Secondary | ICD-10-CM | POA: Diagnosis not present

## 2022-06-15 MED ORDER — RABEPRAZOLE SODIUM 20 MG PO TBEC: 20.0000 mg | DELAYED_RELEASE_TABLET | Freq: Two times a day (BID) | ORAL | 3 refills | Status: DC

## 2022-06-15 MED ORDER — HYDROCHLOROTHIAZIDE 12.5 MG PO CAPS
12.5000 mg | ORAL_CAPSULE | Freq: Every day | ORAL | 3 refills | Status: DC
Start: 2022-06-15 — End: 2022-07-13

## 2022-06-15 NOTE — Progress Notes (Signed)
OFFICE VISIT  06/15/2022  CC:  Chief Complaint  Patient presents with   Hypertension    Patient is a 76 y.o. male who presents for 2-week follow-up uncontrolled hypertension. A/P as of last visit: "#1 uncontrolled hypertension. He has some symptomatic bradycardia with our increase of his bisoprolol-HCTZ to 3 of the 5-6.25 g tabs per day. Better since reverting back to 2 tabs a day. We will continue at this dosing for his bisoprolol-HCTZ and will continue valsartan 320 milligram daily and amlodipine 10 mg daily and diltiazem 180 mg daily. Add a separate HCTZ 12.5 mg capsule daily. The med in 1 week.  Follow-up in office to review blood pressures in 2 weeks"  INTERIM HX: Electrolytes and creatinine were stable 1 week after last visit.  Reviewed home blood pressures today and his average systolic is 053 and average diastolic is 85. No side effects from medications.   Past Medical History:  Diagnosis Date   BPH (benign prostatic hyperplasia)    Cataract    Right; impairing vision as of 05/2017. (has ophthalmologist)   Chronic renal insufficiency, stage II (mild)    CrCl in the 60s   Coronary artery disease    GERD (gastroesophageal reflux disease)    Hearing loss    In a study at [facility] as of 2019   Hx of colonic polyps    03/2017 and 05/2021--adenomatous.  Recall 3 yrs. (Dig [facility] spec)   Hypercholesteremia    Pt refuses statins (as of 05/2016). Declined again 02/2019.   Hypertension    IBS (irritable bowel syndrome)    OSA on CPAP    Helene Kelp Day, NP at Neurology-Sleep Medicine at [facility]   Peripheral vascular disease [employer])    Permanent atrial fibrillation ([place]) 2001   failed DCCV per pt.  Rate control + coumadin--monitored by cardiologist.   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  Colchicine started by UC   Polycythemia    Pt states he had w/u for hemachromatosis but it was NEG.  As of 05/2016, his polycythemia is presumably due to  testosterone effect.   Prediabetes 06/22/2015   123.  A1c was 5.9%.  Stable at 6.0% 05/2016.  A1c 6.0% 11/2017. 5.9% 08/2019.   Prostate cancer screening    Pt declines any further prostate ca screening as of 05/2015.   Reactive airway disease    Scoliosis    Testosterone deficiency    Tick bite    tick removed from scrotum 02/08/20 (Deer tick per urgent care provider)    Past Surgical History:  Procedure Laterality Date   aortic ultrasound  09/2019   AAA screening; prox aorta 3cm->ULN, no aneurism.   CARDIAC CATHETERIZATION  approx 2001   nonobstruc CAD   CIRCUMCISION     COLONOSCOPY W/ POLYPECTOMY  05/12/11; 04/27/17   2018 and 05/2021--adenomatous polyp; repeat 3 yrs   NASAL POLYP SURGERY     SKIN CANCER EXCISION     Non-malanoma   TRANSTHORACIC ECHOCARDIOGRAM  10/2019   EF 55-60%, nl LV syst fxn, severe LA and RA dilation, valves fine.   UPPER GI ENDOSCOPY  10/07/2021   normal except gastric polyps->Dr. Derrill Kay.  Pt was then changed from protonix to aciphex   [place]  03/2013   14/4, EEP=11    Outpatient Medications Prior to Visit  Medication Sig Dispense Refill   albuterol (VENTOLIN HFA) 108 (90 Base) MCG/ACT inhaler Inhale 2 puffs  into the lungs every 4 (four) hours as needed for wheezing or shortness of breath. 1 each 0   amLODipine (NORVASC) 10 MG tablet Take 1 tablet (10 mg total) by mouth daily. 90 tablet 1   bisoprolol-hydrochlorothiazide (ZIAC) 5-6.25 MG tablet 2 tabs po qd 180 tablet 1   Calcium Carbonate-Vitamin D 600-200 MG-UNIT TABS Take 1 tablet by mouth 2 (two) times daily.     diltiazem (CARDIZEM CD) 180 MG 24 hr capsule Take 1 capsule (180 mg total) by mouth daily. 90 capsule 1   Glucosamine-Chondroit-Vit C-Mn (GLUCOSAMINE-CHONDROITIN MAX ST PO) Take 2 capsules by mouth daily.      Multiple Vitamins-Minerals (MENS MULTIVITAMIN PLUS PO) Take 1 tablet by mouth daily.     Testosterone 12.5 MG/ACT (1%) GEL apply 3 pumps onto THE SKIN three DAYS PER  WEEK 150 g 5   valsartan (DIOVAN) 320 MG tablet Take 1 tablet (320 mg total) by mouth daily. 90 tablet 1   warfarin (COUMADIN) 2.5 MG tablet Take by mouth.     hydrochlorothiazide (MICROZIDE) 12.5 MG capsule Take 1 capsule (12.5 mg total) by mouth daily. 30 capsule 1   RABEprazole (ACIPHEX) 20 MG tablet Take 20 mg by mouth in the morning and at bedtime.     No facility-administered medications prior to visit.    Allergies  Allergen Reactions   Milk (Cow) Other (See Comments)    Causes reflux if consumed in the pm.   Atorvastatin     REACTION: INTOL to Lipitor   Pollen Extract Other (See Comments)    Stuffy nose    ROS As per HPI  PE:    06/15/2022    9:03 AM 05/30/2022    8:04 AM 05/02/2022    8:00 AM  Vitals with BMI  Height '5\' 5"'$  '5\' 5"'$  '5\' 5"'$   Weight 205 lbs 6 oz 208 lbs 6 oz 210 lbs 6 oz  BMI 34.18 01.77 93.90  Systolic 300 923 300  Diastolic 73 70 68  Pulse 57 59 56     Physical Exam  Gen: Alert, well appearing.  Patient is oriented to person, place, time, and situation. Cardiovascular: Irregularly irregular, rate approximately 70.  S1 and S2 distant.  No audible murmur. Extremities :no edema  LABS:  Last metabolic panel Lab Results  Component Value Date   GLUCOSE 135 (H) 06/06/2022   NA 139 06/06/2022   K 3.8 06/06/2022   CL 99 06/06/2022   CO2 30 06/06/2022   BUN 18 06/06/2022   CREATININE 1.20 06/06/2022   CALCIUM 9.8 06/06/2022   PROT 7.5 03/24/2022   ALBUMIN 4.7 03/24/2022   BILITOT 1.2 03/24/2022   ALKPHOS 55 03/24/2022   AST 20 03/24/2022   ALT 21 03/24/2022   Lab Results  Component Value Date   HGBA1C 6.2 03/24/2022   IMPRESSION AND PLAN:  Essential hypertension, control improving. He is maxed out on amlodipine, bisoprolol, HCTZ, and valsartan. Additionally, he takes Cardizem CD1 180 mg for rate control of A-fib. Decided to not change anything today--he is in favor of no further medications and would like to get his cardiologist opinion  on this as well.  He has follow-up with his cardiologist next month.  An After Visit Summary was printed and given to the patient.  FOLLOW UP: Return in about 3 months (around 09/15/2022) for routine chronic illness f/u. Next CPE 02/2023  Signed:  Crissie Sickles, MD           06/15/2022

## 2022-06-26 ENCOUNTER — Other Ambulatory Visit: Payer: Self-pay | Admitting: Family Medicine

## 2022-07-07 DIAGNOSIS — Z5181 Encounter for therapeutic drug level monitoring: Secondary | ICD-10-CM | POA: Diagnosis not present

## 2022-07-07 DIAGNOSIS — I10 Essential (primary) hypertension: Secondary | ICD-10-CM | POA: Diagnosis not present

## 2022-07-07 DIAGNOSIS — R001 Bradycardia, unspecified: Secondary | ICD-10-CM | POA: Diagnosis not present

## 2022-07-07 DIAGNOSIS — Z7901 Long term (current) use of anticoagulants: Secondary | ICD-10-CM | POA: Diagnosis not present

## 2022-07-07 DIAGNOSIS — I482 Chronic atrial fibrillation, unspecified: Secondary | ICD-10-CM | POA: Diagnosis not present

## 2022-07-13 ENCOUNTER — Other Ambulatory Visit (HOSPITAL_COMMUNITY): Payer: Self-pay

## 2022-07-13 ENCOUNTER — Encounter: Payer: Self-pay | Admitting: Family Medicine

## 2022-07-13 ENCOUNTER — Other Ambulatory Visit: Payer: Self-pay

## 2022-07-13 MED ORDER — VALSARTAN 320 MG PO TABS
320.0000 mg | ORAL_TABLET | Freq: Every day | ORAL | 1 refills | Status: DC
  Filled 2022-07-13: qty 90, 90d supply, fill #0
  Filled 2022-09-30: qty 90, 90d supply, fill #1

## 2022-07-13 MED ORDER — AMLODIPINE BESYLATE 10 MG PO TABS
10.0000 mg | ORAL_TABLET | Freq: Every day | ORAL | 1 refills | Status: DC
  Filled 2022-07-13: qty 90, 90d supply, fill #0

## 2022-07-13 MED ORDER — BISOPROLOL-HYDROCHLOROTHIAZIDE 5-6.25 MG PO TABS
2.0000 | ORAL_TABLET | Freq: Every day | ORAL | 1 refills | Status: DC
  Filled 2022-07-13 – 2023-01-24 (×4): qty 180, 90d supply, fill #0

## 2022-07-13 MED ORDER — WARFARIN SODIUM 2.5 MG PO TABS
3.7500 mg | ORAL_TABLET | Freq: Every day | ORAL | 3 refills | Status: DC
  Filled 2022-07-13 – 2022-12-10 (×3): qty 135, 90d supply, fill #0
  Filled 2023-03-29: qty 135, 90d supply, fill #1
  Filled 2023-06-26: qty 135, 90d supply, fill #2

## 2022-07-13 MED ORDER — HYDROCHLOROTHIAZIDE 12.5 MG PO CAPS
12.5000 mg | ORAL_CAPSULE | Freq: Every day | ORAL | 1 refills | Status: DC
  Filled 2022-07-13 – 2022-07-24 (×3): qty 90, 90d supply, fill #0

## 2022-07-13 MED ORDER — DILTIAZEM HCL ER COATED BEADS 180 MG PO CP24
180.0000 mg | ORAL_CAPSULE | Freq: Every day | ORAL | 1 refills | Status: DC
  Filled 2022-07-13: qty 90, 90d supply, fill #0

## 2022-07-14 ENCOUNTER — Other Ambulatory Visit (HOSPITAL_COMMUNITY): Payer: Self-pay

## 2022-07-24 ENCOUNTER — Other Ambulatory Visit (HOSPITAL_COMMUNITY): Payer: Self-pay

## 2022-07-28 ENCOUNTER — Other Ambulatory Visit (HOSPITAL_COMMUNITY): Payer: Self-pay

## 2022-08-01 DIAGNOSIS — L570 Actinic keratosis: Secondary | ICD-10-CM | POA: Diagnosis not present

## 2022-08-01 DIAGNOSIS — Z85828 Personal history of other malignant neoplasm of skin: Secondary | ICD-10-CM | POA: Diagnosis not present

## 2022-08-01 DIAGNOSIS — L821 Other seborrheic keratosis: Secondary | ICD-10-CM | POA: Diagnosis not present

## 2022-08-01 DIAGNOSIS — L818 Other specified disorders of pigmentation: Secondary | ICD-10-CM | POA: Diagnosis not present

## 2022-08-14 DIAGNOSIS — Z7901 Long term (current) use of anticoagulants: Secondary | ICD-10-CM | POA: Diagnosis not present

## 2022-08-14 DIAGNOSIS — Z5181 Encounter for therapeutic drug level monitoring: Secondary | ICD-10-CM | POA: Diagnosis not present

## 2022-08-14 DIAGNOSIS — I482 Chronic atrial fibrillation, unspecified: Secondary | ICD-10-CM | POA: Diagnosis not present

## 2022-08-25 ENCOUNTER — Other Ambulatory Visit: Payer: Self-pay

## 2022-08-25 ENCOUNTER — Other Ambulatory Visit (HOSPITAL_COMMUNITY): Payer: Self-pay

## 2022-09-01 ENCOUNTER — Other Ambulatory Visit (HOSPITAL_COMMUNITY): Payer: Self-pay

## 2022-09-04 ENCOUNTER — Other Ambulatory Visit (HOSPITAL_COMMUNITY): Payer: Self-pay

## 2022-09-07 ENCOUNTER — Other Ambulatory Visit: Payer: Self-pay

## 2022-09-15 ENCOUNTER — Ambulatory Visit (INDEPENDENT_AMBULATORY_CARE_PROVIDER_SITE_OTHER): Payer: PPO | Admitting: Family Medicine

## 2022-09-15 ENCOUNTER — Other Ambulatory Visit (HOSPITAL_COMMUNITY): Payer: Self-pay

## 2022-09-15 ENCOUNTER — Encounter: Payer: Self-pay | Admitting: Family Medicine

## 2022-09-15 VITALS — BP 120/68 | HR 65 | Temp 98.1°F | Ht 65.0 in | Wt 208.8 lb

## 2022-09-15 DIAGNOSIS — R7989 Other specified abnormal findings of blood chemistry: Secondary | ICD-10-CM | POA: Diagnosis not present

## 2022-09-15 DIAGNOSIS — E7800 Pure hypercholesterolemia, unspecified: Secondary | ICD-10-CM

## 2022-09-15 DIAGNOSIS — R7303 Prediabetes: Secondary | ICD-10-CM

## 2022-09-15 DIAGNOSIS — I10 Essential (primary) hypertension: Secondary | ICD-10-CM

## 2022-09-15 LAB — COMPREHENSIVE METABOLIC PANEL
ALT: 18 U/L (ref 0–53)
AST: 18 U/L (ref 0–37)
Albumin: 4.5 g/dL (ref 3.5–5.2)
Alkaline Phosphatase: 54 U/L (ref 39–117)
BUN: 21 mg/dL (ref 6–23)
CO2: 32 mEq/L (ref 19–32)
Calcium: 9.3 mg/dL (ref 8.4–10.5)
Chloride: 98 mEq/L (ref 96–112)
Creatinine, Ser: 1.13 mg/dL (ref 0.40–1.50)
GFR: 62.86 mL/min (ref >60.00)
Glucose, Bld: 116 mg/dL — ABNORMAL HIGH (ref 70–99)
Potassium: 3.8 mEq/L (ref 3.5–5.1)
Sodium: 140 mEq/L (ref 135–145)
Total Bilirubin: 1.1 mg/dL (ref 0.2–1.2)
Total Protein: 7.3 g/dL (ref 6.0–8.3)

## 2022-09-15 LAB — CBC
HCT: 50.5 % (ref 39.0–52.0)
Hemoglobin: 17.2 g/dL — ABNORMAL HIGH (ref 13.0–17.0)
MCHC: 34 g/dL (ref 30.0–36.0)
MCV: 92.3 fl (ref 78.0–100.0)
Platelets: 176 10*3/uL (ref 150.0–400.0)
RBC: 5.47 Mil/uL (ref 4.22–5.81)
RDW: 14.1 % (ref 11.5–15.5)
WBC: 5.5 10*3/uL (ref 4.0–10.5)

## 2022-09-15 LAB — LIPID PANEL
Cholesterol: 174 mg/dL (ref 0–200)
HDL: 35.1 mg/dL — ABNORMAL LOW (ref >39.00)
LDL Cholesterol: 112 mg/dL — ABNORMAL HIGH (ref 0–99)
NonHDL: 139.3
Total CHOL/HDL Ratio: 5
Triglycerides: 137 mg/dL (ref 0.0–149.0)
VLDL: 27.4 mg/dL (ref 0.0–40.0)

## 2022-09-15 LAB — HEMOGLOBIN A1C: Hgb A1c MFr Bld: 6.2 % (ref 4.6–6.5)

## 2022-09-15 LAB — TESTOSTERONE: Testosterone: 296.46 ng/dL — ABNORMAL LOW (ref 300.00–890.00)

## 2022-09-15 MED ORDER — RABEPRAZOLE SODIUM 20 MG PO TBEC
20.0000 mg | DELAYED_RELEASE_TABLET | Freq: Two times a day (BID) | ORAL | 3 refills | Status: DC
  Filled 2022-09-15 – 2022-09-20 (×2): qty 200, 100d supply, fill #0
  Filled 2022-12-20: qty 200, 100d supply, fill #1
  Filled 2023-03-29: qty 200, 100d supply, fill #2
  Filled 2023-06-26: qty 120, 60d supply, fill #3

## 2022-09-15 MED ORDER — IPRATROPIUM BROMIDE 0.03 % NA SOLN
2.0000 | Freq: Two times a day (BID) | NASAL | 1 refills | Status: DC
  Filled 2022-09-15: qty 30, 43d supply, fill #0
  Filled 2022-12-20: qty 30, 43d supply, fill #1

## 2022-09-15 MED ORDER — FLUTICASONE PROPIONATE 50 MCG/ACT NA SUSP
2.0000 | Freq: Every day | NASAL | 6 refills | Status: DC
  Filled 2022-09-15: qty 16, 30d supply, fill #0
  Filled 2022-12-20: qty 16, 30d supply, fill #1

## 2022-09-15 MED ORDER — AMLODIPINE BESYLATE 10 MG PO TABS
10.0000 mg | ORAL_TABLET | Freq: Every day | ORAL | 1 refills | Status: DC
  Filled 2022-09-15 – 2022-09-30 (×2): qty 90, 90d supply, fill #0
  Filled 2023-01-03: qty 90, 90d supply, fill #1

## 2022-09-15 MED ORDER — HYDROCHLOROTHIAZIDE 12.5 MG PO CAPS: 12.5000 mg | ORAL_CAPSULE | Freq: Every day | ORAL | 1 refills | Status: DC

## 2022-09-15 MED ORDER — ALBUTEROL SULFATE HFA 108 (90 BASE) MCG/ACT IN AERS
2.0000 | INHALATION_SPRAY | RESPIRATORY_TRACT | 0 refills | Status: DC | PRN
  Filled 2022-09-15: qty 6.7, 16d supply, fill #0

## 2022-09-15 MED ORDER — HYDROCHLOROTHIAZIDE 12.5 MG PO CAPS
12.5000 mg | ORAL_CAPSULE | Freq: Every day | ORAL | 1 refills | Status: DC
  Filled 2022-09-15 – 2022-09-30 (×2): qty 90, 90d supply, fill #0
  Filled 2023-01-03: qty 90, 90d supply, fill #1

## 2022-09-15 MED ORDER — AMLODIPINE BESYLATE 10 MG PO TABS: 10.0000 mg | ORAL_TABLET | Freq: Every day | ORAL | 1 refills | Status: DC

## 2022-09-15 NOTE — Progress Notes (Signed)
OFFICE VISIT  09/15/2022  CC:  Chief Complaint  Patient presents with   Medical Management of Chronic Issues    Pt is fasting. Brought bp readings ranging from 07/10/22-09/13/22    Patient is a 77 y.o. male who presents for 83-monthfollow-up hypertension, hypercholesterolemia, hypogonadism, and prediabetes. A/P as of last visit: "Essential hypertension, control improving. He is maxed out on amlodipine, bisoprolol, HCTZ, and valsartan. Additionally, he takes Cardizem CD1 180 mg for rate control of A-fib. Decided to not change anything today--he is in favor of no further medications and would like to get his cardiologist opinion on this as well.  He has follow-up with his cardiologist next month."  INTERIM HX: Doing ok other than Getting over an episode of acute bronchitis. 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  HOME BP range 117-148 over 72-94 HR 48-84       Past Medical History:  Diagnosis Date   BPH (benign prostatic hyperplasia)    Cataract    Right; impairing vision as of 05/2017. (has ophthalmologist)   Chronic renal insufficiency, stage II (mild)    CrCl in the 60s   Coronary artery disease    GERD (gastroesophageal reflux disease)    Hearing loss    [facility]   Hx of colonic polyps    03/2017 and 05/2021--adenomatous.  Recall 3 yrs. (Dig [facility]a spec)   Hypercholesteremia    Pt refuses statins (as of 05/2016). Declined again 02/2019.   Hypertension    IBS (irritable bowel syndrome)    OSA on CPAP    [facility]elene [facility]Day[facility]  Peripheral vascular disease ([facility]    [place] 2001   failed DCCV per pt.  Rate control + coumadin--monitored by cardiologist.   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  Colchicine started by UC   Polycythemia    Pt states he had w/u for hemachromatosis but it was NEG.  As of 05/2016, his polycythemia is presumably  due to testosterone effect.   Prediabetes 06/22/2015   123.  A1c was 5.9%.  Stable at 6.0% 05/2016.  A1c 6.0% 11/2017. 5.9% 08/2019.   Prostate cancer screening    Pt declines any further prostate ca screening as of 05/2015.   Reactive airway disease    Scoliosis    Testosterone deficiency    Tick bite    tick removed from scrotum 02/08/20 (Deer tick per urgent care provider)    Past Surgical History:  Procedure Laterality Date   aortic ultrasound  09/2019   AAA screening; prox aorta 3cm->ULN, no aneurism.   CARDIAC CATHETERIZATION  approx 2001   nonobstruc CAD   CIRCUMCISION     COLONOSCOPY W/ POLYPECTOMY  05/12/11; 04/27/17   2018 and 05/2021--adenomatous polyp; repeat 3 yrs   NASAL POLYP SURGERY     SKIN CANCER EXCISION     Non-malanoma   TRANSTHORACIC ECHOCARDIOGRAM  10/2019   EF 55-60%, nl LV syst fxn, severe LA and RA dilation, valves fine.   UPPER GI ENDOSCOPY  10/07/2021   normal except gastric polyps->Dr. KDerrill Kay  Pt was then changed from protonix to aciphex   [place]   14/4, EEP=11    Outpatient Medications Prior to Visit  Medication Sig Dispense Refill   bisoprolol-hydrochlorothiazide (ZIAC) 5-6.25 MG tablet Take 2 tablets by mouth daily. 180 tablet 1  Calcium Carbonate-Vitamin D 600-200 MG-UNIT TABS Take 1 tablet by mouth 2 (two) times daily.     diltiazem (CARDIZEM CD) 180 MG 24 hr capsule Take 1 capsule (180 mg total) by mouth daily. 90 capsule 1   Glucosamine-Chondroit-Vit C-Mn (GLUCOSAMINE-CHONDROITIN MAX ST PO) Take 2 capsules by mouth daily.      Multiple Vitamins-Minerals (MENS MULTIVITAMIN PLUS PO) Take 1 tablet by mouth daily.     Testosterone 12.5 MG/ACT (1%) GEL apply 3 pumps onto THE SKIN three DAYS PER WEEK 150 g 5   valsartan (DIOVAN) 320 MG tablet Take 1 tablet (320 mg total) by mouth daily. 90 tablet 1   warfarin (COUMADIN) 2.5 MG tablet Take by mouth.     warfarin (COUMADIN) 2.5 MG tablet Take 1.5 tablets (3.75 mg total) by  mouth daily. 135 tablet 3   albuterol (VENTOLIN HFA) 108 (90 Base) MCG/ACT inhaler Inhale 2 puffs into the lungs every 4 (four) hours as needed for wheezing or shortness of breath. 1 each 0   amLODipine (NORVASC) 10 MG tablet Take 1 tablet (10 mg total) by mouth daily. 90 tablet 1   hydrochlorothiazide (MICROZIDE) 12.5 MG capsule Take 1 capsule (12.5 mg total) by mouth daily. 90 capsule 1   RABEprazole (ACIPHEX) 20 MG tablet Take 1 tablet (20 mg total) by mouth in the morning and at bedtime. 180 tablet 3   No facility-administered medications prior to visit.    Allergies  Allergen Reactions   Milk (Cow) Other (See Comments)    Causes reflux if consumed in the pm.   Atorvastatin     REACTION: INTOL to Lipitor   Pollen Extract Other (See Comments)    Stuffy nose    Review of Systems As per HPI  PE:    09/15/2022    8:09 AM 06/15/2022    9:03 AM 05/30/2022    8:04 AM  Vitals with BMI  Height '5\' 5"'$  '5\' 5"'$  '5\' 5"'$   Weight 208 lbs 13 oz 205 lbs 6 oz 208 lbs 6 oz  BMI 34.75 02.63 78.58  Systolic 850 277 412  Diastolic 68 73 70  Pulse 65 57 59   Physical Exam  Gen: Alert, well appearing.  Patient is oriented to person, place, time, and situation. AFFECT: pleasant, lucid thought and speech. CV: Irregular ,rate approximately 60., distant S1/S2, no m/r/g.   LUNGS: CTA bilat, nonlabored resps, good aeration in all lung fields.   LABS:  Last CBC Lab Results  Component Value Date   WBC 5.3 03/24/2022   HGB 17.7 (H) 03/24/2022   HCT 52.2 (H) 03/24/2022   MCV 92.5 03/24/2022   MCH 31.6 12/07/2015   RDW 14.3 03/24/2022   PLT 166.0 87/86/7672   Last metabolic panel Lab Results  Component Value Date   GLUCOSE 135 (H) 06/06/2022   NA 139 06/06/2022   K 3.8 06/06/2022   CL 99 06/06/2022   CO2 30 06/06/2022   BUN 18 06/06/2022   CREATININE 1.20 06/06/2022   CALCIUM 9.8 06/06/2022   PROT 7.5 03/24/2022   ALBUMIN 4.7 03/24/2022   BILITOT 1.2 03/24/2022   ALKPHOS 55 03/24/2022    AST 20 03/24/2022   ALT 21 03/24/2022   Last lipids Lab Results  Component Value Date   CHOL 220 (H) 03/24/2022   HDL 37.30 (L) 03/24/2022   LDLCALC 108 (H) 06/24/2018   LDLDIRECT 129.0 03/24/2022   TRIG 244.0 (H) 03/24/2022   CHOLHDL 6 03/24/2022   Last hemoglobin A1c Lab Results  Component Value Date   HGBA1C 6.2 03/24/2022   Lab Results  Component Value Date   TESTOSTERONE 323.16 08/31/2021   IMPRESSION AND PLAN:  #1 hypertension, good control. He is maxed out on amlodipine, bisoprolol, HCTZ, and valsartan. Additionally, he takes Cardizem CD1 180 mg for rate control of A-fib. No changes today.  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  Doing well on testosterone gel 12.5 mg per actuation, 3 pumps 3 days a week. Testosterone and CBC today.  3.  Prediabetes. Hemoglobin A1c checked today.  4.  Hypercholesterolemia. Patient has repeatedly refused statin. Lipid panel and hepatic panel today.  An After Visit Summary was printed and given to the patient.  FOLLOW UP: Return in about 6 months (around 03/16/2023) for annual CPE (fasting). Next CPE 02/2023.  Signed:  Crissie Sickles, MD           09/15/2022

## 2022-09-18 ENCOUNTER — Other Ambulatory Visit (HOSPITAL_COMMUNITY): Payer: Self-pay

## 2022-09-19 ENCOUNTER — Encounter: Payer: Self-pay | Admitting: Pharmacist

## 2022-09-19 ENCOUNTER — Other Ambulatory Visit (HOSPITAL_COMMUNITY): Payer: Self-pay

## 2022-09-19 ENCOUNTER — Other Ambulatory Visit: Payer: Self-pay

## 2022-09-20 ENCOUNTER — Other Ambulatory Visit (HOSPITAL_COMMUNITY): Payer: Self-pay

## 2022-09-22 ENCOUNTER — Other Ambulatory Visit (HOSPITAL_COMMUNITY): Payer: Self-pay

## 2022-09-25 DIAGNOSIS — Z7901 Long term (current) use of anticoagulants: Secondary | ICD-10-CM | POA: Diagnosis not present

## 2022-09-25 DIAGNOSIS — I482 Chronic atrial fibrillation, unspecified: Secondary | ICD-10-CM | POA: Diagnosis not present

## 2022-10-02 ENCOUNTER — Other Ambulatory Visit (HOSPITAL_COMMUNITY): Payer: Self-pay

## 2022-10-09 DIAGNOSIS — Z961 Presence of intraocular lens: Secondary | ICD-10-CM | POA: Diagnosis not present

## 2022-10-27 ENCOUNTER — Other Ambulatory Visit: Payer: Self-pay

## 2022-10-27 ENCOUNTER — Other Ambulatory Visit (HOSPITAL_COMMUNITY): Payer: Self-pay

## 2022-10-30 ENCOUNTER — Other Ambulatory Visit: Payer: Self-pay

## 2022-11-02 ENCOUNTER — Other Ambulatory Visit (HOSPITAL_COMMUNITY): Payer: Self-pay

## 2022-11-02 ENCOUNTER — Other Ambulatory Visit: Payer: Self-pay

## 2022-11-06 ENCOUNTER — Other Ambulatory Visit (HOSPITAL_COMMUNITY): Payer: Self-pay

## 2022-11-06 DIAGNOSIS — Z7901 Long term (current) use of anticoagulants: Secondary | ICD-10-CM | POA: Diagnosis not present

## 2022-11-06 DIAGNOSIS — Z5181 Encounter for therapeutic drug level monitoring: Secondary | ICD-10-CM | POA: Diagnosis not present

## 2022-11-06 DIAGNOSIS — I482 Chronic atrial fibrillation, unspecified: Secondary | ICD-10-CM | POA: Diagnosis not present

## 2022-11-07 ENCOUNTER — Other Ambulatory Visit (HOSPITAL_COMMUNITY): Payer: Self-pay

## 2022-11-07 ENCOUNTER — Other Ambulatory Visit: Payer: Self-pay | Admitting: Family Medicine

## 2022-11-07 ENCOUNTER — Other Ambulatory Visit: Payer: Self-pay

## 2022-11-08 ENCOUNTER — Other Ambulatory Visit (HOSPITAL_COMMUNITY): Payer: Self-pay

## 2022-11-08 ENCOUNTER — Other Ambulatory Visit: Payer: Self-pay

## 2022-11-08 MED ORDER — ALBUTEROL SULFATE HFA 108 (90 BASE) MCG/ACT IN AERS
2.0000 | INHALATION_SPRAY | RESPIRATORY_TRACT | 0 refills | Status: DC | PRN
  Filled 2022-11-08: qty 6.7, 16d supply, fill #0

## 2022-11-15 ENCOUNTER — Other Ambulatory Visit (HOSPITAL_COMMUNITY): Payer: Self-pay

## 2022-11-16 ENCOUNTER — Other Ambulatory Visit: Payer: Self-pay

## 2022-11-17 ENCOUNTER — Other Ambulatory Visit: Payer: Self-pay

## 2022-11-20 ENCOUNTER — Other Ambulatory Visit: Payer: Self-pay

## 2022-11-22 ENCOUNTER — Other Ambulatory Visit: Payer: Self-pay

## 2022-12-07 DIAGNOSIS — I482 Chronic atrial fibrillation, unspecified: Secondary | ICD-10-CM | POA: Diagnosis not present

## 2022-12-07 DIAGNOSIS — Z7901 Long term (current) use of anticoagulants: Secondary | ICD-10-CM | POA: Diagnosis not present

## 2022-12-10 ENCOUNTER — Other Ambulatory Visit (HOSPITAL_COMMUNITY): Payer: Self-pay

## 2022-12-11 ENCOUNTER — Other Ambulatory Visit: Payer: Self-pay

## 2022-12-20 ENCOUNTER — Other Ambulatory Visit: Payer: Self-pay | Admitting: Family Medicine

## 2022-12-21 ENCOUNTER — Other Ambulatory Visit (HOSPITAL_COMMUNITY): Payer: Self-pay

## 2022-12-21 MED ORDER — ALBUTEROL SULFATE HFA 108 (90 BASE) MCG/ACT IN AERS
2.0000 | INHALATION_SPRAY | RESPIRATORY_TRACT | 0 refills | Status: DC | PRN
  Filled 2022-12-21: qty 6.7, 16d supply, fill #0

## 2023-01-03 ENCOUNTER — Other Ambulatory Visit: Payer: Self-pay | Admitting: Family Medicine

## 2023-01-03 ENCOUNTER — Other Ambulatory Visit (HOSPITAL_COMMUNITY): Payer: Self-pay

## 2023-01-03 MED ORDER — VALSARTAN 320 MG PO TABS
320.0000 mg | ORAL_TABLET | Freq: Every day | ORAL | 1 refills | Status: DC
  Filled 2023-01-03: qty 90, 90d supply, fill #0
  Filled 2023-03-29: qty 90, 90d supply, fill #1

## 2023-01-11 DIAGNOSIS — I482 Chronic atrial fibrillation, unspecified: Secondary | ICD-10-CM | POA: Diagnosis not present

## 2023-01-11 DIAGNOSIS — Z7901 Long term (current) use of anticoagulants: Secondary | ICD-10-CM | POA: Diagnosis not present

## 2023-01-11 DIAGNOSIS — Z5181 Encounter for therapeutic drug level monitoring: Secondary | ICD-10-CM | POA: Diagnosis not present

## 2023-01-25 ENCOUNTER — Other Ambulatory Visit (HOSPITAL_COMMUNITY): Payer: Self-pay

## 2023-01-25 ENCOUNTER — Other Ambulatory Visit: Payer: Self-pay

## 2023-01-31 ENCOUNTER — Ambulatory Visit (INDEPENDENT_AMBULATORY_CARE_PROVIDER_SITE_OTHER): Payer: PPO

## 2023-01-31 VITALS — Wt 208.0 lb

## 2023-01-31 DIAGNOSIS — Z0000 Encounter for general adult medical examination without abnormal findings: Secondary | ICD-10-CM

## 2023-01-31 NOTE — Progress Notes (Signed)
I connected with  Jeffrey Little on 01/31/23 by a audio enabled telemedicine application and verified that I am speaking with the correct person using two identifiers.  Patient Location: Home  Provider Location: Home Office  I discussed the limitations of evaluation and management by telemedicine. The patient expressed understanding and agreed to proceed.   Subjective:   Jeffrey Little is a 77 y.o. male who presents for Medicare Annual/Subsequent preventive examination.  Review of Systems     Cardiac Risk Factors include: advanced age (>36men, >86 women);hypertension;dyslipidemia;male gender;obesity (BMI >30kg/m2)     Objective:    Today's Vitals   01/31/23 0804  Weight: 208 lb (94.3 kg)   Body mass index is 34.61 kg/m.     01/31/2023    8:09 AM 01/25/2022    8:09 AM  Advanced Directives  Does Patient Have a Medical Advance Directive? Yes Yes  Type of Estate agent of [place];Living will Healthcare Power of Attorney  Copy of Healthcare Power of Attorney in Chart? No - copy requested No - copy requested    Current Medications (verified) Outpatient Encounter Medications as of 01/31/2023  Medication Sig   albuterol (VENTOLIN HFA) 108 (90 Base) MCG/ACT inhaler Inhale 2 puffs into the lungs every 4 (four) hours as needed for wheezing or shortness of breath.   amLODipine (NORVASC) 10 MG tablet Take 1 tablet (10 mg total) by mouth daily.   bisoprolol-hydrochlorothiazide (ZIAC) 5-6.25 MG tablet Take 2 tablets by mouth daily.   Calcium Carbonate-Vitamin D 600-200 MG-UNIT TABS Take 1 tablet by mouth 2 (two) times daily.   diltiazem (CARDIZEM CD) 180 MG 24 hr capsule Take 1 capsule (180 mg total) by mouth daily.   fluticasone (FLONASE) 50 MCG/ACT nasal spray Place 2 sprays into both nostrils daily.   Glucosamine-Chondroit-Vit C-Mn (GLUCOSAMINE-CHONDROITIN MAX ST PO) Take 2 capsules by mouth daily.    hydrochlorothiazide (MICROZIDE) 12.5 MG capsule Take 1 capsule  (12.5 mg total) by mouth daily.   ipratropium (ATROVENT) 0.03 % nasal spray Place 2 sprays into both nostrils every 12 (twelve) hours for 3 days as needed for runny nose   Multiple Vitamins-Minerals (MENS MULTIVITAMIN PLUS PO) Take 1 tablet by mouth daily.   RABEprazole (ACIPHEX) 20 MG tablet Take 1 tablet (20 mg total) by mouth in the morning and at bedtime.   Testosterone 12.5 MG/ACT (1%) GEL apply 3 pumps onto THE SKIN three DAYS PER WEEK   valsartan (DIOVAN) 320 MG tablet Take 1 tablet (320 mg total) by mouth daily.   warfarin (COUMADIN) 2.5 MG tablet Take up to 1.5 tablets (3.75 mg total) by mouth daily or as directed.   warfarin (COUMADIN) 2.5 MG tablet Take by mouth.   No facility-administered encounter medications on file as of 01/31/2023.    Allergies (verified) Milk (cow), Atorvastatin, and Pollen extract   History: Past Medical History:  Diagnosis Date   BPH (benign prostatic hyperplasia)    Cataract    Right; impairing vision as of 05/2017. (has ophthalmologist)   Chronic renal insufficiency, stage II (mild)    CrCl in the 60s   Coronary artery disease    GERD (gastroesophageal reflux disease)    Hearing loss    In a study at [facility] as of 2019   Hx of colonic polyps    03/2017 and 05/2021--adenomatous.  Recall 3 yrs. (Dig [facility] spec)   Hypercholesteremia    Pt refuses statins (as of 05/2016). Declined again 02/2019.   Hypertension    IBS (  irritable bowel syndrome)    OSA on CPAP    Jeffrey Bath Day, NP at Neurology-Sleep Medicine at [facility]   Peripheral vascular disease [facility])    Permanent atrial fibrillation (HCC) 2001   failed DCCV per pt.  Rate control + coumadin--monitored by cardiologist.   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  Colchicine started by UC   Polycythemia    Pt states he had w/u for hemachromatosis but it was NEG.  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   Prediabetes 06/22/2015    123.  A1c was 5.9%.  Stable at 6.0% 05/2016.  A1c 6.0% 11/2017. 5.9% 08/2019.   Prostate cancer screening    Pt declines any further prostate ca screening as of 05/2015.   Reactive airway disease    Scoliosis    Testosterone deficiency    Tick bite    tick removed from scrotum 02/08/20 (Deer tick per urgent care provider)   Past Surgical History:  Procedure Laterality Date   aortic ultrasound  09/2019   AAA screening; prox aorta 3cm->ULN, no aneurism.   CARDIAC CATHETERIZATION  approx 2001   nonobstruc CAD   CIRCUMCISION     COLONOSCOPY W/ POLYPECTOMY  05/12/11; 04/27/17   2018 and 05/2021--adenomatous polyp; repeat 3 yrs   NASAL POLYP SURGERY     SKIN CANCER EXCISION     Non-malanoma   TRANSTHORACIC ECHOCARDIOGRAM  10/2019   EF 55-60%, nl LV syst fxn, severe LA and RA dilation, valves fine.   UPPER GI ENDOSCOPY  10/07/2021   normal except gastric polyps->Dr. Alycia Rossetti.  Pt was then changed from protonix to aciphex   VASECTOMY  1983   VPAP  03/2013   14/4, EEP=11   History reviewed. No pertinent family history. Social History   Socioeconomic History   Marital status: Married    Spouse name: Not on file   Number of children: 2   Years of education: Not on file   Highest education level: Not on file  Occupational History   Occupation: retired  Tobacco Use   Smoking status: Former    Types: Cigars    Quit date: 08/28/1974    Years since quitting: 48.4   Smokeless tobacco: Never  Substance and Sexual Activity   Alcohol use: No   Drug use: No   Sexual activity: Not on file  Other Topics Concern   Not on file  Social History Narrative   Married, 2 sons, 4 grandchildren.   Orig from [place].   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   No cigs, no alcohol, no drugs.   No exercise.   Social Determinants of Health   Financial Resource Strain: Low Risk  (01/31/2023)   Overall Financial Resource Strain (CARDIA)    Difficulty of Paying  Living Expenses: Not hard at all  Food Insecurity: No Food Insecurity (01/31/2023)   Hunger Vital Sign    Worried About Running Out of Food in the Last Year: Never true    Ran Out of Food in the Last Year: Never true  Transportation Needs: No Transportation Needs (01/31/2023)   PRAPARE - Administrator, Civil Service (Medical): No    Lack of Transportation (Non-Medical): No  Physical Activity: Inactive (01/31/2023)   Exercise Vital Sign    Days of Exercise per Week: 0 days    Minutes of Exercise per Session: 0 min  Stress:  No Stress Concern Present (01/31/2023)   [employer] of Occupational Health - Occupational Stress Questionnaire    Feeling of Stress : Not at all  Social Connections: Socially Integrated (01/31/2023)   Social Connection and Isolation Panel [NHANES]    Frequency of Communication with Friends and Family: More than three times a week    Frequency of Social Gatherings with Friends and Family: More than three times a week    Attends Religious Services: More than 4 times per year    Active Member of [employer] or Organizations: Yes    Attends Banker Meetings: 1 to 4 times per year    Marital Status: Married    Tobacco Counseling Counseling given: Not Answered   Clinical Intake:  Pre-visit preparation completed: Yes  Pain : No/denies pain     BMI - recorded: 34.61 Nutritional Status: BMI > 30  Obese Nutritional Risks: None Diabetes: No  How often do you need to have someone help you when you read instructions, pamphlets, or other written materials from your doctor or pharmacy?: 1 - Never  Diabetic?no  Interpreter Needed?: No  Information entered by :: Lanier Ensign, LPN   Activities of Daily Living    01/31/2023    8:10 AM  In your present state of health, do you have any difficulty performing the following activities:  Hearing? 1  Comment wears hearing aids  Vision? 0  Difficulty concentrating or making decisions? 0  Walking or  climbing stairs? 0  Dressing or bathing? 0  Doing errands, shopping? 0  Preparing Food and eating ? N  Using the Toilet? N  In the past six months, have you accidently leaked urine? N  Do you have problems with loss of bowel control? N  Managing your Medications? N  Managing your Finances? N  Housekeeping or managing your Housekeeping? N    Patient Care Team: Jeoffrey Massed, MD as PCP - General (Family Medicine) Truitt Merle, MD as Consulting Physician (Gastroenterology) Emelda Fear, MD as Consulting Physician (Dermatology) Garen Grams, MD as Consulting Physician (Cardiology) Beverly Gust, MD as Consulting Physician (Gastroenterology)  Indicate any recent Medical Services you may have received from other than [facility] providers in the past year (date may be approximate).     Assessment:   This is a routine wellness examination for [place].  Hearing/Vision screen Hearing Screening - Comments:: Pt wears hearing aids  Vision Screening - Comments:: Pt follows up with Dr Suezanne Jacquet for annual eye exams   Dietary issues and exercise activities discussed: Current Exercise Habits: The patient has a physically strenuous job, but has no regular exercise apart from work.   Goals Addressed             This Visit's Progress    Patient Stated       Stay healthy        Depression Screen    01/31/2023    8:08 AM 09/15/2022    8:16 AM 06/15/2022    9:11 AM 01/25/2022    8:08 AM 08/31/2021    1:05 PM 01/19/2021    2:20 PM 01/12/2021    1:05 PM  PHQ 2/9 Scores  PHQ - 2 Score 0 3 0 0 0 0 0  PHQ- 9 Score  5         Fall Risk    01/31/2023    8:10 AM 06/15/2022    9:11 AM 01/25/2022    8:10 AM 08/31/2021    1:05 PM 01/19/2021  2:19 PM  Fall Risk   Falls in the past year? 0 0 0 0 0  Number falls in past yr: 0 0 0 0 0  Injury with Fall? 0 0 0 0 0  Risk for fall due to : Impaired vision No Fall Risks     Follow up Falls prevention discussed Falls evaluation completed  Falls prevention discussed Falls evaluation completed Falls prevention discussed    FALL RISK PREVENTION PERTAINING TO THE HOME:  Any stairs in or around the home? Yes  If so, are there any without handrails? No  Home free of loose throw rugs in walkways, pet beds, electrical cords, etc? Yes  Adequate lighting in your home to reduce risk of falls? Yes   ASSISTIVE DEVICES UTILIZED TO PREVENT FALLS:  Life alert? No  Use of a cane, walker or w/c? No  Grab bars in the bathroom? Yes  Shower chair or bench in shower? Yes  Elevated toilet seat or a handicapped toilet? No   TIMED UP AND GO:  Was the test performed? No .   Cognitive Function:        01/31/2023    8:11 AM 01/25/2022    8:11 AM  6CIT Screen  What Year? 0 points 0 points  What month? 0 points 0 points  What time? 0 points 0 points  Count back from 20 0 points 0 points  Months in reverse 0 points 0 points  Repeat phrase 0 points 0 points  Total Score 0 points 0 points    Immunizations Immunization History  Administered Date(s) Administered   Fluad Quad(high Dose 65+) 07/05/2021, 05/30/2022   Influenza Split 07/29/2011, 06/27/2012   Influenza Whole 08/11/2010   Influenza, High Dose Seasonal PF 06/22/2015, 06/21/2016, 06/22/2017, 06/18/2018, 05/26/2019, 06/03/2020   Influenza,inj,Quad PF,6+ Mos 06/02/2014   Influenza-Unspecified 05/26/2019, 06/03/2020   Moderna Sars-Covid-2 Vaccination 09/29/2019, 10/28/2019, 07/30/2020, 12/08/2020   Pneumococcal Conjugate-13 06/02/2014   Pneumococcal Polysaccharide-23 08/11/2010, 06/22/2017   Tdap 09/15/2011, 11/04/2021    TDAP status: Up to date  Flu Vaccine status: Up to date  Pneumococcal vaccine status: Up to date  Covid-19 vaccine status: Completed vaccines  Qualifies for Shingles Vaccine? Yes   Zostavax completed No   Shingrix Completed?: No.    Education has been provided regarding the importance of this vaccine. Patient has been advised to call insurance company  to determine out of pocket expense if they have not yet received this vaccine. Advised may also receive vaccine at local pharmacy or Health Dept. Verbalized acceptance and understanding.  Screening Tests Health Maintenance  Topic Date Due   Hepatitis C Screening  Never done   Zoster Vaccines- Shingrix (1 of 2) Never done   COVID-19 Vaccine (5 - 2023-24 season) 04/28/2022   INFLUENZA VACCINE  03/29/2023   Medicare Annual Wellness (AWV)  01/31/2024   Colonoscopy  06/13/2024   DTaP/Tdap/Td (3 - Td or Tdap) 11/05/2031   Pneumonia Vaccine 32+ Years old  Completed   HPV VACCINES  Aged Out    Health Maintenance  Health Maintenance Due  Topic Date Due   Hepatitis C Screening  Never done   Zoster Vaccines- Shingrix (1 of 2) Never done   COVID-19 Vaccine (5 - 2023-24 season) 04/28/2022    Colorectal cancer screening: Type of screening: Colonoscopy. Completed 06/03/21. Repeat every 3 years   Additional Screening:  Hepatitis C Screening: does qualify  Vision Screening: Recommended annual ophthalmology exams for early detection of glaucoma and other disorders of the eye. Is  the patient up to date with their annual eye exam?  Yes  Who is the provider or what is the name of the office in which the patient attends annual eye exams? Dr Suezanne Jacquet  If pt is not established with a provider, would they like to be referred to a provider to establish care? No .   Dental Screening: Recommended annual dental exams for proper oral hygiene  Community Resource Referral / Chronic Care Management: CRR required this visit?  No   CCM required this visit?  No      Plan:     I have personally reviewed and noted the following in the patient's chart:   Medical and social history Use of alcohol, tobacco or illicit drugs  Current medications and supplements including opioid prescriptions. Patient is not currently taking opioid prescriptions. Functional ability and status Nutritional status Physical  activity Advanced directives List of other physicians Hospitalizations, surgeries, and ER visits in previous 12 months Vitals Screenings to include cognitive, depression, and falls Referrals and appointments  In addition, I have reviewed and discussed with patient certain preventive protocols, quality metrics, and best practice recommendations. A written personalized care plan for preventive services as well as general preventive health recommendations were provided to patient.     Marzella Schlein, LPN   09/02/1094   Nurse Notes: none

## 2023-01-31 NOTE — Patient Instructions (Signed)
Jeffrey Little , Thank you for taking time to come for your Medicare Wellness Visit. I appreciate your ongoing commitment to your health goals. Please review the following plan we discussed and let me know if I can assist you in the future.   These are the goals we discussed:  Goals      Patient Stated     Eat healthier & increase activity     Patient Stated     Lose weight      Patient Stated     Stay healthy         This is a list of the screening recommended for you and due dates:  Health Maintenance  Topic Date Due   Hepatitis C Screening  Never done   Zoster (Shingles) Vaccine (1 of 2) Never done   COVID-19 Vaccine (5 - 2023-24 season) 04/28/2022   Flu Shot  03/29/2023   Medicare Annual Wellness Visit  01/31/2024   Colon Cancer Screening  06/13/2024   DTaP/Tdap/Td vaccine (3 - Td or Tdap) 11/05/2031   Pneumonia Vaccine  Completed   HPV Vaccine  Aged Out    Advanced directives: Please bring a copy of your health care power of attorney and living will to the office at your convenience.  Conditions/risks identified: stay healthy   Next appointment: Follow up in one year for your annual wellness visit.   Preventive Care 59 Years and Older, Male  Preventive care refers to lifestyle choices and visits with your health care provider that can promote health and wellness. What does preventive care include? A yearly physical exam. This is also called an annual well check. Dental exams once or twice a year. Routine eye exams. Ask your health care provider how often you should have your eyes checked. Personal lifestyle choices, including: Daily care of your teeth and gums. Regular physical activity. Eating a healthy diet. Avoiding tobacco and drug use. Limiting alcohol use. Practicing safe sex. Taking low doses of aspirin every day. Taking vitamin and mineral supplements as recommended by your health care provider. What happens during an annual well check? The services and  screenings done by your health care provider during your annual well check will depend on your age, overall health, lifestyle risk factors, and family history of disease. Counseling  Your health care provider may ask you questions about your: Alcohol use. Tobacco use. Drug use. Emotional well-being. Home and relationship well-being. Sexual activity. Eating habits. History of falls. Memory and ability to understand (cognition). Work and work Astronomer. Screening  You may have the following tests or measurements: Height, weight, and BMI. Blood pressure. Lipid and cholesterol levels. These may be checked every 5 years, or more frequently if you are over 42 years old. Skin check. Lung cancer screening. You may have this screening every year starting at age 48 if you have a 30-pack-year history of smoking and currently smoke or have quit within the past 15 years. Fecal occult blood test (FOBT) of the stool. You may have this test every year starting at age 76. Flexible sigmoidoscopy or colonoscopy. You may have a sigmoidoscopy every 5 years or a colonoscopy every 10 years starting at age 4. Prostate cancer screening. Recommendations will vary depending on your family history and other risks. Hepatitis C blood test. Hepatitis B blood test. Sexually transmitted disease (STD) testing. Diabetes screening. This is done by checking your blood sugar (glucose) after you have not eaten for a while (fasting). You may have this done every 1-3  years. Abdominal aortic aneurysm (AAA) screening. You may need this if you are a current or former smoker. Osteoporosis. You may be screened starting at age 59 if you are at high risk. Talk with your health care provider about your test results, treatment options, and if necessary, the need for more tests. Vaccines  Your health care provider may recommend certain vaccines, such as: Influenza vaccine. This is recommended every year. Tetanus, diphtheria, and  acellular pertussis (Tdap, Td) vaccine. You may need a Td booster every 10 years. Zoster vaccine. You may need this after age 75. Pneumococcal 13-valent conjugate (PCV13) vaccine. One dose is recommended after age 64. Pneumococcal polysaccharide (PPSV23) vaccine. One dose is recommended after age 67. Talk to your health care provider about which screenings and vaccines you need and how often you need them. This information is not intended to replace advice given to you by your health care provider. Make sure you discuss any questions you have with your health care provider. Document Released: 09/10/2015 Document Revised: 05/03/2016 Document Reviewed: 06/15/2015 Elsevier Interactive Patient Education  2017 [employer].  Fall Prevention in the Home Falls can cause injuries. They can happen to people of all ages. There are many things you can do to make your home safe and to help prevent falls. What can I do on the outside of my home? Regularly fix the edges of walkways and driveways and fix any cracks. Remove anything that might make you trip as you walk through a door, such as a raised step or threshold. Trim any bushes or trees on the path to your home. Use bright outdoor lighting. Clear any walking paths of anything that might make someone trip, such as rocks or tools. Regularly check to see if handrails are loose or broken. Make sure that both sides of any steps have handrails. Any raised decks and porches should have guardrails on the edges. Have any leaves, snow, or ice cleared regularly. Use sand or salt on walking paths during winter. Clean up any spills in your garage right away. This includes oil or grease spills. What can I do in the bathroom? Use night lights. Install grab bars by the toilet and in the tub and shower. Do not use towel bars as grab bars. Use non-skid mats or decals in the tub or shower. If you need to sit down in the shower, use a plastic, non-slip stool. Keep  the floor dry. Clean up any water that spills on the floor as soon as it happens. Remove soap buildup in the tub or shower regularly. Attach bath mats securely with double-sided non-slip rug tape. Do not have throw rugs and other things on the floor that can make you trip. What can I do in the bedroom? Use night lights. Make sure that you have a light by your bed that is easy to reach. Do not use any sheets or blankets that are too big for your bed. They should not hang down onto the floor. Have a firm chair that has side arms. You can use this for support while you get dressed. Do not have throw rugs and other things on the floor that can make you trip. What can I do in the kitchen? Clean up any spills right away. Avoid walking on wet floors. Keep items that you use a lot in easy-to-reach places. If you need to reach something above you, use a strong step stool that has a grab bar. Keep electrical cords out of the way. Do  not use floor polish or wax that makes floors slippery. If you must use wax, use non-skid floor wax. Do not have throw rugs and other things on the floor that can make you trip. What can I do with my stairs? Do not leave any items on the stairs. Make sure that there are handrails on both sides of the stairs and use them. Fix handrails that are broken or loose. Make sure that handrails are as long as the stairways. Check any carpeting to make sure that it is firmly attached to the stairs. Fix any carpet that is loose or worn. Avoid having throw rugs at the top or bottom of the stairs. If you do have throw rugs, attach them to the floor with carpet tape. Make sure that you have a light switch at the top of the stairs and the bottom of the stairs. If you do not have them, ask someone to add them for you. What else can I do to help prevent falls? Wear shoes that: Do not have high heels. Have rubber bottoms. Are comfortable and fit you well. Are closed at the toe. Do not  wear sandals. If you use a stepladder: Make sure that it is fully opened. Do not climb a closed stepladder. Make sure that both sides of the stepladder are locked into place. Ask someone to hold it for you, if possible. Clearly mark and make sure that you can see: Any grab bars or handrails. First and last steps. Where the edge of each step is. Use tools that help you move around (mobility aids) if they are needed. These include: Canes. Walkers. Scooters. Crutches. Turn on the lights when you go into a dark area. Replace any light bulbs as soon as they burn out. Set up your furniture so you have a clear path. Avoid moving your furniture around. If any of your floors are uneven, fix them. If there are any pets around you, be aware of where they are. Review your medicines with your doctor. Some medicines can make you feel dizzy. This can increase your chance of falling. Ask your doctor what other things that you can do to help prevent falls. This information is not intended to replace advice given to you by your health care provider. Make sure you discuss any questions you have with your health care provider. Document Released: 06/10/2009 Document Revised: 01/20/2016 Document Reviewed: 09/18/2014 Elsevier Interactive Patient Education  2017 [employer].

## 2023-03-05 DIAGNOSIS — Z7901 Long term (current) use of anticoagulants: Secondary | ICD-10-CM | POA: Diagnosis not present

## 2023-03-05 DIAGNOSIS — I4891 Unspecified atrial fibrillation: Secondary | ICD-10-CM | POA: Diagnosis not present

## 2023-03-05 DIAGNOSIS — Z5181 Encounter for therapeutic drug level monitoring: Secondary | ICD-10-CM | POA: Diagnosis not present

## 2023-03-08 ENCOUNTER — Telehealth: Payer: Self-pay | Admitting: Family Medicine

## 2023-03-08 NOTE — Telephone Encounter (Signed)
Pt's next OV is 7/23.  Please further advise.

## 2023-03-08 NOTE — Telephone Encounter (Signed)
Patient called and would like some advice prior to his appointment on 7/23. He reports that his heart rate has been under 50 the past few days and he is wondering if his medication bisoprolol-hydrochlorothiazide (ZIAC) 5-6.25 MG tablet  needs to be changes or adjusted or if he should wait until his appointment. Please give the patient a call to advise.

## 2023-03-09 ENCOUNTER — Encounter: Payer: Self-pay | Admitting: Family Medicine

## 2023-03-09 NOTE — Telephone Encounter (Signed)
As long as he is heals well then nothing needs to be changed.

## 2023-03-12 ENCOUNTER — Encounter: Payer: Self-pay | Admitting: Family Medicine

## 2023-03-12 ENCOUNTER — Ambulatory Visit: Payer: PPO | Admitting: Family Medicine

## 2023-03-12 VITALS — BP 117/70 | HR 65 | Wt 211.6 lb

## 2023-03-12 DIAGNOSIS — R5382 Chronic fatigue, unspecified: Secondary | ICD-10-CM

## 2023-03-12 DIAGNOSIS — D751 Secondary polycythemia: Secondary | ICD-10-CM

## 2023-03-12 DIAGNOSIS — E291 Testicular hypofunction: Secondary | ICD-10-CM | POA: Diagnosis not present

## 2023-03-12 DIAGNOSIS — R001 Bradycardia, unspecified: Secondary | ICD-10-CM | POA: Diagnosis not present

## 2023-03-12 DIAGNOSIS — R7303 Prediabetes: Secondary | ICD-10-CM

## 2023-03-12 DIAGNOSIS — I4821 Permanent atrial fibrillation: Secondary | ICD-10-CM

## 2023-03-12 DIAGNOSIS — I10 Essential (primary) hypertension: Secondary | ICD-10-CM | POA: Diagnosis not present

## 2023-03-12 LAB — POCT GLYCOSYLATED HEMOGLOBIN (HGB A1C)
HbA1c POC (<> result, manual entry): 5.7 % (ref 4.0–5.6)
HbA1c, POC (controlled diabetic range): 5.7 % (ref 0.0–7.0)
HbA1c, POC (prediabetic range): 5.7 % (ref 5.7–6.4)
Hemoglobin A1C: 5.7 % — AB (ref 4.0–5.6)

## 2023-03-12 MED ORDER — DILTIAZEM HCL 30 MG PO TABS: ORAL_TABLET | ORAL | 0 refills | Status: DC

## 2023-03-12 MED ORDER — BISOPROLOL-HYDROCHLOROTHIAZIDE 5-6.25 MG PO TABS: ORAL_TABLET | ORAL | Status: DC

## 2023-03-12 NOTE — Progress Notes (Signed)
OFFICE VISIT  03/12/2023  CC:  Chief Complaint  Patient presents with   Fatigue    Around the 1st of July; Pt states pulse has been down in the 40s/50s.     Patient is a 77 y.o. male who presents for fatigue/shortness of breath.  HPI: Jeffrey Little has noted acute episodes of feeling very fatigued and some dyspnea on exertion for the last several months.  About 2 weeks ago he began checking his blood pressure and heart rate more and noted that at times it drops into the 40s and he consistently feels his fatigue worse at that time.  The highest rates have been around 70 but is usually 50s to 60s. Blood pressures have been normal. He has not had any palpitations, chest pain, arm pain, jaw pain, nausea, or diaphoresis.  No lower extremity swelling or abnormal weight gain.  No orthopnea or PND. No fever or cough.  He has some occasional aches mostly in his hands but otherwise joints and muscles feel fine.  He does wear his CPAP every single night.  ROS as above, plus--> occasional brief dizziness. no HAs, no rashes, no melena/hematochezia.  No polyuria or polydipsia.   No focal weakness, paresthesias, or tremors.  No acute vision or hearing abnormalities.  No dysuria or unusual/new urinary urgency or frequency.  No n/v/d or abd pain.    Past Medical History:  Diagnosis Date   BPH (benign prostatic hyperplasia)    Cataract    Right; impairing vision as of 05/2017. (has ophthalmologist)   Chronic renal insufficiency, stage II (mild)    CrCl in the 60s   Coronary artery disease    GERD (gastroesophageal reflux disease)    Hearing loss    In a study at [facility] as of 2019   Hx of colonic polyps    03/2017 and 05/2021--adenomatous.  Recall 3 yrs. ([facility] spec)   Hypercholesteremia    Pt refuses statins (as of 05/2016). Declined again 02/2019.   Hypertension    IBS (irritable bowel syndrome)    OSA on CPAP    Rosey Bath Day, NP at Neurology-Sleep Medicine at [facility]   Peripheral vascular disease [facility])     Permanent atrial fibrillation (HCC) 2001   failed DCCV per pt.  Rate control + coumadin--monitored by cardiologist.   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  Colchicine started by UC   Polycythemia    Pt states he had w/u for hemachromatosis but it was NEG.  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   Prediabetes 06/22/2015   123.  A1c was 5.9%.  Stable at 6.0% 05/2016.  A1c 6.0% 11/2017. 5.9% 08/2019.   Prostate cancer screening    Pt declines any further prostate ca screening as of 05/2015.   Reactive airway disease    Scoliosis    Testosterone deficiency    Tick bite    tick removed from scrotum 02/08/20 ([place] tick per urgent care provider)    Past Surgical History:  Procedure Laterality Date   aortic ultrasound  09/2019   AAA screening; prox aorta 3cm->ULN, no aneurism.   CARDIAC CATHETERIZATION  approx 2001   nonobstruc CAD   CIRCUMCISION     COLONOSCOPY W/ POLYPECTOMY  05/12/11; 04/27/17   2018 and 05/2021--adenomatous polyp; repeat 3 yrs   NASAL POLYP SURGERY     SKIN CANCER EXCISION     Non-malanoma   TRANSTHORACIC ECHOCARDIOGRAM  10/2019   EF 55-60%, nl LV syst fxn, severe LA and RA dilation, valves fine.   UPPER GI ENDOSCOPY  10/07/2021   normal except gastric polyps->Dr. Alycia Rossetti.  Pt was then changed from protonix to aciphex   VASECTOMY  1983   VPAP  03/2013   14/4, EEP=11    Outpatient Medications Prior to Visit  Medication Sig Dispense Refill   albuterol (VENTOLIN HFA) 108 (90 Base) MCG/ACT inhaler Inhale 2 puffs into the lungs every 4 (four) hours as needed for wheezing or shortness of breath. 6.7 g 0   amLODipine (NORVASC) 10 MG tablet Take 1 tablet (10 mg total) by mouth daily. 90 tablet 1   bisoprolol-hydrochlorothiazide (ZIAC) 5-6.25 MG tablet Take 2 tablets by mouth daily. 180 tablet 1   Calcium Carbonate-Vitamin D 600-200 MG-UNIT TABS Take 1 tablet by mouth 2 (two) times daily.      fluticasone (FLONASE) 50 MCG/ACT nasal spray Place 2 sprays into both nostrils daily. 16 g 6   Glucosamine-Chondroit-Vit C-Mn (GLUCOSAMINE-CHONDROITIN MAX ST PO) Take 2 capsules by mouth daily.      hydrochlorothiazide (MICROZIDE) 12.5 MG capsule Take 1 capsule (12.5 mg total) by mouth daily. 90 capsule 1   Multiple Vitamins-Minerals (MENS MULTIVITAMIN PLUS PO) Take 1 tablet by mouth daily.     RABEprazole (ACIPHEX) 20 MG tablet Take 1 tablet (20 mg total) by mouth in the morning and at bedtime. 180 tablet 3   Testosterone 12.5 MG/ACT (1%) GEL apply 3 pumps onto THE SKIN three DAYS PER WEEK 150 g 5   valsartan (DIOVAN) 320 MG tablet Take 1 tablet (320 mg total) by mouth daily. 90 tablet 1   warfarin (COUMADIN) 2.5 MG tablet Take by mouth.     warfarin (COUMADIN) 2.5 MG tablet Take up to 1.5 tablets (3.75 mg total) by mouth daily or as directed. 135 tablet 3   diltiazem (CARDIZEM CD) 180 MG 24 hr capsule Take 1 capsule (180 mg total) by mouth daily. 90 capsule 1   ipratropium (ATROVENT) 0.03 % nasal spray Place 2 sprays into both nostrils every 12 (twelve) hours for 3 days as needed for runny nose 30 mL 1   No facility-administered medications prior to visit.    Allergies  Allergen Reactions   Milk (Cow) Other (See Comments)    Causes reflux if consumed in the pm.   Atorvastatin     REACTION: INTOL to Lipitor   Pollen Extract Other (See Comments)    Stuffy nose    Review of Systems  As per HPI  PE:    03/12/2023    1:53 PM 01/31/2023    8:04 AM 09/15/2022    8:09 AM  Vitals with BMI  Height   5\' 5"   Weight 211 lbs 10 oz 208 lbs 208 lbs 13 oz  BMI   34.75  Systolic 117  120  Diastolic 70  68  Pulse 65  65  02 sat 95% RA  Physical Exam  Gen: Alert, well appearing.  Patient is oriented to person, place, time, and situation. AFFECT: pleasant, lucid thought and speech. OZH:YQMV: no injection, icteris, swelling, or exudate.  EOMI, PERRLA. Mouth: lips without lesion/swelling.   Oral mucosa pink and moist. Oropharynx without erythema, exudate, or swelling.  CV: irreg irreg, rate approx 50, 1/6 syst murmur, no diast murmur, no r/g. Chest is clear, no wheezing or rales. Normal symmetric air entry throughout both lung fields. No chest wall deformities or tenderness. EXT: no clubbing or cyanosis.  Trace bilat LL pitting edema.    LABS:  Last CBC Lab Results  Component Value Date   WBC 5.5 09/15/2022   HGB 17.2 (H) 09/15/2022   HCT 50.5 09/15/2022   MCV 92.3 09/15/2022   MCH 31.6 12/07/2015   RDW 14.1 09/15/2022   PLT 176.0 09/15/2022   Lab Results  Component Value Date   IRON 132 09/15/2011   Lab Results  Component Value Date   TESTOSTERONE 296.46 (L) 09/15/2022   Last metabolic panel Lab Results  Component Value Date   GLUCOSE 116 (H) 09/15/2022   NA 140 09/15/2022   K 3.8 09/15/2022   CL 98 09/15/2022   CO2 32 09/15/2022   BUN 21 09/15/2022   CREATININE 1.13 09/15/2022   GFR 62.86 09/15/2022   CALCIUM 9.3 09/15/2022   PROT 7.3 09/15/2022   ALBUMIN 4.5 09/15/2022   BILITOT 1.1 09/15/2022   ALKPHOS 54 09/15/2022   AST 18 09/15/2022   ALT 18 09/15/2022   Last lipids Lab Results  Component Value Date   CHOL 174 09/15/2022   HDL 35.10 (L) 09/15/2022   LDLCALC 112 (H) 09/15/2022   LDLDIRECT 129.0 03/24/2022   TRIG 137.0 09/15/2022   CHOLHDL 5 09/15/2022   Last hemoglobin A1c Lab Results  Component Value Date   HGBA1C 5.7 (A) 03/12/2023   HGBA1C 5.7 03/12/2023   HGBA1C 5.7 03/12/2023   HGBA1C 5.7 03/12/2023   Last thyroid functions Lab Results  Component Value Date   TSH 0.60 12/01/2013   12 lead EKG today: A-fib, rate 56, QRS 84, Qtc 407, poor R wave transition. No viewable prior EKG available for comparison. EKG dictated in cardiologist's note from 07/07/22 states " A-fib with controlled vent response, tendency to low voltage, Qtc is 380".  Rate was 55.  IMPRESSION AND PLAN:  Symptomatic bradycardia in the setting of chronic  atrial fibrillation on rate control medication. Stop Cardizem CD 180 mg tab. Okay to continue one of the 5/6.25 Ziac tabs daily. I prescribed Cardizem 30 mg tab to take 1 as needed for rapid A-fib. He will call if this occurs. I think it is okay to leave his current cardiology follow-up visit for mid August and we can adjust if he does not respond to our treatments.  An After Visit Summary was printed and given to the patient.  FOLLOW UP: Return for keep appt set for next week.  Signed:  Santiago Bumpers, MD           03/12/2023

## 2023-03-13 LAB — COMPREHENSIVE METABOLIC PANEL
ALT: 16 U/L (ref 0–53)
AST: 17 U/L (ref 0–37)
Albumin: 4.4 g/dL (ref 3.5–5.2)
Alkaline Phosphatase: 50 U/L (ref 39–117)
BUN: 18 mg/dL (ref 6–23)
CO2: 27 mEq/L (ref 19–32)
Calcium: 9.5 mg/dL (ref 8.4–10.5)
Chloride: 101 mEq/L (ref 96–112)
Creatinine, Ser: 1.33 mg/dL (ref 0.40–1.50)
GFR: 51.51 mL/min — ABNORMAL LOW (ref >60.00)
Glucose, Bld: 92 mg/dL (ref 70–99)
Potassium: 4.1 mEq/L (ref 3.5–5.1)
Sodium: 137 mEq/L (ref 135–145)
Total Bilirubin: 0.9 mg/dL (ref 0.2–1.2)
Total Protein: 7 g/dL (ref 6.0–8.3)

## 2023-03-13 LAB — CBC
HCT: 47.3 % (ref 39.0–52.0)
Hemoglobin: 16.2 g/dL (ref 13.0–17.0)
MCHC: 34.3 g/dL (ref 30.0–36.0)
MCV: 92.2 fl (ref 78.0–100.0)
Platelets: 196 10*3/uL (ref 150.0–400.0)
RBC: 5.13 Mil/uL (ref 4.22–5.81)
RDW: 14 % (ref 11.5–15.5)
WBC: 6.6 10*3/uL (ref 4.0–10.5)

## 2023-03-13 LAB — TSH: TSH: 1.53 u[IU]/mL (ref 0.35–5.50)

## 2023-03-18 ENCOUNTER — Encounter: Payer: Self-pay | Admitting: Family Medicine

## 2023-03-19 ENCOUNTER — Other Ambulatory Visit: Payer: Self-pay | Admitting: Family Medicine

## 2023-03-20 ENCOUNTER — Other Ambulatory Visit (HOSPITAL_COMMUNITY): Payer: Self-pay

## 2023-03-20 ENCOUNTER — Other Ambulatory Visit: Payer: Self-pay

## 2023-03-20 ENCOUNTER — Ambulatory Visit (INDEPENDENT_AMBULATORY_CARE_PROVIDER_SITE_OTHER): Payer: PPO | Admitting: Family Medicine

## 2023-03-20 ENCOUNTER — Encounter: Payer: Self-pay | Admitting: Family Medicine

## 2023-03-20 VITALS — BP 122/84 | HR 56 | Ht 65.0 in | Wt 208.4 lb

## 2023-03-20 DIAGNOSIS — I482 Chronic atrial fibrillation, unspecified: Secondary | ICD-10-CM

## 2023-03-20 DIAGNOSIS — E7800 Pure hypercholesterolemia, unspecified: Secondary | ICD-10-CM

## 2023-03-20 DIAGNOSIS — I10 Essential (primary) hypertension: Secondary | ICD-10-CM | POA: Diagnosis not present

## 2023-03-20 DIAGNOSIS — Z0000 Encounter for general adult medical examination without abnormal findings: Secondary | ICD-10-CM

## 2023-03-20 DIAGNOSIS — R5382 Chronic fatigue, unspecified: Secondary | ICD-10-CM

## 2023-03-20 DIAGNOSIS — E291 Testicular hypofunction: Secondary | ICD-10-CM | POA: Diagnosis not present

## 2023-03-20 MED ORDER — AMLODIPINE BESYLATE 10 MG PO TABS
10.0000 mg | ORAL_TABLET | Freq: Every day | ORAL | 1 refills | Status: DC
  Filled 2023-03-20 – 2023-03-29 (×2): qty 90, 90d supply, fill #0
  Filled 2023-06-26: qty 90, 90d supply, fill #1

## 2023-03-20 MED ORDER — HYDROCHLOROTHIAZIDE 12.5 MG PO CAPS
12.5000 mg | ORAL_CAPSULE | Freq: Every day | ORAL | 1 refills | Status: DC
  Filled 2023-03-20 – 2023-04-12 (×2): qty 90, 90d supply, fill #0
  Filled 2023-07-09: qty 90, 90d supply, fill #1

## 2023-03-20 NOTE — Progress Notes (Signed)
Office Note 03/20/2023  CC:  Chief Complaint  Patient presents with   Annual Exam    Pt is fasting.    Patient is a 77 y.o. male who is here for annual health maintenance exam and follow-up hypertension, hypogonadism, and recent increased fatigue. I saw him last on 03/12/2023. A/P as of last visit: "Symptomatic bradycardia in the setting of chronic atrial fibrillation on rate control medication. Stop Cardizem CD 180 mg tab. Okay to continue one of the 5/6.25 Ziac tabs daily. I prescribed Cardizem 30 mg tab to take 1 as needed for rapid A-fib. He will call if this occurs. I think it is okay to leave his current cardiology follow-up visit for mid August and we can adjust if he does not respond to our treatments."  INTERIM HX: Mcclellan feels good. He Occasionally feels a little bit of sensation of his heart beating fast but when checking it it is normal. Home blood pressures around 130/80 average.  Heart rate in the 55-70 range. No periods of acute fatigue, dizziness, or presyncope.   Past Medical History:  Diagnosis Date   BPH (benign prostatic hyperplasia)    Cataract    Right; impairing vision as of 05/2017. (has ophthalmologist)   Chronic renal insufficiency, stage II (mild)    CrCl in the 60s   Coronary artery disease    GERD (gastroesophageal reflux disease)    Hearing loss    In a study at [facility] as of 2019   Hx of colonic polyps    03/2017 and 05/2021--adenomatous.  Recall 3 yrs. ([facility] spec)   Hypercholesteremia    Pt refuses statins (as of 05/2016). Declined again 02/2019.   Hypertension    IBS (irritable bowel syndrome)    OSA on CPAP    Rosey Bath Day, NP at Neurology-Sleep Medicine at [facility]   Peripheral vascular disease [facility])    Permanent atrial fibrillation (HCC) 2001   failed DCCV per pt.  Rate control + coumadin--monitored by cardiologist.   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  Colchicine started by UC    Polycythemia    Pt states he had w/u for hemachromatosis but it was NEG.  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   Prediabetes 06/22/2015   123.  A1c was 5.9%.  Stable at 6.0% 05/2016.  A1c 6.0% 11/2017. 5.9% 08/2019.   Prostate cancer screening    Pt declines any further prostate ca screening as of 05/2015.   Reactive airway disease    Scoliosis    Testosterone deficiency     Past Surgical History:  Procedure Laterality Date   aortic ultrasound  09/2019   AAA screening; prox aorta 3cm->ULN, no aneurism.   CARDIAC CATHETERIZATION  approx 2001   nonobstruc CAD   CIRCUMCISION     COLONOSCOPY W/ POLYPECTOMY  05/12/11; 04/27/17   2018 and 05/2021--adenomatous polyp; repeat 3 yrs   NASAL POLYP SURGERY     SKIN CANCER EXCISION     Non-malanoma   TRANSTHORACIC ECHOCARDIOGRAM  10/2019   EF 55-60%, nl LV syst fxn, severe LA and RA dilation, valves fine.   UPPER GI ENDOSCOPY  10/07/2021   normal except gastric polyps->Dr. Alycia Rossetti.  Pt was then changed from protonix to aciphex   VASECTOMY  1983   VPAP  03/2013   14/4, EEP=11    History reviewed. No pertinent family history.  Social History   Socioeconomic History  Marital status: Married    Spouse name: Not on file   Number of children: 2   Years of education: Not on file   Highest education level: Not on file  Occupational History   Occupation: retired  Tobacco Use   Smoking status: Former    Types: Cigars    Quit date: 08/28/1974    Years since quitting: 48.5   Smokeless tobacco: Never  Substance and Sexual Activity   Alcohol use: No   Drug use: No   Sexual activity: Not on file  Other Topics Concern   Not on file  Social History Narrative   Married, 2 sons, 4 grandchildren.   Orig from [place].   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   No cigs, no alcohol, no drugs.   No exercise.   Social Determinants of Health   Financial Resource  Strain: Low Risk  (01/31/2023)   Overall Financial Resource Strain (CARDIA)    Difficulty of Paying Living Expenses: Not hard at all  Food Insecurity: No Food Insecurity (01/31/2023)   Hunger Vital Sign    Worried About Running Out of Food in the Last Year: Never true    Ran Out of Food in the Last Year: Never true  Transportation Needs: No Transportation Needs (01/31/2023)   PRAPARE - Administrator, Civil Service (Medical): No    Lack of Transportation (Non-Medical): No  Physical Activity: Inactive (01/31/2023)   Exercise Vital Sign    Days of Exercise per Week: 0 days    Minutes of Exercise per Session: 0 min  Stress: No Stress Concern Present (01/31/2023)   [employer] of Occupational Health - Occupational Stress Questionnaire    Feeling of Stress : Not at all  Social Connections: Socially Integrated (01/31/2023)   Social Connection and Isolation Panel [NHANES]    Frequency of Communication with Friends and Family: More than three times a week    Frequency of Social Gatherings with Friends and Family: More than three times a week    Attends Religious Services: More than 4 times per year    Active Member of [employer] or Organizations: Yes    Attends Banker Meetings: 1 to 4 times per year    Marital Status: Married  Catering manager Violence: Not At Risk (01/31/2023)   Humiliation, Afraid, Rape, and Kick questionnaire    Fear of Current or Ex-Partner: No    Emotionally Abused: No    Physically Abused: No    Sexually Abused: No    Outpatient Medications Prior to Visit  Medication Sig Dispense Refill   albuterol (VENTOLIN HFA) 108 (90 Base) MCG/ACT inhaler Inhale 2 puffs into the lungs every 4 (four) hours as needed for wheezing or shortness of breath. 6.7 g 0   bisoprolol-hydrochlorothiazide (ZIAC) 5-6.25 MG tablet 1 tab po qd     Calcium Carbonate-Vitamin D 600-200 MG-UNIT TABS Take 1 tablet by mouth 2 (two) times daily.     fluticasone (FLONASE) 50 MCG/ACT nasal  spray Place 2 sprays into both nostrils daily. 16 g 6   Glucosamine-Chondroit-Vit C-Mn (GLUCOSAMINE-CHONDROITIN MAX ST PO) Take 2 capsules by mouth daily.      Multiple Vitamins-Minerals (MENS MULTIVITAMIN PLUS PO) Take 1 tablet by mouth daily.     RABEprazole (ACIPHEX) 20 MG tablet Take 1 tablet (20 mg total) by mouth in the morning and at bedtime. 180 tablet 3   Testosterone 12.5 MG/ACT (1%) GEL apply 3 pumps onto THE  SKIN three DAYS PER WEEK 150 g 5   valsartan (DIOVAN) 320 MG tablet Take 1 tablet (320 mg total) by mouth daily. 90 tablet 1   warfarin (COUMADIN) 2.5 MG tablet Take by mouth.     warfarin (COUMADIN) 2.5 MG tablet Take up to 1.5 tablets (3.75 mg total) by mouth daily or as directed. 135 tablet 3   diltiazem (CARDIZEM) 30 MG tablet 1 tab po bid AS NEEDED for rapid heart rate (Patient not taking: Reported on 03/20/2023) 30 tablet 0   ipratropium (ATROVENT) 0.03 % nasal spray Place 2 sprays into both nostrils every 12 (twelve) hours for 3 days as needed for runny nose 30 mL 1   amLODipine (NORVASC) 10 MG tablet Take 1 tablet (10 mg total) by mouth daily. 90 tablet 1   hydrochlorothiazide (MICROZIDE) 12.5 MG capsule Take 1 capsule (12.5 mg total) by mouth daily. 90 capsule 1   No facility-administered medications prior to visit.    Allergies  Allergen Reactions   Milk (Cow) Other (See Comments)    Causes reflux if consumed in the pm.   Atorvastatin     REACTION: INTOL to Lipitor   Pollen Extract Other (See Comments)    Stuffy nose    Review of Systems  Constitutional:  Negative for appetite change, chills, fatigue and fever.  HENT:  Negative for congestion, dental problem, ear pain and sore throat.   Eyes:  Negative for discharge, redness and visual disturbance.  Respiratory:  Negative for cough, chest tightness, shortness of breath and wheezing.   Cardiovascular:  Negative for chest pain, palpitations and leg swelling.  Gastrointestinal:  Negative for abdominal pain, blood  in stool, diarrhea, nausea and vomiting.  Genitourinary:  Negative for difficulty urinating, dysuria, flank pain, frequency, hematuria and urgency.  Musculoskeletal:  Negative for arthralgias, back pain, joint swelling, myalgias and neck stiffness.  Skin:  Negative for pallor and rash.  Neurological:  Negative for dizziness, speech difficulty, weakness and headaches.  Hematological:  Negative for adenopathy. Does not bruise/bleed easily.  Psychiatric/Behavioral:  Negative for confusion and sleep disturbance. The patient is not nervous/anxious.     PE;    03/20/2023    8:04 AM 03/12/2023    1:53 PM 01/31/2023    8:04 AM  Vitals with BMI  Height 5\' 5"     Weight 208 lbs 6 oz 211 lbs 10 oz 208 lbs  BMI 34.68    Systolic 122 117   Diastolic 84 70   Pulse 56 65    Gen: Alert, well appearing.  Patient is oriented to person, place, time, and situation. AFFECT: pleasant, lucid thought and speech. ENT: Ears: EACs clear, normal epithelium.  TMs with good light reflex and landmarks bilaterally.  Eyes: no injection, icteris, swelling, or exudate.  EOMI, PERRLA. Nose: no drainage or turbinate edema/swelling.  No injection or focal lesion.  Mouth: lips without lesion/swelling.  Oral mucosa pink and moist.  Dentition intact and without obvious caries or gingival swelling.  Oropharynx without erythema, exudate, or swelling.  Neck: supple/nontender.  No LAD, mass, or TM.  Carotid pulses 2+ bilaterally, without bruits. CV: Irregularly irregular, rate around 60-65.  No m/r/g.   LUNGS: CTA bilat, nonlabored resps, good aeration in all lung fields. ABD: soft, NT, ND, BS normal.  No hepatospenomegaly or mass.  No bruits. EXT: no clubbing, cyanosis, or edema.  Musculoskeletal: no joint swelling, erythema, warmth, or tenderness.  ROM of all joints intact. Skin - no sores or suspicious lesions or  rashes or color changes  Pertinent labs:  Lab Results  Component Value Date   TSH 1.53 03/12/2023   Lab Results   Component Value Date   WBC 6.6 03/12/2023   HGB 16.2 03/12/2023   HCT 47.3 03/12/2023   MCV 92.2 03/12/2023   PLT 196.0 03/12/2023   Lab Results  Component Value Date   CREATININE 1.33 03/12/2023   BUN 18 03/12/2023   NA 137 03/12/2023   K 4.1 03/12/2023   CL 101 03/12/2023   CO2 27 03/12/2023   Lab Results  Component Value Date   ALT 16 03/12/2023   AST 17 03/12/2023   ALKPHOS 50 03/12/2023   BILITOT 0.9 03/12/2023   Lab Results  Component Value Date   CHOL 174 09/15/2022   Lab Results  Component Value Date   HDL 35.10 (L) 09/15/2022   Lab Results  Component Value Date   LDLCALC 112 (H) 09/15/2022   Lab Results  Component Value Date   TRIG 137.0 09/15/2022   Lab Results  Component Value Date   CHOLHDL 5 09/15/2022   Lab Results  Component Value Date   PSA 2.19 12/21/2017   PSA 2.15 06/21/2016   PSA 2.53 12/01/2013   Lab Results  Component Value Date   HGBA1C 5.7 (A) 03/12/2023   HGBA1C 5.7 03/12/2023   HGBA1C 5.7 03/12/2023   HGBA1C 5.7 03/12/2023   Lab Results  Component Value Date   TESTOSTERONE 296.46 (L) 09/15/2022   ASSESSMENT AND PLAN:   #1 health maintenance exam: Reviewed age and gender appropriate health maintenance issues (prudent diet, regular exercise, health risks of tobacco and excessive alcohol, use of seatbelts, fire alarms in home, use of sunscreen).  Also reviewed age and gender appropriate health screening as well as vaccine recommendations. Vaccines: pt declines shingrix.  O/w UTD. Labs: FLP (recent cbc, cmet, TSH, and Hba1c all good 8 d/a). Prostate ca screening: pt declines further screening. Colon ca screening: hx polyps, recall 05/2024  #2 hypertension, well-controlled on amlodipine 10 mg a day, bisoprolol/HCTZ 5/6.251 tab daily, HCTZ 12.5 mg daily, and valsartan 320 mg a day. Recent electrolytes and creatinine stable.  #3 chronic atrial fibrillation. Recent increase in chronic fatigue suspected to be from too much  rate control medication. He feels better since being off of his extended release diltiazem. He has not had to use any of his as needed low-dose/short acting diltiazem.  #4 hypogonadism. He feels a little bit of improvement from being on testosterone gel.  Continue 3 pumps bilateral 3 days a week. Last testosterone level was about 7 months ago and was 296. Recent hemoglobin 16.2.  #5 hypercholesterolemia. He does not want trial of any cholesterol-lowering medication. Will forego lipid testing today.  An After Visit Summary was printed and given to the patient.  FOLLOW UP:  Return in about 6 months (around 09/20/2023) for routine chronic illness f/u.  Signed:  Santiago Bumpers, MD           03/20/2023

## 2023-03-30 ENCOUNTER — Other Ambulatory Visit (HOSPITAL_COMMUNITY): Payer: Self-pay

## 2023-04-11 DIAGNOSIS — R9431 Abnormal electrocardiogram [ECG] [EKG]: Secondary | ICD-10-CM | POA: Diagnosis not present

## 2023-04-11 DIAGNOSIS — M4120 Other idiopathic scoliosis, site unspecified: Secondary | ICD-10-CM | POA: Diagnosis not present

## 2023-04-11 DIAGNOSIS — R001 Bradycardia, unspecified: Secondary | ICD-10-CM | POA: Diagnosis not present

## 2023-04-11 DIAGNOSIS — Z7901 Long term (current) use of anticoagulants: Secondary | ICD-10-CM | POA: Diagnosis not present

## 2023-04-11 DIAGNOSIS — I482 Chronic atrial fibrillation, unspecified: Secondary | ICD-10-CM | POA: Diagnosis not present

## 2023-04-11 DIAGNOSIS — Z5181 Encounter for therapeutic drug level monitoring: Secondary | ICD-10-CM | POA: Diagnosis not present

## 2023-04-12 DIAGNOSIS — I4891 Unspecified atrial fibrillation: Secondary | ICD-10-CM | POA: Diagnosis not present

## 2023-04-13 ENCOUNTER — Other Ambulatory Visit (HOSPITAL_COMMUNITY): Payer: Self-pay

## 2023-04-13 ENCOUNTER — Other Ambulatory Visit: Payer: Self-pay

## 2023-04-18 ENCOUNTER — Telehealth: Payer: Self-pay | Admitting: Family Medicine

## 2023-04-18 MED ORDER — TESTOSTERONE 12.5 MG/ACT (1%) TD GEL
TRANSDERMAL | 5 refills | Status: DC
  Filled 2023-04-18: qty 150, 90d supply, fill #0

## 2023-04-18 NOTE — Telephone Encounter (Signed)
Jeffrey Little has recently switched pharmacies and is now using [facility] mail order. He is needing his prescription of Testosterone 12.5 MG/ACT (1%) GEL sent to that pharmacy now.

## 2023-04-18 NOTE — Telephone Encounter (Signed)
Okay, testosterone prescription sent

## 2023-04-19 ENCOUNTER — Other Ambulatory Visit: Payer: Self-pay

## 2023-04-19 ENCOUNTER — Other Ambulatory Visit (HOSPITAL_COMMUNITY): Payer: Self-pay

## 2023-04-19 NOTE — Telephone Encounter (Signed)
Pt aware.

## 2023-04-20 ENCOUNTER — Other Ambulatory Visit (HOSPITAL_COMMUNITY): Payer: Self-pay

## 2023-04-20 ENCOUNTER — Other Ambulatory Visit: Payer: Self-pay

## 2023-05-15 DIAGNOSIS — E669 Obesity, unspecified: Secondary | ICD-10-CM | POA: Insufficient documentation

## 2023-05-15 DIAGNOSIS — N182 Chronic kidney disease, stage 2 (mild): Secondary | ICD-10-CM | POA: Insufficient documentation

## 2023-05-31 DIAGNOSIS — Z7901 Long term (current) use of anticoagulants: Secondary | ICD-10-CM | POA: Diagnosis not present

## 2023-05-31 DIAGNOSIS — Z5181 Encounter for therapeutic drug level monitoring: Secondary | ICD-10-CM | POA: Diagnosis not present

## 2023-05-31 DIAGNOSIS — I482 Chronic atrial fibrillation, unspecified: Secondary | ICD-10-CM | POA: Diagnosis not present

## 2023-05-31 DIAGNOSIS — G4739 Other sleep apnea: Secondary | ICD-10-CM | POA: Diagnosis not present

## 2023-06-26 ENCOUNTER — Other Ambulatory Visit (HOSPITAL_COMMUNITY): Payer: Self-pay

## 2023-06-26 ENCOUNTER — Other Ambulatory Visit: Payer: Self-pay

## 2023-06-26 ENCOUNTER — Other Ambulatory Visit: Payer: Self-pay | Admitting: Family Medicine

## 2023-06-26 MED ORDER — VALSARTAN 320 MG PO TABS
320.0000 mg | ORAL_TABLET | Freq: Every day | ORAL | 1 refills | Status: DC
  Filled 2023-06-26: qty 90, 90d supply, fill #0
  Filled 2023-09-25 – 2023-09-28 (×2): qty 90, 90d supply, fill #1

## 2023-06-26 MED ORDER — ALBUTEROL SULFATE HFA 108 (90 BASE) MCG/ACT IN AERS
2.0000 | INHALATION_SPRAY | RESPIRATORY_TRACT | 0 refills | Status: DC | PRN
  Filled 2023-06-26: qty 6.7, 16d supply, fill #0

## 2023-06-27 ENCOUNTER — Other Ambulatory Visit (HOSPITAL_COMMUNITY): Payer: Self-pay

## 2023-06-28 ENCOUNTER — Other Ambulatory Visit (HOSPITAL_COMMUNITY): Payer: Self-pay

## 2023-07-02 ENCOUNTER — Other Ambulatory Visit: Payer: Self-pay | Admitting: Family Medicine

## 2023-07-02 ENCOUNTER — Encounter: Payer: Self-pay | Admitting: Family Medicine

## 2023-07-02 ENCOUNTER — Other Ambulatory Visit (HOSPITAL_COMMUNITY): Payer: Self-pay

## 2023-07-02 MED ORDER — BISOPROLOL-HYDROCHLOROTHIAZIDE 5-6.25 MG PO TABS
1.0000 | ORAL_TABLET | Freq: Every day | ORAL | 1 refills | Status: DC
  Filled 2023-07-02: qty 90, 90d supply, fill #0
  Filled 2023-09-11: qty 90, 90d supply, fill #1
  Filled 2023-12-03: qty 90, 90d supply, fill #2
  Filled 2024-03-04 – 2024-03-12 (×2): qty 90, 90d supply, fill #3

## 2023-07-09 ENCOUNTER — Other Ambulatory Visit (HOSPITAL_COMMUNITY): Payer: Self-pay

## 2023-07-09 DIAGNOSIS — I482 Chronic atrial fibrillation, unspecified: Secondary | ICD-10-CM | POA: Diagnosis not present

## 2023-07-09 DIAGNOSIS — Z7901 Long term (current) use of anticoagulants: Secondary | ICD-10-CM | POA: Diagnosis not present

## 2023-07-09 DIAGNOSIS — Z5181 Encounter for therapeutic drug level monitoring: Secondary | ICD-10-CM | POA: Diagnosis not present

## 2023-08-02 DIAGNOSIS — M19072 Primary osteoarthritis, left ankle and foot: Secondary | ICD-10-CM | POA: Diagnosis not present

## 2023-08-02 DIAGNOSIS — M722 Plantar fascial fibromatosis: Secondary | ICD-10-CM | POA: Diagnosis not present

## 2023-08-02 DIAGNOSIS — M19071 Primary osteoarthritis, right ankle and foot: Secondary | ICD-10-CM | POA: Diagnosis not present

## 2023-08-02 DIAGNOSIS — G5793 Unspecified mononeuropathy of bilateral lower limbs: Secondary | ICD-10-CM | POA: Diagnosis not present

## 2023-08-03 DIAGNOSIS — L570 Actinic keratosis: Secondary | ICD-10-CM | POA: Diagnosis not present

## 2023-08-03 DIAGNOSIS — L821 Other seborrheic keratosis: Secondary | ICD-10-CM | POA: Diagnosis not present

## 2023-08-03 DIAGNOSIS — L853 Xerosis cutis: Secondary | ICD-10-CM | POA: Diagnosis not present

## 2023-08-03 DIAGNOSIS — L820 Inflamed seborrheic keratosis: Secondary | ICD-10-CM | POA: Diagnosis not present

## 2023-08-03 DIAGNOSIS — D1801 Hemangioma of skin and subcutaneous tissue: Secondary | ICD-10-CM | POA: Diagnosis not present

## 2023-08-20 DIAGNOSIS — I482 Chronic atrial fibrillation, unspecified: Secondary | ICD-10-CM | POA: Diagnosis not present

## 2023-08-20 DIAGNOSIS — Z5181 Encounter for therapeutic drug level monitoring: Secondary | ICD-10-CM | POA: Diagnosis not present

## 2023-08-20 DIAGNOSIS — Z7901 Long term (current) use of anticoagulants: Secondary | ICD-10-CM | POA: Diagnosis not present

## 2023-09-11 ENCOUNTER — Other Ambulatory Visit: Payer: Self-pay | Admitting: Family Medicine

## 2023-09-12 ENCOUNTER — Other Ambulatory Visit (HOSPITAL_COMMUNITY): Payer: Self-pay

## 2023-09-17 NOTE — Progress Notes (Unsigned)
OFFICE VISIT  09/18/2023  CC:  Chief Complaint  Patient presents with   Medical Management of Chronic Issues    Pt is fasting.     Patient is a 78 y.o. male who presents for 6 mo f/u HTN, hypogonadism, and a-fib. A/P as of last visit: "#1 hypertension, well-controlled on amlodipine 10 mg a day, bisoprolol/HCTZ 5/6.251 tab daily, HCTZ 12.5 mg daily, and valsartan 320 mg a day. Recent electrolytes and creatinine stable.   #2 chronic atrial fibrillation. Recent increase in chronic fatigue suspected to be from too much rate control medication. He feels better since being off of his extended release diltiazem. He has not had to use any of his as needed low-dose/short acting diltiazem.   #3 hypogonadism. He feels a little bit of improvement from being on testosterone gel.  Continue 3 pumps bilateral 3 days a week. Last testosterone level was about 7 months ago and was 296. Recent hemoglobin 16.2.   #4 hypercholesterolemia. He does not want trial of any cholesterol-lowering medication. Will forego lipid testing today."  INTERIM HX: Jeffrey Little feels well. Has not been dieting very good lately. Home blood pressures consistently in the 130/80 range or better. Heart rate typically in the 70s but occasionally down into the upper 50s. He feels no palpitations, dizziness, presyncope, chest pain, or shortness of breath.  Did not apply his testosterone today.  He usually does this 2-3 times a week.  He will be taking a trip to [place] soon.  Has been seeing a podiatrist recently for bilateral feet pain.  Has been diagnosed with arthritis and heel spurs.  Feeling better lately.  ROS as above, plus--> no fevers,no wheezing, no cough,  no HAs, no rashes, no melena/hematochezia.  No polyuria or polydipsia.  No myalgias or arthralgias.  No focal weakness, paresthesias, or tremors.  No acute vision or hearing abnormalities.  No dysuria or unusual/new urinary urgency or frequency.  No recent changes  in lower legs. No n/v/d or abd pain.     Past Medical History:  Diagnosis Date   BPH (benign prostatic hyperplasia)    Cataract    Right; impairing vision as of 05/2017. (has ophthalmologist)   Chronic renal insufficiency, stage II (mild)    CrCl in the 60s   Coronary artery disease    GERD (gastroesophageal reflux disease)    Hearing loss    In a study at [facility] as of 2019   Hx of colonic polyps    03/2017 and 05/2021--adenomatous.  Recall 3 yrs. ([facility] spec)   Hypercholesteremia    Pt refuses statins (as of 05/2016). Declined again 02/2019.   Hypertension    IBS (irritable bowel syndrome)    OSA on CPAP    Rosey Bath Day, NP at Neurology-Sleep Medicine at [facility]   Peripheral vascular disease [facility])    Permanent atrial fibrillation (HCC) 2001   failed DCCV per pt.  Rate control + coumadin--monitored by cardiologist.   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  Colchicine started by UC   Polycythemia    Pt states he had w/u for hemachromatosis but it was NEG.  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   Prediabetes 06/22/2015   123.  A1c was 5.9%.  Stable at 6.0% 05/2016.  A1c 6.0% 11/2017. 5.9% 08/2019.   Prostate cancer screening    Pt declines any further prostate ca screening as of 05/2015.   Reactive  airway disease    Scoliosis    Testosterone deficiency     Past Surgical History:  Procedure Laterality Date   aortic ultrasound  09/2019   AAA screening; prox aorta 3cm->ULN, no aneurism.   CARDIAC CATHETERIZATION  approx 2001   nonobstruc CAD   CIRCUMCISION     COLONOSCOPY W/ POLYPECTOMY  05/12/11; 04/27/17   2018 and 05/2021--adenomatous polyp; repeat 3 yrs   NASAL POLYP SURGERY     SKIN CANCER EXCISION     Non-malanoma   TRANSTHORACIC ECHOCARDIOGRAM  10/2019   EF 55-60%, nl LV syst fxn, severe LA and RA dilation, valves fine.   UPPER GI ENDOSCOPY  10/07/2021   normal except gastric polyps->Dr.  Alycia Rossetti.  Pt was then changed from protonix to aciphex   VASECTOMY  1983   VPAP  03/2013   14/4, EEP=11    Outpatient Medications Prior to Visit  Medication Sig Dispense Refill   albuterol (VENTOLIN HFA) 108 (90 Base) MCG/ACT inhaler Inhale 2 puffs into the lungs every 4 (four) hours as needed for wheezing or shortness of breath. 6.7 g 0   bisoprolol-hydrochlorothiazide (ZIAC) 5-6.25 MG tablet Take 1 tablet by mouth daily. 180 tablet 1   Calcium Carbonate-Vitamin D 600-200 MG-UNIT TABS Take 1 tablet by mouth 2 (two) times daily.     diltiazem (CARDIZEM) 30 MG tablet 1 tab po bid AS NEEDED for rapid heart rate 30 tablet 0   fluticasone (FLONASE) 50 MCG/ACT nasal spray Place 2 sprays into both nostrils daily. 16 g 6   Glucosamine-Chondroit-Vit C-Mn (GLUCOSAMINE-CHONDROITIN MAX ST PO) Take 2 capsules by mouth daily.      Multiple Vitamins-Minerals (MENS MULTIVITAMIN PLUS PO) Take 1 tablet by mouth daily.     Testosterone 12.5 MG/ACT (1%) GEL apply 3 pumps onto THE SKIN three DAYS PER WEEK 150 g 5   valsartan (DIOVAN) 320 MG tablet Take 1 tablet (320 mg total) by mouth daily. 90 tablet 1   warfarin (COUMADIN) 2.5 MG tablet Take by mouth.     warfarin (COUMADIN) 2.5 MG tablet Take up to 1.5 tablets (3.75 mg total) by mouth daily or as directed. 135 tablet 3   bisoprolol-hydrochlorothiazide (ZIAC) 5-6.25 MG tablet 1 tab po qd     ipratropium (ATROVENT) 0.03 % nasal spray Place 2 sprays into both nostrils every 12 (twelve) hours for 3 days as needed for runny nose 30 mL 1   RABEprazole (ACIPHEX) 20 MG tablet Take 1 tablet (20 mg total) by mouth in the morning and at bedtime. 180 tablet 3   amLODipine (NORVASC) 10 MG tablet Take 1 tablet (10 mg total) by mouth daily. 90 tablet 1   hydrochlorothiazide (MICROZIDE) 12.5 MG capsule Take 1 capsule (12.5 mg total) by mouth daily. 90 capsule 1   No facility-administered medications prior to visit.    Allergies  Allergen Reactions   Milk (Cow) Other (See  Comments)    Causes reflux if consumed in the pm.   Atorvastatin     REACTION: INTOL to Lipitor   Pollen Extract Other (See Comments)    Stuffy nose    Review of Systems As per HPI  PE:    09/18/2023    8:10 AM 03/20/2023    8:04 AM 03/12/2023    1:53 PM  Vitals with BMI  Height  5\' 5"    Weight 221 lbs 13 oz 208 lbs 6 oz 211 lbs 10 oz  BMI  34.68   Systolic 121 122 829  Diastolic 82  84 70  Pulse 70 56 65     Physical Exam  Gen: Alert, well appearing.  Patient is oriented to person, place, time, and situation. AFFECT: pleasant, lucid thought and speech. CV: irreg irreg, no murmur Lungs are clear to auscultation bilaterally, breathing is nonlabored. Extremities show 1-2+ bilateral lower extremity edema.  LABS:  Last CBC Lab Results  Component Value Date   WBC 6.6 03/12/2023   HGB 16.2 03/12/2023   HCT 47.3 03/12/2023   MCV 92.2 03/12/2023   MCH 31.6 12/07/2015   RDW 14.0 03/12/2023   PLT 196.0 03/12/2023   Last metabolic panel Lab Results  Component Value Date   GLUCOSE 92 03/12/2023   NA 137 03/12/2023   K 4.1 03/12/2023   CL 101 03/12/2023   CO2 27 03/12/2023   BUN 18 03/12/2023   CREATININE 1.33 03/12/2023   GFR 51.51 (L) 03/12/2023   CALCIUM 9.5 03/12/2023   PROT 7.0 03/12/2023   ALBUMIN 4.4 03/12/2023   BILITOT 0.9 03/12/2023   ALKPHOS 50 03/12/2023   AST 17 03/12/2023   ALT 16 03/12/2023   Last lipids Lab Results  Component Value Date   CHOL 174 09/15/2022   HDL 35.10 (L) 09/15/2022   LDLCALC 112 (H) 09/15/2022   LDLDIRECT 129.0 03/24/2022   TRIG 137.0 09/15/2022   CHOLHDL 5 09/15/2022   Last hemoglobin A1c Lab Results  Component Value Date   HGBA1C 5.7 (A) 03/12/2023   HGBA1C 5.7 03/12/2023   HGBA1C 5.7 03/12/2023   HGBA1C 5.7 03/12/2023   Lab Results  Component Value Date   TESTOSTERONE 296.46 (L) 09/15/2022   Last thyroid functions Lab Results  Component Value Date   TSH 1.53 03/12/2023   IMPRESSION AND PLAN:  #1  hypertension, well-controlled on amlodipine 10 mg a day, bisoprolol/HCTZ 5/6.251 tab daily, HCTZ 12.5 mg daily, and valsartan 320 mg a day. Electrolytes and creatinine today.   #2 chronic atrial fibrillation. Asymptomatic.  Continue bisoprolol-HCTZ 5-6.25, 1 tab daily. He has not had to use any of his as needed low-dose/short acting diltiazem.   #3 hypogonadism. He has felt a little bit of benefit from being on testosterone gel.  Continue 3 pumps bilateral 3 days a week. Testosterone level and hemoglobin level today.   #4 hypercholesterolemia. He does not want trial of any cholesterol-lowering medication. Will forego lipid testing today.  #5 prediabetes.  Maximum A1c was 6.2% 1 year ago. It was improved to 5.7% about 6 months ago. Hemoglobin A1c today.  An After Visit Summary was printed and given to the patient.  FOLLOW UP: Return in about 6 months (around 03/17/2024) for annual CPE (fasting).  Signed:  Santiago Bumpers, MD           09/18/2023

## 2023-09-18 ENCOUNTER — Encounter: Payer: Self-pay | Admitting: Family Medicine

## 2023-09-18 ENCOUNTER — Other Ambulatory Visit (HOSPITAL_COMMUNITY): Payer: Self-pay

## 2023-09-18 ENCOUNTER — Other Ambulatory Visit: Payer: Self-pay

## 2023-09-18 ENCOUNTER — Other Ambulatory Visit: Payer: Self-pay | Admitting: Family Medicine

## 2023-09-18 ENCOUNTER — Ambulatory Visit: Payer: PPO | Admitting: Family Medicine

## 2023-09-18 VITALS — BP 121/82 | HR 70 | Wt 221.8 lb

## 2023-09-18 DIAGNOSIS — E291 Testicular hypofunction: Secondary | ICD-10-CM

## 2023-09-18 DIAGNOSIS — I482 Chronic atrial fibrillation, unspecified: Secondary | ICD-10-CM | POA: Diagnosis not present

## 2023-09-18 DIAGNOSIS — R7303 Prediabetes: Secondary | ICD-10-CM | POA: Diagnosis not present

## 2023-09-18 DIAGNOSIS — I10 Essential (primary) hypertension: Secondary | ICD-10-CM

## 2023-09-18 LAB — CBC
HCT: 52.2 % — ABNORMAL HIGH (ref 39.0–52.0)
Hemoglobin: 17.6 g/dL — ABNORMAL HIGH (ref 13.0–17.0)
MCHC: 33.7 g/dL (ref 30.0–36.0)
MCV: 94.1 fL (ref 78.0–100.0)
Platelets: 176 10*3/uL (ref 150.0–400.0)
RBC: 5.55 Mil/uL (ref 4.22–5.81)
RDW: 14.1 % (ref 11.5–15.5)
WBC: 6 10*3/uL (ref 4.0–10.5)

## 2023-09-18 LAB — BASIC METABOLIC PANEL
BUN: 19 mg/dL (ref 6–23)
CO2: 32 meq/L (ref 19–32)
Calcium: 9.3 mg/dL (ref 8.4–10.5)
Chloride: 99 meq/L (ref 96–112)
Creatinine, Ser: 1.14 mg/dL (ref 0.40–1.50)
GFR: 61.76 mL/min (ref >60.00)
Glucose, Bld: 120 mg/dL — ABNORMAL HIGH (ref 70–99)
Potassium: 4.3 meq/L (ref 3.5–5.1)
Sodium: 140 meq/L (ref 135–145)

## 2023-09-18 LAB — HEMOGLOBIN A1C: Hgb A1c MFr Bld: 6.6 % — ABNORMAL HIGH (ref 4.6–6.5)

## 2023-09-18 LAB — TESTOSTERONE: Testosterone: 142.31 ng/dL — ABNORMAL LOW (ref 300.00–890.00)

## 2023-09-18 MED ORDER — RABEPRAZOLE SODIUM 20 MG PO TBEC
20.0000 mg | DELAYED_RELEASE_TABLET | Freq: Two times a day (BID) | ORAL | 0 refills | Status: DC
  Filled 2023-09-18: qty 180, 90d supply, fill #0

## 2023-09-18 MED ORDER — HYDROCHLOROTHIAZIDE 12.5 MG PO CAPS
12.5000 mg | ORAL_CAPSULE | Freq: Every day | ORAL | 3 refills | Status: DC
  Filled 2023-09-18: qty 90, 90d supply, fill #0
  Filled 2023-12-31: qty 90, 90d supply, fill #1
  Filled 2024-04-15: qty 90, 90d supply, fill #2
  Filled 2024-06-25: qty 90, 90d supply, fill #3

## 2023-09-18 MED ORDER — AMLODIPINE BESYLATE 10 MG PO TABS
10.0000 mg | ORAL_TABLET | Freq: Every day | ORAL | 3 refills | Status: DC
  Filled 2023-09-18: qty 90, 90d supply, fill #0
  Filled 2023-12-21 – 2023-12-25 (×2): qty 90, 90d supply, fill #1
  Filled 2024-03-17 – 2024-03-18 (×3): qty 90, 90d supply, fill #2
  Filled 2024-06-14 – 2024-06-16 (×2): qty 90, 90d supply, fill #3

## 2023-09-19 ENCOUNTER — Other Ambulatory Visit: Payer: Self-pay

## 2023-09-19 ENCOUNTER — Encounter: Payer: Self-pay | Admitting: Family Medicine

## 2023-09-19 NOTE — Progress Notes (Signed)
Ok,noted

## 2023-09-20 ENCOUNTER — Other Ambulatory Visit (HOSPITAL_COMMUNITY): Payer: Self-pay

## 2023-09-20 ENCOUNTER — Telehealth: Payer: Self-pay

## 2023-09-20 ENCOUNTER — Other Ambulatory Visit: Payer: Self-pay

## 2023-09-20 MED ORDER — WARFARIN SODIUM 2.5 MG PO TABS
3.7500 mg | ORAL_TABLET | Freq: Every day | ORAL | 3 refills | Status: AC
  Filled 2023-09-20: qty 135, 90d supply, fill #0
  Filled 2024-03-04 – 2024-03-12 (×2): qty 135, 90d supply, fill #1
  Filled 2024-06-05: qty 135, 90d supply, fill #2
  Filled 2024-09-03: qty 135, 90d supply, fill #3

## 2023-09-20 NOTE — Telephone Encounter (Signed)
Returned call. Information given for clarification,.

## 2023-09-20 NOTE — Telephone Encounter (Signed)
Copied and pasted CRM  Reason for CRM: Lokesh from [employer] is calling for clarification on RABEprazole (ACIPHEX) 20 MG tablet. Call back number is [phone] option 2.

## 2023-09-21 ENCOUNTER — Other Ambulatory Visit (HOSPITAL_COMMUNITY): Payer: Self-pay

## 2023-09-21 ENCOUNTER — Telehealth: Payer: Self-pay

## 2023-09-21 NOTE — Telephone Encounter (Signed)
Pharmacy Patient Advocate Encounter  Received notification from [facility] [employer] that Prior Authorization for RABEprazole Sodium 20MG  dr tablets  has been DENIED.  There is a previously denied request on file, please contact [employer] for further assistance   PA #/Case ID/Reference #: There is a previously denied request on file, please contact [employer] for further assistance

## 2023-09-23 ENCOUNTER — Encounter: Payer: Self-pay | Admitting: Family Medicine

## 2023-09-24 NOTE — Telephone Encounter (Signed)
I will be glad to prescribe him an alternative medication but I do not know what his insurer prefers. Can you ask our clinical pharmacy staff to help with this question?

## 2023-09-25 ENCOUNTER — Other Ambulatory Visit (HOSPITAL_COMMUNITY): Payer: Self-pay

## 2023-09-28 ENCOUNTER — Other Ambulatory Visit (HOSPITAL_COMMUNITY): Payer: Self-pay

## 2023-09-28 ENCOUNTER — Other Ambulatory Visit: Payer: Self-pay

## 2023-09-28 MED ORDER — OMEPRAZOLE 40 MG PO CPDR
40.0000 mg | DELAYED_RELEASE_CAPSULE | Freq: Every day | ORAL | 3 refills | Status: DC
  Filled 2023-09-28: qty 90, 90d supply, fill #0
  Filled 2023-12-03 – 2023-12-25 (×3): qty 90, 90d supply, fill #1
  Filled 2024-03-17 – 2024-03-18 (×3): qty 90, 90d supply, fill #2
  Filled 2024-06-14 – 2024-06-16 (×2): qty 90, 90d supply, fill #3

## 2023-09-28 NOTE — Telephone Encounter (Signed)
Called and spoke with pt. Pt stated he would be sending [place] the alternatives through MyChart.

## 2023-09-28 NOTE — Telephone Encounter (Signed)
Ok, omeprazole rx sent

## 2023-09-29 ENCOUNTER — Other Ambulatory Visit (HOSPITAL_COMMUNITY): Payer: Self-pay

## 2023-10-01 ENCOUNTER — Other Ambulatory Visit: Payer: Self-pay

## 2023-10-01 DIAGNOSIS — Z7901 Long term (current) use of anticoagulants: Secondary | ICD-10-CM | POA: Diagnosis not present

## 2023-10-01 DIAGNOSIS — I482 Chronic atrial fibrillation, unspecified: Secondary | ICD-10-CM | POA: Diagnosis not present

## 2023-10-01 DIAGNOSIS — Z5181 Encounter for therapeutic drug level monitoring: Secondary | ICD-10-CM | POA: Diagnosis not present

## 2023-10-02 ENCOUNTER — Other Ambulatory Visit (HOSPITAL_COMMUNITY): Payer: Self-pay

## 2023-10-08 ENCOUNTER — Other Ambulatory Visit: Payer: Self-pay

## 2023-10-08 ENCOUNTER — Other Ambulatory Visit: Payer: Self-pay | Admitting: Family Medicine

## 2023-10-08 ENCOUNTER — Other Ambulatory Visit (HOSPITAL_COMMUNITY): Payer: Self-pay

## 2023-10-08 MED ORDER — ALBUTEROL SULFATE HFA 108 (90 BASE) MCG/ACT IN AERS
2.0000 | INHALATION_SPRAY | RESPIRATORY_TRACT | 0 refills | Status: DC | PRN
  Filled 2023-10-08: qty 6.7, 16d supply, fill #0

## 2023-10-29 DIAGNOSIS — Z7901 Long term (current) use of anticoagulants: Secondary | ICD-10-CM | POA: Diagnosis not present

## 2023-10-29 DIAGNOSIS — Z5181 Encounter for therapeutic drug level monitoring: Secondary | ICD-10-CM | POA: Diagnosis not present

## 2023-10-29 DIAGNOSIS — I4891 Unspecified atrial fibrillation: Secondary | ICD-10-CM | POA: Diagnosis not present

## 2023-11-15 ENCOUNTER — Telehealth: Payer: Self-pay

## 2023-11-15 NOTE — Telephone Encounter (Signed)
 Reason for CRM: Patient spouse called to request a handicap placard for the patient, because he recently saw a podiatrist and is having difficulty walking.

## 2023-11-16 NOTE — Telephone Encounter (Signed)
 Form completed and pt confirmed he would like form mailed to him. Copy obtained for file copy and original to be mailed.

## 2023-11-19 DIAGNOSIS — M25552 Pain in left hip: Secondary | ICD-10-CM | POA: Diagnosis not present

## 2023-11-19 DIAGNOSIS — G609 Hereditary and idiopathic neuropathy, unspecified: Secondary | ICD-10-CM | POA: Diagnosis not present

## 2023-11-21 DIAGNOSIS — H02834 Dermatochalasis of left upper eyelid: Secondary | ICD-10-CM | POA: Diagnosis not present

## 2023-11-21 DIAGNOSIS — H02831 Dermatochalasis of right upper eyelid: Secondary | ICD-10-CM | POA: Diagnosis not present

## 2023-11-21 DIAGNOSIS — Z961 Presence of intraocular lens: Secondary | ICD-10-CM | POA: Diagnosis not present

## 2023-12-03 ENCOUNTER — Other Ambulatory Visit: Payer: Self-pay

## 2023-12-03 ENCOUNTER — Other Ambulatory Visit (HOSPITAL_COMMUNITY): Payer: Self-pay

## 2023-12-04 ENCOUNTER — Other Ambulatory Visit: Payer: Self-pay

## 2023-12-06 DIAGNOSIS — M160 Bilateral primary osteoarthritis of hip: Secondary | ICD-10-CM | POA: Diagnosis not present

## 2023-12-06 DIAGNOSIS — M533 Sacrococcygeal disorders, not elsewhere classified: Secondary | ICD-10-CM | POA: Diagnosis not present

## 2023-12-06 DIAGNOSIS — M25552 Pain in left hip: Secondary | ICD-10-CM | POA: Diagnosis not present

## 2023-12-06 DIAGNOSIS — I4891 Unspecified atrial fibrillation: Secondary | ICD-10-CM | POA: Diagnosis not present

## 2023-12-06 DIAGNOSIS — Z5181 Encounter for therapeutic drug level monitoring: Secondary | ICD-10-CM | POA: Diagnosis not present

## 2023-12-06 DIAGNOSIS — Z7901 Long term (current) use of anticoagulants: Secondary | ICD-10-CM | POA: Diagnosis not present

## 2023-12-22 ENCOUNTER — Other Ambulatory Visit (HOSPITAL_COMMUNITY): Payer: Self-pay

## 2023-12-25 ENCOUNTER — Other Ambulatory Visit (HOSPITAL_COMMUNITY): Payer: Self-pay

## 2023-12-26 ENCOUNTER — Other Ambulatory Visit (HOSPITAL_COMMUNITY): Payer: Self-pay

## 2023-12-31 ENCOUNTER — Other Ambulatory Visit: Payer: Self-pay | Admitting: Family Medicine

## 2023-12-31 ENCOUNTER — Other Ambulatory Visit (HOSPITAL_COMMUNITY): Payer: Self-pay

## 2023-12-31 ENCOUNTER — Other Ambulatory Visit: Payer: Self-pay

## 2023-12-31 MED ORDER — VALSARTAN 320 MG PO TABS
320.0000 mg | ORAL_TABLET | Freq: Every day | ORAL | 0 refills | Status: DC
Start: 2023-12-31 — End: 2024-03-17
  Filled 2023-12-31: qty 90, 90d supply, fill #0

## 2024-01-15 DIAGNOSIS — M25552 Pain in left hip: Secondary | ICD-10-CM | POA: Diagnosis not present

## 2024-01-15 DIAGNOSIS — R200 Anesthesia of skin: Secondary | ICD-10-CM | POA: Diagnosis not present

## 2024-01-22 DIAGNOSIS — I4891 Unspecified atrial fibrillation: Secondary | ICD-10-CM | POA: Diagnosis not present

## 2024-01-22 DIAGNOSIS — Z7901 Long term (current) use of anticoagulants: Secondary | ICD-10-CM | POA: Diagnosis not present

## 2024-01-22 DIAGNOSIS — Z5181 Encounter for therapeutic drug level monitoring: Secondary | ICD-10-CM | POA: Diagnosis not present

## 2024-02-21 ENCOUNTER — Telehealth: Payer: Self-pay

## 2024-02-21 ENCOUNTER — Ambulatory Visit: Payer: Self-pay

## 2024-02-21 DIAGNOSIS — S70261A Insect bite (nonvenomous), right hip, initial encounter: Secondary | ICD-10-CM | POA: Diagnosis not present

## 2024-02-21 DIAGNOSIS — L03115 Cellulitis of right lower limb: Secondary | ICD-10-CM | POA: Diagnosis not present

## 2024-02-21 NOTE — Telephone Encounter (Signed)
 Please disregard previous message about tick bite, pt was seen at urgent care

## 2024-02-21 NOTE — Telephone Encounter (Signed)
 FYI Only or Action Required?: Action required by provider: clinical question for provider.  Patient was last seen in primary care on 09/18/2023 by McGowen, Aleene DEL, MD. Called Nurse Triage reporting Tick Removal. Symptoms began yesterday. Interventions attempted: Rest, hydration, or home remedies. Symptoms are: unchanged.  Triage Disposition: See Physician Within 24 Hours-needing follow up from office.   Patient/caregiver understands and will follow disposition?: No, wishes to speak with PCP  Copied from CRM [id number]. Topic: Clinical - Red Word Triage >> Feb 21, 2024 11:02 AM Gennette ORN wrote: Red Word that prompted transfer to Nurse Triage: Patient is calling in because he has swelling going on it's red and also hard as well. Reason for Disposition  [1] 2 to 14 days following tick bite AND [2] widespread rash or headache AND [3] no fever  Answer Assessment - Initial Assessment Questions 1. ATTACHED:  Is the tick still on the skin?  (e.g., yes, no, unsure)     Not still attached 2. ONSET - TICK STILL ATTACHED:  How long do you think the tick has been on your skin? (e.g., hours, days, unsure)  Note:  Is there a recent activity (camping, hiking) where the caller may have been exposed?     N/A 3. ONSET - TICK NOT STILL ATTACHED: If the tick has been removed, how long do you think the tick was attached before you removed it? (e.g., 5 hours, 2 days). When was this?     Patient removed two ticks off his body yesterday. Patient reports a tick had to be removed from right hip 4. LOCATION: Where is the tick bite located? (e.g., arm, leg)     Right hip 5. TYPE of TICK: Is it a wood tick or a deer tick? (e.g., deer tick, wood tick; unsure)     Unsure of what type of tick 6. SIZE of TICK: How big is the tick? (e.g., size of poppy seed, apple seed, watermelon seed; unsure) Note: Deer ticks can be the size of a poppy seed (nymph) or an apple seed (adult).       unsure 7. ENGORGED: Did the  tick look flat or engorged (full, swollen)? (e.g., flat, engorged; unsure)     unsure 8. OTHER SYMPTOMS: Do you have any other symptoms? (e.g., fever, rash, redness at bite area, red ring around bite)     Swelling, redness to bite area, itching  Wife reports site is larger than a quarter but not as big as a half dollar.  Protocols used: Tick Bite-A-AH

## 2024-02-21 NOTE — Telephone Encounter (Signed)
 Spoke with pt and advised an office visit would be likely be needed. There are no available appts tomorrow. Please advise

## 2024-02-25 DIAGNOSIS — Z7901 Long term (current) use of anticoagulants: Secondary | ICD-10-CM | POA: Diagnosis not present

## 2024-02-25 DIAGNOSIS — I480 Paroxysmal atrial fibrillation: Secondary | ICD-10-CM | POA: Diagnosis not present

## 2024-02-27 ENCOUNTER — Ambulatory Visit: Admitting: *Deleted

## 2024-02-27 VITALS — Ht 65.0 in | Wt 215.0 lb

## 2024-02-27 DIAGNOSIS — Z0000 Encounter for general adult medical examination without abnormal findings: Secondary | ICD-10-CM | POA: Diagnosis not present

## 2024-02-27 NOTE — Patient Instructions (Signed)
 [name] , Thank you for taking time to come for your Medicare Wellness Visit. I appreciate your ongoing commitment to your health goals. Please review the following plan we discussed and let me know if I can assist you in the future.   Screening recommendations/referrals: Colonoscopy: no longer required Recommended yearly ophthalmology/optometry visit for glaucoma screening and checkup Recommended yearly dental visit for hygiene and checkup  Vaccinations: Influenza vaccine: up to date Pneumococcal vaccine: up to date Tdap vaccine: up to date Shingles vaccine: 1 of 2       Preventive Care 65 Years and Older, Male Preventive care refers to lifestyle choices and visits with your health care provider that can promote health and wellness. What does preventive care include? A yearly physical exam. This is also called an annual well check. Dental exams once or twice a year. Routine eye exams. Ask your health care provider how often you should have your eyes checked. Personal lifestyle choices, including: Daily care of your teeth and gums. Regular physical activity. Eating a healthy diet. Avoiding tobacco and drug use. Limiting alcohol use. Practicing safe sex. Taking low doses of aspirin every day. Taking vitamin and mineral supplements as recommended by your health care provider. What happens during an annual well check? The services and screenings done by your health care provider during your annual well check will depend on your age, overall health, lifestyle risk factors, and family history of disease. Counseling  Your health care provider may ask you questions about your: Alcohol use. Tobacco use. Drug use. Emotional well-being. Home and relationship well-being. Sexual activity. Eating habits. History of falls. Memory and ability to understand (cognition). Work and work Astronomer. Screening  You may have the following tests or measurements: Height, weight, and  BMI. Blood pressure. Lipid and cholesterol levels. These may be checked every 5 years, or more frequently if you are over 61 years old. Skin check. Lung cancer screening. You may have this screening every year starting at age 89 if you have a 30-pack-year history of smoking and currently smoke or have quit within the past 15 years. Fecal occult blood test (FOBT) of the stool. You may have this test every year starting at age 61. Flexible sigmoidoscopy or colonoscopy. You may have a sigmoidoscopy every 5 years or a colonoscopy every 10 years starting at age 63. Prostate cancer screening. Recommendations will vary depending on your family history and other risks. Hepatitis C blood test. Hepatitis B blood test. Sexually transmitted disease (STD) testing. Diabetes screening. This is done by checking your blood sugar (glucose) after you have not eaten for a while (fasting). You may have this done every 1-3 years. Abdominal aortic aneurysm (AAA) screening. You may need this if you are a current or former smoker. Osteoporosis. You may be screened starting at age 59 if you are at high risk. Talk with your health care provider about your test results, treatment options, and if necessary, the need for more tests. Vaccines  Your health care provider may recommend certain vaccines, such as: Influenza vaccine. This is recommended every year. Tetanus, diphtheria, and acellular pertussis (Tdap, Td) vaccine. You may need a Td booster every 10 years. Zoster vaccine. You may need this after age 66. Pneumococcal 13-valent conjugate (PCV13) vaccine. One dose is recommended after age 34. Pneumococcal polysaccharide (PPSV23) vaccine. One dose is recommended after age 83. Talk to your health care provider about which screenings and vaccines you need and how often you need them. This information is not  intended to replace advice given to you by your health care provider. Make sure you discuss any questions you have  with your health care provider. Document Released: 09/10/2015 Document Revised: 05/03/2016 Document Reviewed: 06/15/2015 Elsevier Interactive Patient Education  2017 [employer].  Fall Prevention in the Home Falls can cause injuries. They can happen to people of all ages. There are many things you can do to make your home safe and to help prevent falls. What can I do on the outside of my home? Regularly fix the edges of walkways and driveways and fix any cracks. Remove anything that might make you trip as you walk through a door, such as a raised step or threshold. Trim any bushes or trees on the path to your home. Use bright outdoor lighting. Clear any walking paths of anything that might make someone trip, such as rocks or tools. Regularly check to see if handrails are loose or broken. Make sure that both sides of any steps have handrails. Any raised decks and porches should have guardrails on the edges. Have any leaves, snow, or ice cleared regularly. Use sand or salt on walking paths during winter. Clean up any spills in your garage right away. This includes oil or grease spills. What can I do in the bathroom? Use night lights. Install grab bars by the toilet and in the tub and shower. Do not use towel bars as grab bars. Use non-skid mats or decals in the tub or shower. If you need to sit down in the shower, use a plastic, non-slip stool. Keep the floor dry. Clean up any water that spills on the floor as soon as it happens. Remove soap buildup in the tub or shower regularly. Attach bath mats securely with double-sided non-slip rug tape. Do not have throw rugs and other things on the floor that can make you trip. What can I do in the bedroom? Use night lights. Make sure that you have a light by your bed that is easy to reach. Do not use any sheets or blankets that are too big for your bed. They should not hang down onto the floor. Have a firm chair that has side arms. You can use  this for support while you get dressed. Do not have throw rugs and other things on the floor that can make you trip. What can I do in the kitchen? Clean up any spills right away. Avoid walking on wet floors. Keep items that you use a lot in easy-to-reach places. If you need to reach something above you, use a strong step stool that has a grab bar. Keep electrical cords out of the way. Do not use floor polish or wax that makes floors slippery. If you must use wax, use non-skid floor wax. Do not have throw rugs and other things on the floor that can make you trip. What can I do with my stairs? Do not leave any items on the stairs. Make sure that there are handrails on both sides of the stairs and use them. Fix handrails that are broken or loose. Make sure that handrails are as long as the stairways. Check any carpeting to make sure that it is firmly attached to the stairs. Fix any carpet that is loose or worn. Avoid having throw rugs at the top or bottom of the stairs. If you do have throw rugs, attach them to the floor with carpet tape. Make sure that you have a light switch at the top of the stairs  and the bottom of the stairs. If you do not have them, ask someone to add them for you. What else can I do to help prevent falls? Wear shoes that: Do not have high heels. Have rubber bottoms. Are comfortable and fit you well. Are closed at the toe. Do not wear sandals. If you use a stepladder: Make sure that it is fully opened. Do not climb a closed stepladder. Make sure that both sides of the stepladder are locked into place. Ask someone to hold it for you, if possible. Clearly mark and make sure that you can see: Any grab bars or handrails. First and last steps. Where the edge of each step is. Use tools that help you move around (mobility aids) if they are needed. These include: Canes. Walkers. Scooters. Crutches. Turn on the lights when you go into a dark area. Replace any light bulbs  as soon as they burn out. Set up your furniture so you have a clear path. Avoid moving your furniture around. If any of your floors are uneven, fix them. If there are any pets around you, be aware of where they are. Review your medicines with your doctor. Some medicines can make you feel dizzy. This can increase your chance of falling. Ask your doctor what other things that you can do to help prevent falls. This information is not intended to replace advice given to you by your health care provider. Make sure you discuss any questions you have with your health care provider. Document Released: 06/10/2009 Document Revised: 01/20/2016 Document Reviewed: 09/18/2014 Elsevier Interactive Patient Education  2017 [employer].

## 2024-02-27 NOTE — Progress Notes (Signed)
 Subjective:   Jeffrey Little is a 78 y.o. male who presents for Medicare Annual/Subsequent preventive examination.  Visit Complete: Virtual I connected with  Garen FORBES Misty on 02/27/24 by a audio enabled telemedicine application and verified that I am speaking with the correct person using two identifiers.  Patient Location: Home  Provider Location: Home Office  I discussed the limitations of evaluation and management by telemedicine. The patient expressed understanding and agreed to proceed.  Vital Signs: Because this visit was a virtual/telehealth visit, some criteria may be missing or patient reported. Any vitals not documented were not able to be obtained and vitals that have been documented are patient reported.   Cardiac Risk Factors include: advanced age (>81men, >80 women);male gender;hypertension;obesity (BMI >30kg/m2)     Objective:    Today's Vitals   02/27/24 0901  Weight: 215 lb (97.5 kg)  Height: 5' 5 (1.651 m)   Body mass index is 35.78 kg/m.     02/27/2024    8:57 AM 01/31/2023    8:09 AM 01/25/2022    8:09 AM  Advanced Directives  Does Patient Have a Medical Advance Directive? Yes Yes Yes  Type of Estate agent of [employer] Power of [place];Living will Healthcare Power of Attorney  Copy of Healthcare Power of Attorney in Chart? No - copy requested No - copy requested No - copy requested    Current Medications (verified) Outpatient Encounter Medications as of 02/27/2024  Medication Sig   albuterol  (VENTOLIN  HFA) 108 (90 Base) MCG/ACT inhaler Inhale 2 puffs into the lungs every 4 (four) hours as needed for wheezing or shortness of breath.   amLODipine  (NORVASC ) 10 MG tablet Take 1 tablet (10 mg total) by mouth daily.   bisoprolol -hydrochlorothiazide  (ZIAC ) 5-6.25 MG tablet Take 1 tablet by mouth daily.   Calcium Carbonate-Vitamin D 600-200 MG-UNIT TABS Take 1 tablet by mouth 2 (two) times daily.   fluticasone  (FLONASE ) 50  MCG/ACT nasal spray Place 2 sprays into both nostrils daily.   Glucosamine-Chondroit-Vit C-Mn (GLUCOSAMINE-CHONDROITIN MAX ST PO) Take 2 capsules by mouth daily.    hydrochlorothiazide  (MICROZIDE ) 12.5 MG capsule Take 1 capsule (12.5 mg total) by mouth daily.   ipratropium (ATROVENT ) 0.03 % nasal spray Place 2 sprays into both nostrils every 12 (twelve) hours for 3 days as needed for runny nose (Patient taking differently: Place 2 sprays into both nostrils every 12 (twelve) hours. As needed)   Multiple Vitamins-Minerals (MENS MULTIVITAMIN PLUS PO) Take 1 tablet by mouth daily.   omeprazole  (PRILOSEC) 40 MG capsule Take 1 capsule (40 mg total) by mouth daily.   Testosterone  12.5 MG/ACT (1%) GEL apply 3 pumps onto THE SKIN three DAYS PER WEEK   valsartan  (DIOVAN ) 320 MG tablet Take 1 tablet (320 mg total) by mouth daily.   warfarin (COUMADIN ) 2.5 MG tablet Take by mouth.   warfarin (COUMADIN ) 2.5 MG tablet Take up to 1.5 tablets (3.75 mg total) by mouth daily or as directed.   diltiazem  (CARDIZEM ) 30 MG tablet 1 tab po bid AS NEEDED for rapid heart rate (Patient not taking: Reported on 02/27/2024)   No facility-administered encounter medications on file as of 02/27/2024.    Allergies (verified) Milk (cow), Atorvastatin, and Pollen extract   History: Past Medical History:  Diagnosis Date   BPH (benign prostatic hyperplasia)    Cataract    Right; impairing vision as of 05/2017. (has ophthalmologist)   Chronic renal insufficiency, stage II (mild)    CrCl in the 60s  Coronary artery disease    Diabetes mellitus without complication, without long-term current use of insulin (HCC)    89. A1c was 5.9%. Stable at 6.0% 05/2016. A1c 6.0% 11/2017. 5.9% 08/2019.  Rose to 6.7% Jan 2025   GERD (gastroesophageal reflux disease)    Hearing loss    In a study at [facility] as of 2019   Hx of colonic polyps    03/2017 and 05/2021--adenomatous.  Recall 3 yrs. (Dig [facility] spec)   Hypercholesteremia    Pt refuses  statins (as of 05/2016). Declined again 02/2019.   Hypertension    IBS (irritable bowel syndrome)    OSA on CPAP    Verneita Day, NP at Neurology-Sleep Medicine at [facility]   Peripheral vascular disease [facility])    Permanent atrial fibrillation (HCC) 2001   failed DCCV per pt.  Rate control + coumadin --monitored by cardiologist.   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  Colchicine started by UC   Polycythemia    Pt states he had w/u for hemachromatosis but it was NEG.  As of 05/2016, his polycythemia is presumably due to testosterone  effect.   Prostate cancer screening    Pt declines any further prostate ca screening as of 05/2015.   Reactive airway disease    Scoliosis    Testosterone  deficiency    Past Surgical History:  Procedure Laterality Date   aortic ultrasound  09/2019   AAA screening; prox aorta 3cm->ULN, no aneurism.   CARDIAC CATHETERIZATION  approx 2001   nonobstruc CAD   CIRCUMCISION     COLONOSCOPY W/ POLYPECTOMY  05/12/11; 04/27/17   2018 and 05/2021--adenomatous polyp; repeat 3 yrs   NASAL POLYP SURGERY     SKIN CANCER EXCISION     Non-malanoma   TRANSTHORACIC ECHOCARDIOGRAM  10/2019   EF 55-60%, nl LV syst fxn, severe LA and RA dilation, valves fine.   UPPER GI ENDOSCOPY  10/07/2021   normal except gastric polyps->Dr. Gregory.  Pt was then changed from protonix  to aciphex    VASECTOMY  1983   VPAP  03/2013   14/4, EEP=11   History reviewed. No pertinent family history. Social History   Socioeconomic History   Marital status: Married    Spouse name: Not on file   Number of children: 2   Years of education: Not on file   Highest education level: Not on file  Occupational History   Occupation: retired  Tobacco Use   Smoking status: Former    Types: Cigars    Quit date: 08/28/1974    Years since quitting: 49.5   Smokeless tobacco: Never  Substance and Sexual Activity   Alcohol use: No   Drug use: No   Sexual activity:  Yes  Other Topics Concern   Not on file  Social History Narrative   Married, 2 sons, 4 grandchildren.   Orig from [place].   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   No cigs, no alcohol, no drugs.   No exercise.   Social Drivers of Corporate investment banker Strain: Low Risk  (02/27/2024)   Overall Financial Resource Strain (CARDIA)    Difficulty of Paying Living Expenses: Not hard at all  Food Insecurity: No Food Insecurity (02/27/2024)   Hunger Vital Sign    Worried About Running Out of Food in the Last Year: Never true    Ran Out of Food in the Last Year: Never true  Transportation Needs: No Transportation Needs (02/27/2024)   PRAPARE - Administrator, Civil Service (Medical): No    Lack of Transportation (Non-Medical): No  Physical Activity: Inactive (02/27/2024)   Exercise Vital Sign    Days of Exercise per Week: 0 days    Minutes of Exercise per Session: 0 min  Stress: No Stress Concern Present (02/27/2024)   [employer] of Occupational Health - Occupational Stress Questionnaire    Feeling of Stress: Not at all  Social Connections: Socially Integrated (02/27/2024)   Social Connection and Isolation Panel    Frequency of Communication with Friends and Family: More than three times a week    Frequency of Social Gatherings with Friends and Family: Three times a week    Attends Religious Services: More than 4 times per year    Active Member of Clubs or Organizations: Yes    Attends Engineer, structural: More than 4 times per year    Marital Status: Married    Tobacco Counseling Counseling given: Not Answered   Clinical Intake:  Pre-visit preparation completed: Yes  Pain : No/denies pain     Diabetes: No  How often do you need to have someone help you when you read instructions, pamphlets, or other written materials from your doctor or pharmacy?: 1 - Never  Interpreter Needed?: No  Information  entered by :: Mliss Graff LPN   Activities of Daily Living    02/27/2024    9:00 AM  In your present state of health, do you have any difficulty performing the following activities:  Hearing? 1  Vision? 0  Difficulty concentrating or making decisions? 0  Walking or climbing stairs? 0  Dressing or bathing? 0  Doing errands, shopping? 0  Preparing Food and eating ? N  Using the Toilet? N  In the past six months, have you accidently leaked urine? N  Do you have problems with loss of bowel control? N  Managing your Medications? N  Managing your Finances? N  Housekeeping or managing your Housekeeping? N    Patient Care Team: Candise Aleene DEL, MD as PCP - General (Family Medicine) Missie Balling, MD as Consulting Physician (Gastroenterology) Ceasar Shaver, MD as Consulting Physician (Dermatology) Marcine Koren Fallow, MD as Consulting Physician (Cardiology) Gregory Kent, MD as Consulting Physician (Gastroenterology)  Indicate any recent Medical Services you may have received from other than [facility] providers in the past year (date may be approximate).     Assessment:   This is a routine wellness examination for Evonte.  Hearing/Vision screen Hearing Screening - Comments:: Is in a hearing sturdy at [facility] Has bluetooth  hearing aids bilateral Vision Screening - Comments:: Up to date [facility] Unsure of name   Goals Addressed             This Visit's Progress    Patient Stated   On track    Eat healthier & increase activity     Patient Stated   Not on track    Lose weight      Patient Stated   On track    Stay healthy      Patient Stated       Continue current lifestyle       Depression Screen    02/27/2024    9:03 AM 09/18/2023    8:13 AM 03/12/2023    2:00 PM 01/31/2023    8:08 AM 09/15/2022    8:16 AM 06/15/2022    9:11 AM 01/25/2022  8:08 AM  PHQ 2/9 Scores  PHQ - 2 Score 0 2 0 0 3 0 0  PHQ- 9 Score 0 3 0  5      Fall Risk     02/27/2024    8:53 AM 09/18/2023    8:13 AM 03/12/2023    2:00 PM 01/31/2023    8:10 AM 06/15/2022    9:11 AM  Fall Risk   Falls in the past year? 0 0 0 0 0  Number falls in past yr: 0   0 0  Injury with Fall? 0   0 0  Risk for fall due to :   No Fall Risks Impaired vision No Fall Risks  Follow up Falls evaluation completed;Education provided;Falls prevention discussed Falls evaluation completed Falls evaluation completed Falls prevention discussed Falls evaluation completed      Data saved with a previous flowsheet row definition    MEDICARE RISK AT HOME: Medicare Risk at Home Any stairs in or around the home?: Yes If so, are there any without handrails?: No Home free of loose throw rugs in walkways, pet beds, electrical cords, etc?: Yes Adequate lighting in your home to reduce risk of falls?: Yes Life alert?: No Use of a cane, walker or w/c?: No Grab bars in the bathroom?: Yes Shower chair or bench in shower?: Yes Elevated toilet seat or a handicapped toilet?: Yes  TIMED UP AND GO:  Was the test performed?  No    Cognitive Function:        02/27/2024    8:59 AM 01/31/2023    8:11 AM 01/25/2022    8:11 AM  6CIT Screen  What Year? 0 points 0 points 0 points  What month? 0 points 0 points 0 points  What time? 0 points 0 points 0 points  Count back from 20 0 points 0 points 0 points  Months in reverse 0 points 0 points 0 points  Repeat phrase 0 points 0 points 0 points  Total Score 0 points 0 points 0 points    Immunizations Immunization History  Administered Date(s) Administered   Fluad Quad(high Dose 65+) 07/05/2021, 05/30/2022   Influenza Split 07/29/2011, 06/27/2012   Influenza Whole 08/11/2010   Influenza, High Dose Seasonal PF 06/22/2015, 06/21/2016, 06/22/2017, 06/18/2018, 05/26/2019, 06/03/2020   Influenza,inj,Quad PF,6+ Mos 06/02/2014   Influenza-Unspecified 05/26/2019, 06/03/2020, 06/26/2023   Moderna Sars-Covid-2 Vaccination 09/29/2019, 10/28/2019, 07/30/2020,  12/08/2020   Pneumococcal Conjugate-13 06/02/2014   Pneumococcal Polysaccharide-23 08/11/2010, 06/22/2017   Tdap 09/15/2011, 11/04/2021    TDAP status: Up to date  Flu Vaccine status: Up to date  Pneumococcal vaccine status: Up to date  Covid-19 vaccine status: Information provided on how to obtain vaccines.   Qualifies for Shingles Vaccine? Yes   Zostavax completed No   Shingrix Completed?: No.    Education has been provided regarding the importance of this vaccine. Patient has been advised to call insurance company to determine out of pocket expense if they have not yet received this vaccine. Advised may also receive vaccine at local pharmacy or Health Dept. Verbalized acceptance and understanding.  Screening Tests Health Maintenance  Topic Date Due   Hepatitis C Screening  Never done   Colonoscopy  06/13/2024   COVID-19 Vaccine (5 - 2024-25 season) 03/14/2024 (Originally 04/29/2023)   Zoster Vaccines- Shingrix (1 of 2) 05/29/2024 (Originally 07/25/1965)   INFLUENZA VACCINE  03/28/2024   Medicare Annual Wellness (AWV)  02/26/2025   DTaP/Tdap/Td (3 - Td or Tdap) 11/05/2031   Pneumococcal Vaccine:  50+ Years  Completed   Hepatitis B Vaccines  Aged Out   HPV VACCINES  Aged Out   Meningococcal B Vaccine  Aged Out    Health Maintenance  Health Maintenance Due  Topic Date Due   Hepatitis C Screening  Never done   Colonoscopy  06/13/2024    Colorectal cancer screening: No longer required.   Lung Cancer Screening: (Low Dose CT Chest recommended if Age 38-80 years, 20 pack-year currently smoking OR have quit w/in 15years.) does not qualify.   Lung Cancer Screening Referral:   Additional Screening:  Hepatitis C Screening  never done  Vision Screening: Recommended annual ophthalmology exams for early detection of glaucoma and other disorders of the eye. Is the patient up to date with their annual eye exam?  Yes  Who is the provider or what is the name of the office in which  the patient attends annual eye exams? [facility]  unsure of name If pt is not established with a provider, would they like to be referred to a provider to establish care? No .   Dental Screening: Recommended annual dental exams for proper oral hygiene    Community Resource Referral / Chronic Care Management: CRR required this visit?  No   CCM required this visit?  No     Plan:     I have personally reviewed and noted the following in the patient's chart:   Medical and social history Use of alcohol, tobacco or illicit drugs  Current medications and supplements including opioid prescriptions. Patient is currently taking opioid prescriptions. Information provided to patient regarding non-opioid alternatives. Patient advised to discuss non-opioid treatment plan with their provider. Functional ability and status Nutritional status Physical activity Advanced directives List of other physicians Hospitalizations, surgeries, and ER visits in previous 12 months Vitals Screenings to include cognitive, depression, and falls Referrals and appointments  In addition, I have reviewed and discussed with patient certain preventive protocols, quality metrics, and best practice recommendations. A written personalized care plan for preventive services as well as general preventive health recommendations were provided to patient.     Mliss Graff, LPN   10/04/7972   After Visit Summary: (MyChart) Due to this being a telephonic visit, the after visit summary with patients personalized plan was offered to patient via MyChart   Nurse Notes:

## 2024-03-05 ENCOUNTER — Other Ambulatory Visit (HOSPITAL_COMMUNITY): Payer: Self-pay

## 2024-03-05 ENCOUNTER — Other Ambulatory Visit: Payer: Self-pay

## 2024-03-10 ENCOUNTER — Other Ambulatory Visit (HOSPITAL_COMMUNITY): Payer: Self-pay

## 2024-03-10 ENCOUNTER — Other Ambulatory Visit: Payer: Self-pay

## 2024-03-12 ENCOUNTER — Other Ambulatory Visit: Payer: Self-pay

## 2024-03-12 ENCOUNTER — Other Ambulatory Visit (HOSPITAL_COMMUNITY): Payer: Self-pay

## 2024-03-17 ENCOUNTER — Other Ambulatory Visit (HOSPITAL_COMMUNITY): Payer: Self-pay

## 2024-03-17 DIAGNOSIS — I482 Chronic atrial fibrillation, unspecified: Secondary | ICD-10-CM | POA: Diagnosis not present

## 2024-03-17 DIAGNOSIS — Z7901 Long term (current) use of anticoagulants: Secondary | ICD-10-CM | POA: Diagnosis not present

## 2024-03-17 DIAGNOSIS — I480 Paroxysmal atrial fibrillation: Secondary | ICD-10-CM | POA: Diagnosis not present

## 2024-03-18 ENCOUNTER — Ambulatory Visit (INDEPENDENT_AMBULATORY_CARE_PROVIDER_SITE_OTHER): Payer: PPO | Admitting: Family Medicine

## 2024-03-18 ENCOUNTER — Encounter: Payer: Self-pay | Admitting: Family Medicine

## 2024-03-18 ENCOUNTER — Other Ambulatory Visit (HOSPITAL_COMMUNITY): Payer: Self-pay

## 2024-03-18 ENCOUNTER — Other Ambulatory Visit: Payer: Self-pay

## 2024-03-18 VITALS — BP 124/69 | HR 75 | Temp 98.3°F | Ht 65.75 in | Wt 211.4 lb

## 2024-03-18 DIAGNOSIS — M7062 Trochanteric bursitis, left hip: Secondary | ICD-10-CM

## 2024-03-18 DIAGNOSIS — Z0000 Encounter for general adult medical examination without abnormal findings: Secondary | ICD-10-CM

## 2024-03-18 DIAGNOSIS — R7303 Prediabetes: Secondary | ICD-10-CM | POA: Diagnosis not present

## 2024-03-18 DIAGNOSIS — D751 Secondary polycythemia: Secondary | ICD-10-CM | POA: Diagnosis not present

## 2024-03-18 DIAGNOSIS — E7800 Pure hypercholesterolemia, unspecified: Secondary | ICD-10-CM

## 2024-03-18 DIAGNOSIS — I10 Essential (primary) hypertension: Secondary | ICD-10-CM | POA: Diagnosis not present

## 2024-03-18 DIAGNOSIS — E291 Testicular hypofunction: Secondary | ICD-10-CM

## 2024-03-18 LAB — COMPREHENSIVE METABOLIC PANEL WITH GFR
ALT: 20 U/L (ref 0–53)
AST: 21 U/L (ref 0–37)
Albumin: 4.6 g/dL (ref 3.5–5.2)
Alkaline Phosphatase: 49 U/L (ref 39–117)
BUN: 17 mg/dL (ref 6–23)
CO2: 32 meq/L (ref 19–32)
Calcium: 9.7 mg/dL (ref 8.4–10.5)
Chloride: 99 meq/L (ref 96–112)
Creatinine, Ser: 1.11 mg/dL (ref 0.40–1.50)
GFR: 63.54 mL/min (ref >60.00)
Glucose, Bld: 121 mg/dL — ABNORMAL HIGH (ref 70–99)
Potassium: 4.3 meq/L (ref 3.5–5.1)
Sodium: 139 meq/L (ref 135–145)
Total Bilirubin: 1.3 mg/dL — ABNORMAL HIGH (ref 0.2–1.2)
Total Protein: 7.3 g/dL (ref 6.0–8.3)

## 2024-03-18 LAB — LIPID PANEL
Cholesterol: 194 mg/dL (ref 0–200)
HDL: 34.7 mg/dL — ABNORMAL LOW (ref >39.00)
LDL Cholesterol: 120 mg/dL — ABNORMAL HIGH (ref 0–99)
NonHDL: 159.36
Total CHOL/HDL Ratio: 6
Triglycerides: 198 mg/dL — ABNORMAL HIGH (ref 0.0–149.0)
VLDL: 39.6 mg/dL (ref 0.0–40.0)

## 2024-03-18 LAB — CBC
HCT: 50.3 % (ref 39.0–52.0)
Hemoglobin: 17.3 g/dL — ABNORMAL HIGH (ref 13.0–17.0)
MCHC: 34.4 g/dL (ref 30.0–36.0)
MCV: 90.5 fl (ref 78.0–100.0)
Platelets: 183 K/uL (ref 150.0–400.0)
RBC: 5.55 Mil/uL (ref 4.22–5.81)
RDW: 14.1 % (ref 11.5–15.5)
WBC: 6 K/uL (ref 4.0–10.5)

## 2024-03-18 LAB — HEMOGLOBIN A1C: Hgb A1c MFr Bld: 6.5 % (ref 4.6–6.5)

## 2024-03-18 LAB — TESTOSTERONE: Testosterone: 293.47 ng/dL — ABNORMAL LOW (ref 300.00–890.00)

## 2024-03-18 MED ORDER — VALSARTAN 320 MG PO TABS
320.0000 mg | ORAL_TABLET | Freq: Every day | ORAL | 3 refills | Status: AC
  Filled 2024-03-18: qty 90, 90d supply, fill #0
  Filled 2024-06-25: qty 90, 90d supply, fill #1
  Filled 2024-09-29: qty 90, 90d supply, fill #2

## 2024-03-18 MED ORDER — BISOPROLOL-HYDROCHLOROTHIAZIDE 5-6.25 MG PO TABS
1.0000 | ORAL_TABLET | Freq: Every day | ORAL | 3 refills | Status: AC
  Filled 2024-03-18: qty 180, 180d supply, fill #0
  Filled 2024-06-05: qty 100, 100d supply, fill #0
  Filled 2024-09-03: qty 100, 100d supply, fill #1

## 2024-03-18 MED ORDER — TESTOSTERONE 12.5 MG/ACT (1%) TD GEL
TRANSDERMAL | 5 refills | Status: DC
  Filled 2024-03-18: qty 150, 90d supply, fill #0

## 2024-03-18 NOTE — Patient Instructions (Signed)
 Hip Bursitis Rehab Ask your health care provider which exercises are safe for you. Do exercises exactly as told by your health care provider and adjust them as directed. It is normal to feel mild stretching, pulling, tightness, or discomfort as you do these exercises. Stop right away if you feel sudden pain or your pain gets worse. Do not begin these exercises until told by your health care provider. Stretching exercise This exercise warms up your muscles and joints and improves the movement and flexibility of your hip. This exercise also helps to relieve pain and stiffness. Iliotibial band stretch An iliotibial band is a strong band of muscle tissue that runs from the outer side of your hip to the outer side of your thigh and knee. Lie on your side with your left / right leg in the top position. Bend your left / right knee and grab your ankle. Stretch out your bottom arm to help you balance. Slowly bring your knee back so your thigh is slightly behind your body. Slowly lower your knee toward the floor until you feel a gentle stretch on the outside of your left / right thigh. If you do not feel a stretch and your knee will not lower more toward the floor, place the heel of your other foot on top of your knee and pull your knee down toward the floor with your foot. Hold this position for __________ seconds. Slowly return to the starting position. Repeat __________ times. Complete this exercise __________ times a day. Strengthening exercises These exercises build strength and endurance in your hip and pelvis. Endurance is the ability to use your muscles for a long time, even after they get tired. Bridge This exercise strengthens the muscles that move your thigh backward (hip extensors). Lie on your back on a firm surface with your knees bent and your feet flat on the floor. Tighten your buttocks muscles and lift your buttocks off the floor until your trunk is level with your thighs. Do not arch your  back. You should feel the muscles working in your buttocks and the back of your thighs. If you do not feel these muscles, slide your feet 1-2 inches (2.5-5 cm) farther away from your buttocks. If this exercise is too easy, try doing it with your arms crossed over your chest. Hold this position for __________ seconds. Slowly lower your hips to the starting position. Let your muscles relax completely after each repetition. Repeat __________ times. Complete this exercise __________ times a day. Squats This exercise strengthens the muscles in front of your thigh and knee (quadriceps). Stand in front of a table, with your feet and knees pointing straight ahead. You may rest your hands on the table for balance but not for support. Slowly bend your knees and lower your hips like you are going to sit in a chair. Keep your weight over your heels, not over your toes. Keep your lower legs upright so they are parallel with the table legs. Do not let your hips go lower than your knees. Do not bend lower than told by your health care provider. If your hip pain increases, do not bend as low. Hold the squat position for __________ seconds. Slowly push with your legs to return to standing. Do not use your hands to pull yourself to standing. Repeat __________ times. Complete this exercise __________ times a day. Hip hike  Stand sideways on a bottom step. Stand on your left / right leg with your other foot unsupported next to  the step. You can hold on to the railing or wall for balance if needed. Keep your knees straight and your torso square. Then lift your left / right hip up toward the ceiling. Hold this position for __________ seconds. Slowly let your left / right hip lower toward the floor, past the starting position. Your foot should get closer to the floor. Do not lean or bend your knees. Repeat __________ times. Complete this exercise __________ times a day. Single leg stand This exercise increases  your balance. Without shoes, stand near a railing or in a doorway. You may hold on to the railing or door frame as needed for balance. Squeeze your left / right buttock muscles, then lift up your other foot. Do not let your left / right hip push out to the side. It is helpful to stand in front of a mirror for this exercise so you can watch your hip. Hold this position for __________ seconds. Repeat __________ times. Complete this exercise __________ times a day. This information is not intended to replace advice given to you by your health care provider. Make sure you discuss any questions you have with your health care provider. Document Revised: 07/27/2021 Document Reviewed: 07/27/2021 Elsevier Patient Education  2024 [employer].

## 2024-03-18 NOTE — Progress Notes (Signed)
 Office Note 03/18/2024  CC:  Chief Complaint  Patient presents with   Annual Exam    Pt is fasting   Patient is a 78 y.o. male who is here for annual health maintenance exam and 51-month follow-up hypertension, hypogonadism, and borderline diabetes. A/P as of last visit: #1 hypertension, well-controlled on amlodipine  10 mg a day, bisoprolol /HCTZ 5/6.251 tab daily, HCTZ 12.5 mg daily, and valsartan  320 mg a day. Electrolytes and creatinine today.   #2 chronic atrial fibrillation. Asymptomatic.  Continue bisoprolol -HCTZ 5-6.25, 1 tab daily. He has not had to use any of his as needed low-dose/short acting diltiazem .   #3 hypogonadism. He has felt a little bit of benefit from being on testosterone  gel.  Continue 3 pumps bilateral 3 days a week. Testosterone  level and hemoglobin level today.   #4 hypercholesterolemia. He does not want trial of any cholesterol-lowering medication. Will forego lipid testing today.   #5 prediabetes.  Maximum A1c was 6.2% 1 year ago. It was improved to 5.7% about 6 months ago. Hemoglobin A1c today.  INTERIM HX: Nikolis feels well other than some recent pain in the left hip.  Points to the lateral aspect at the greater troches region.  Says it feels like a pulling tightness, sort of aching.  Worse with certain movements that stretch the area.  No groin pain.  He went to an orthopedist recently [facility] and says his x-ray did not show any significant arthritis.  His hemoglobin A1c rose to 6.6% last visit and recommended starting metformin at that time. Additionally, his hemoglobin rose to 17.6 and I recommended he donate blood.   PMP AWARE reviewed today: most recent rx for testosterone  was filled 04/20/2023, # , rx by me. No red flags.  Past Medical History:  Diagnosis Date   BPH (benign prostatic hyperplasia)    Cataract    Right; impairing vision as of 05/2017. (has ophthalmologist)   Chronic renal insufficiency, stage II (mild)    CrCl  in the 60s   Coronary artery disease    Diabetes mellitus without complication, without long-term current use of insulin (HCC)    123. A1c was 5.9%. Stable at 6.0% 05/2016. A1c 6.0% 11/2017. 5.9% 08/2019.  Rose to 6.7% Jan 2025   GERD (gastroesophageal reflux disease)    Hearing loss    In a study at [facility] as of 2019   Hx of colonic polyps    03/2017 and 05/2021--adenomatous.  Recall 3 yrs. ([facility] spec)   Hypercholesteremia    Pt refuses statins (as of 05/2016). Declined again 02/2019.   Hypertension    IBS (irritable bowel syndrome)    OSA on CPAP    Verneita Day, NP at Neurology-Sleep Medicine at [facility]   Peripheral vascular disease [facility])    Permanent atrial fibrillation (HCC) 2001   failed DCCV per pt.  Rate control + coumadin --monitored by cardiologist.   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  Colchicine started by UC   Polycythemia    Pt states he had w/u for hemachromatosis but it was NEG.  As of 05/2016, his polycythemia is presumably due to testosterone  effect.   Prostate cancer screening    Pt declines any further prostate ca screening as of 05/2015.   Reactive airway disease    Scoliosis    Testosterone  deficiency     Past Surgical History:  Procedure Laterality Date   aortic ultrasound  09/2019   AAA screening; prox  aorta 3cm->ULN, no aneurism.   CARDIAC CATHETERIZATION  approx 2001   nonobstruc CAD   CIRCUMCISION     COLONOSCOPY W/ POLYPECTOMY  05/12/11; 04/27/17   2018 and 05/2021--adenomatous polyp; repeat 3 yrs   NASAL POLYP SURGERY     SKIN CANCER EXCISION     Non-malanoma   TRANSTHORACIC ECHOCARDIOGRAM  10/2019   EF 55-60%, nl LV syst fxn, severe LA and RA dilation, valves fine.   UPPER GI ENDOSCOPY  10/07/2021   normal except gastric polyps->Dr. Gregory.  Pt was then changed from protonix  to aciphex    VASECTOMY  1983   VPAP  03/2013   14/4, EEP=11    History reviewed. No pertinent family history.  Social History    Socioeconomic History   Marital status: Married    Spouse name: Not on file   Number of children: 2   Years of education: Not on file   Highest education level: Not on file  Occupational History   Occupation: retired  Tobacco Use   Smoking status: Former    Types: Cigars    Quit date: 08/28/1974    Years since quitting: 49.5   Smokeless tobacco: Never  Substance and Sexual Activity   Alcohol use: No   Drug use: No   Sexual activity: Yes  Other Topics Concern   Not on file  Social History Narrative   Married, 2 sons, 4 grandchildren.   Orig from [place].   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   No cigs, no alcohol, no drugs.   No exercise.   Social Drivers of Corporate investment banker Strain: Low Risk  (02/27/2024)   Overall Financial Resource Strain (CARDIA)    Difficulty of Paying Living Expenses: Not hard at all  Food Insecurity: No Food Insecurity (02/27/2024)   Hunger Vital Sign    Worried About Running Out of Food in the Last Year: Never true    Ran Out of Food in the Last Year: Never true  Transportation Needs: No Transportation Needs (02/27/2024)   PRAPARE - Administrator, Civil Service (Medical): No    Lack of Transportation (Non-Medical): No  Physical Activity: Inactive (02/27/2024)   Exercise Vital Sign    Days of Exercise per Week: 0 days    Minutes of Exercise per Session: 0 min  Stress: No Stress Concern Present (02/27/2024)   [employer] of Occupational Health - Occupational Stress Questionnaire    Feeling of Stress: Not at all  Social Connections: Socially Integrated (02/27/2024)   Social Connection and Isolation Panel    Frequency of Communication with Friends and Family: More than three times a week    Frequency of Social Gatherings with Friends and Family: Three times a week    Attends Religious Services: More than 4 times per year    Active Member of Clubs or Organizations: Yes     Attends Banker Meetings: More than 4 times per year    Marital Status: Married  Catering manager Violence: Not At Risk (02/27/2024)   Humiliation, Afraid, Rape, and Kick questionnaire    Fear of Current or Ex-Partner: No    Emotionally Abused: No    Physically Abused: No    Sexually Abused: No    Outpatient Medications Prior to Visit  Medication Sig Dispense Refill   albuterol  (VENTOLIN  HFA) 108 (90 Base) MCG/ACT inhaler Inhale 2 puffs into the lungs every 4 (four) hours as needed for wheezing or shortness  of breath. 6.7 g 0   amLODipine  (NORVASC ) 10 MG tablet Take 1 tablet (10 mg total) by mouth daily. 90 tablet 3   Calcium Carbonate-Vitamin D 600-200 MG-UNIT TABS Take 1 tablet by mouth 2 (two) times daily.     fluticasone  (FLONASE ) 50 MCG/ACT nasal spray Place 2 sprays into both nostrils daily. 16 g 6   Glucosamine-Chondroit-Vit C-Mn (GLUCOSAMINE-CHONDROITIN MAX ST PO) Take 2 capsules by mouth daily.      hydrochlorothiazide  (MICROZIDE ) 12.5 MG capsule Take 1 capsule (12.5 mg total) by mouth daily. 90 capsule 3   ipratropium (ATROVENT ) 0.03 % nasal spray Place 2 sprays into both nostrils every 12 (twelve) hours for 3 days as needed for runny nose (Patient taking differently: Place 2 sprays into both nostrils every 12 (twelve) hours. As needed) 30 mL 1   Multiple Vitamins-Minerals (MENS MULTIVITAMIN PLUS PO) Take 1 tablet by mouth daily.     omeprazole  (PRILOSEC) 40 MG capsule Take 1 capsule (40 mg total) by mouth daily. 90 capsule 3   warfarin (COUMADIN ) 2.5 MG tablet Take by mouth.     warfarin (COUMADIN ) 2.5 MG tablet Take up to 1.5 tablets (3.75 mg total) by mouth daily or as directed. 135 tablet 3   Testosterone  12.5 MG/ACT (1%) GEL apply 3 pumps onto THE SKIN three DAYS PER WEEK 150 g 5   diltiazem  (CARDIZEM ) 30 MG tablet 1 tab po bid AS NEEDED for rapid heart rate (Patient not taking: Reported on 03/18/2024) 30 tablet 0   bisoprolol -hydrochlorothiazide  (ZIAC ) 5-6.25 MG  tablet Take 1 tablet by mouth daily. 180 tablet 1   valsartan  (DIOVAN ) 320 MG tablet Take 1 tablet (320 mg total) by mouth daily. 90 tablet 0   No facility-administered medications prior to visit.    Allergies  Allergen Reactions   Milk (Cow) Other (See Comments)    Causes reflux if consumed in the pm.   Atorvastatin     REACTION: INTOL to Lipitor   Pollen Extract Other (See Comments)    Stuffy nose    Review of Systems  Constitutional:  Negative for appetite change, chills, fatigue and fever.  HENT:  Negative for congestion, dental problem, ear pain and sore throat.   Eyes:  Negative for discharge, redness and visual disturbance.  Respiratory:  Negative for cough, chest tightness, shortness of breath and wheezing.   Cardiovascular:  Negative for chest pain, palpitations and leg swelling.  Gastrointestinal:  Negative for abdominal pain, blood in stool, diarrhea, nausea and vomiting.  Genitourinary:  Negative for difficulty urinating, dysuria, flank pain, frequency, hematuria and urgency.  Musculoskeletal:  Positive for arthralgias (left hip). Negative for back pain, joint swelling, myalgias and neck stiffness.  Skin:  Negative for pallor and rash.  Neurological:  Negative for dizziness, speech difficulty, weakness and headaches.  Hematological:  Negative for adenopathy. Does not bruise/bleed easily.  Psychiatric/Behavioral:  Negative for confusion and sleep disturbance. The patient is not nervous/anxious.     PE;    03/18/2024    8:34 AM 02/27/2024    9:01 AM 09/18/2023    8:10 AM  Vitals with BMI  Height 5' 5.75 5' 5   Weight 211 lbs 6 oz 215 lbs 221 lbs 13 oz  BMI 34.38 35.78   Systolic 124  121  Diastolic 69  82  Pulse 75  70    Gen: Alert, well appearing.  Patient is oriented to person, place, time, and situation. AFFECT: pleasant, lucid thought and speech. ENT: Ears: EACs clear,  normal epithelium.  TMs with good light reflex and landmarks bilaterally.  Eyes: no  injection, icteris, swelling, or exudate.  EOMI, PERRLA. Nose: no drainage or turbinate edema/swelling.  No injection or focal lesion.  Mouth: lips without lesion/swelling.  Oral mucosa pink and moist.  Dentition intact and without obvious caries or gingival swelling.  Oropharynx without erythema, exudate, or swelling.  Neck: supple/nontender.  No LAD, mass, or TM.  Carotid pulses 2+ bilaterally, without bruits. CV: Irreg Irreg, distant S1 and S2,  no murmur   LUNGS: CTA bilat, nonlabored resps, good aeration in all lung fields. ABD: soft, NT, ND, BS normal.  No hepatospenomegaly or mass.  No bruits. EXT: no clubbing, cyanosis, or edema.  Musculoskeletal: no joint swelling, erythema, warmth, or tenderness.  ROM of all joints intact. Mild TTP over left greater troch.  Mild inc in the pain with tensing of the TFL/prox ITB.  Hip ROM intact. Skin - no sores or suspicious lesions or rashes or color changes  Pertinent labs:  Lab Results  Component Value Date   TSH 1.53 03/12/2023   Lab Results  Component Value Date   WBC 6.0 09/18/2023   HGB 17.6 (H) 09/18/2023   HCT 52.2 (H) 09/18/2023   MCV 94.1 09/18/2023   PLT 176.0 09/18/2023   Lab Results  Component Value Date   IRON 132 09/15/2011   Lab Results  Component Value Date   CREATININE 1.14 09/18/2023   BUN 19 09/18/2023   NA 140 09/18/2023   K 4.3 09/18/2023   CL 99 09/18/2023   CO2 32 09/18/2023   Lab Results  Component Value Date   ALT 16 03/12/2023   AST 17 03/12/2023   ALKPHOS 50 03/12/2023   BILITOT 0.9 03/12/2023   Lab Results  Component Value Date   CHOL 174 09/15/2022   Lab Results  Component Value Date   HDL 35.10 (L) 09/15/2022   Lab Results  Component Value Date   LDLCALC 112 (H) 09/15/2022   Lab Results  Component Value Date   TRIG 137.0 09/15/2022   Lab Results  Component Value Date   CHOLHDL 5 09/15/2022   Lab Results  Component Value Date   PSA 2.19 12/21/2017   PSA 2.15 06/21/2016   PSA  2.53 12/01/2013   Lab Results  Component Value Date   HGBA1C 6.6 (H) 09/18/2023   Lab Results  Component Value Date   TESTOSTERONE  142.31 (L) 09/18/2023   ASSESSMENT AND PLAN:   #1 health maintenance exam: Reviewed age and gender appropriate health maintenance issues (prudent diet, regular exercise, health risks of tobacco and excessive alcohol, use of seatbelts, fire alarms in home, use of sunscreen).  Also reviewed age and gender appropriate health screening as well as vaccine recommendations. Vaccines:  UTD. Labs: CBC, c-Met, lipid panel, hemoglobin A1c Prostate ca screening: pt declines further screening. Colon ca screening: hx polyps, recall 05/2024 (he will contact digestive health specialist to schedule).  2 hypertension, well-controlled on amlodipine  10 mg a day, bisoprolol /HCTZ 5/6.251 tab daily, HCTZ 12.5 mg daily, and valsartan  320 mg a day. Electrolytes and creatinine today.   #3 chronic atrial fibrillation. Asymptomatic.  Continue bisoprolol -HCTZ 5-6.25, 1 tab daily. Cont coumadin  for stroke prophylaxis. He has not had to use any of his as needed low-dose/short acting diltiazem .   #3 hypogonadism. He has felt a little bit of benefit from being on testosterone  gel.  Continue 3 pumps bilateral 3 days a week. Testosterone  level and hemoglobin level today.   #  4 hypercholesterolemia. He does not want trial of any cholesterol-lowering medication. Lipid panel today.   #5  Borderline diabetes.  Maximum A1c was 6.6% about 7 mo ago. Not on any meds at this time. Hemoglobin A1c today.  #6 left hip trochanteric pain syndrome. Home exercises printed for patient today.  An After Visit Summary was printed and given to the patient.  FOLLOW UP:  Return in about 6 months (around 09/18/2024) for routine chronic illness f/u.  Signed:  Gerlene Hockey, MD           03/18/2024

## 2024-03-19 ENCOUNTER — Other Ambulatory Visit: Payer: Self-pay

## 2024-03-19 ENCOUNTER — Ambulatory Visit: Payer: Self-pay | Admitting: Family Medicine

## 2024-03-19 NOTE — Telephone Encounter (Signed)
 Spoke with patient and confirmed he would like Testosterone  rx resent to [facility]

## 2024-03-23 DIAGNOSIS — I499 Cardiac arrhythmia, unspecified: Secondary | ICD-10-CM | POA: Diagnosis not present

## 2024-03-23 DIAGNOSIS — I4891 Unspecified atrial fibrillation: Secondary | ICD-10-CM | POA: Diagnosis not present

## 2024-03-24 MED ORDER — TESTOSTERONE 12.5 MG/ACT (1%) TD GEL: TRANSDERMAL | 5 refills | Status: AC

## 2024-03-24 NOTE — Telephone Encounter (Signed)
 Okay prescription for testosterone  gel [facility]

## 2024-03-25 DIAGNOSIS — I482 Chronic atrial fibrillation, unspecified: Secondary | ICD-10-CM | POA: Diagnosis not present

## 2024-03-25 DIAGNOSIS — I517 Cardiomegaly: Secondary | ICD-10-CM | POA: Diagnosis not present

## 2024-03-25 DIAGNOSIS — I361 Nonrheumatic tricuspid (valve) insufficiency: Secondary | ICD-10-CM | POA: Diagnosis not present

## 2024-03-25 DIAGNOSIS — I350 Nonrheumatic aortic (valve) stenosis: Secondary | ICD-10-CM | POA: Diagnosis not present

## 2024-03-25 DIAGNOSIS — I340 Nonrheumatic mitral (valve) insufficiency: Secondary | ICD-10-CM | POA: Diagnosis not present

## 2024-04-14 DIAGNOSIS — I480 Paroxysmal atrial fibrillation: Secondary | ICD-10-CM | POA: Diagnosis not present

## 2024-04-14 DIAGNOSIS — Z7901 Long term (current) use of anticoagulants: Secondary | ICD-10-CM | POA: Diagnosis not present

## 2024-04-15 ENCOUNTER — Other Ambulatory Visit (HOSPITAL_COMMUNITY): Payer: Self-pay

## 2024-05-12 DIAGNOSIS — I480 Paroxysmal atrial fibrillation: Secondary | ICD-10-CM | POA: Diagnosis not present

## 2024-05-12 DIAGNOSIS — Z7901 Long term (current) use of anticoagulants: Secondary | ICD-10-CM | POA: Diagnosis not present

## 2024-05-27 ENCOUNTER — Encounter: Payer: Self-pay | Admitting: Family Medicine

## 2024-05-27 ENCOUNTER — Ambulatory Visit: Admitting: Family Medicine

## 2024-05-27 VITALS — BP 121/77 | HR 74 | Temp 97.3°F | Ht 65.75 in | Wt 211.4 lb

## 2024-05-27 DIAGNOSIS — H539 Unspecified visual disturbance: Secondary | ICD-10-CM

## 2024-05-27 DIAGNOSIS — G459 Transient cerebral ischemic attack, unspecified: Secondary | ICD-10-CM | POA: Diagnosis not present

## 2024-05-27 DIAGNOSIS — Z23 Encounter for immunization: Secondary | ICD-10-CM

## 2024-05-27 MED ORDER — PRAVASTATIN SODIUM 40 MG PO TABS: 40.0000 mg | ORAL_TABLET | Freq: Every day | ORAL | Status: AC

## 2024-05-27 NOTE — Progress Notes (Signed)
 OFFICE VISIT  05/27/2024  CC:  Chief Complaint  Patient presents with   Vision Concern    Pt has been seeing weird colors in left eye a few times; he had eye doctor appt yesterday, had concern about possible stroke;  denies loss of vision or pain   Patient is a 78 y.o. male who presents accompanied by his wife Rojelio for vision concern.  HPI: 2 days ago when he was attending church he noted acute onset of change in vision in left eye.  Describes some foggy/gray spots as well as bright flashing lights.  Denies complete loss of vision.  No headache or dizziness.  It lasted approximately 5 minutes and then spontaneously resolved.  He denied any facial droop, eye droop, dysarthria, tremor, focal arm or leg weakness, or sensory symptoms. He recalls having something similar to this in the remote past x 1 episode.  He takes Coumadin  for chronic A-fib and his INR is are consistently within range with home INR monitoring (most recently 2.4 yesterday).  ROS as above, plus--> no fevers, no CP, no SOB, no wheezing, no cough, no rashes, no melena/hematochezia.  No polyuria or polydipsia.  No myalgias or arthralgias.  No acute vision or hearing abnormalities.  No dysuria or unusual/new urinary urgency or frequency.  No recent changes in lower legs. No n/v/d or abd pain.     Past Medical History:  Diagnosis Date   BPH (benign prostatic hyperplasia)    Cataract    Right; impairing vision as of 05/2017. (has ophthalmologist)   Chronic renal insufficiency, stage II (mild)    CrCl in the 60s   Coronary artery disease    Diabetes mellitus without complication, without long-term current use of insulin (HCC)    123. A1c was 5.9%. Stable at 6.0% 05/2016. A1c 6.0% 11/2017. 5.9% 08/2019.  Rose to 6.7% Jan 2025   GERD (gastroesophageal reflux disease)    Hearing loss    In a study at [facility] as of 2019   Hx of colonic polyps    03/2017 and 05/2021--adenomatous.  Recall 3 yrs. ([facility] spec)   Hypercholesteremia     Pt refuses statins (as of 05/2016). Declined again 02/2019.   Hypertension    IBS (irritable bowel syndrome)    OSA on CPAP    Verneita Day, NP at Neurology-Sleep Medicine at [facility]   Peripheral vascular disease    Permanent atrial fibrillation (HCC) 2001   failed DCCV per pt.  Rate control + coumadin --monitored by cardiologist.   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  Colchicine started by UC   Polycythemia    Pt states he had w/u for hemachromatosis but it was NEG.  As of 05/2016, his polycythemia is presumably due to testosterone  effect.   Prostate cancer screening    Pt declines any further prostate ca screening as of 05/2015.   Reactive airway disease    Scoliosis    Testosterone  deficiency     Past Surgical History:  Procedure Laterality Date   aortic ultrasound  09/2019   AAA screening; prox aorta 3cm->ULN, no aneurism.   CARDIAC CATHETERIZATION  approx 2001   nonobstruc CAD   CIRCUMCISION     COLONOSCOPY W/ POLYPECTOMY  05/12/11; 04/27/17   2018 and 05/2021--adenomatous polyp; repeat 3 yrs   NASAL POLYP SURGERY     SKIN CANCER EXCISION     Non-malanoma   TRANSTHORACIC ECHOCARDIOGRAM  10/2019   2021  EF 55-60%, nl LV syst fxn, severe LA and RA dilation, valves fine.  03/25/24 EF 55-60%, severe LA dil, RV mod dil, nl RV fxn, mod tricusp regurg, mild AS and mild MR.   UPPER GI ENDOSCOPY  10/07/2021   normal except gastric polyps->Dr. Gregory.  Pt was then changed from protonix  to aciphex    VASECTOMY  1983   VPAP  03/2013   14/4, EEP=11    Outpatient Medications Prior to Visit  Medication Sig Dispense Refill   albuterol  (VENTOLIN  HFA) 108 (90 Base) MCG/ACT inhaler Inhale 2 puffs into the lungs every 4 (four) hours as needed for wheezing or shortness of breath. 6.7 g 0   amLODipine  (NORVASC ) 10 MG tablet Take 1 tablet (10 mg total) by mouth daily. 90 tablet 3   bisoprolol -hydrochlorothiazide  (ZIAC ) 5-6.25 MG tablet Take 1 tablet by  mouth daily. 180 tablet 3   Calcium Carbonate-Vitamin D 600-200 MG-UNIT TABS Take 1 tablet by mouth 2 (two) times daily.     fluticasone  (FLONASE ) 50 MCG/ACT nasal spray Place 2 sprays into both nostrils daily. 16 g 6   hydrochlorothiazide  (MICROZIDE ) 12.5 MG capsule Take 1 capsule (12.5 mg total) by mouth daily. 90 capsule 3   ipratropium (ATROVENT ) 0.03 % nasal spray Place 2 sprays into both nostrils every 12 (twelve) hours for 3 days as needed for runny nose (Patient taking differently: Place 2 sprays into both nostrils every 12 (twelve) hours. As needed) 30 mL 1   Multiple Vitamins-Minerals (MENS MULTIVITAMIN PLUS PO) Take 1 tablet by mouth daily.     omeprazole  (PRILOSEC) 40 MG capsule Take 1 capsule (40 mg total) by mouth daily. 90 capsule 3   Testosterone  12.5 MG/ACT (1%) GEL apply 3 pumps onto THE SKIN three DAYS PER WEEK 150 g 5   valsartan  (DIOVAN ) 320 MG tablet Take 1 tablet (320 mg total) by mouth daily. 90 tablet 3   warfarin (COUMADIN ) 2.5 MG tablet Take by mouth.     warfarin (COUMADIN ) 2.5 MG tablet Take up to 1.5 tablets (3.75 mg total) by mouth daily or as directed. 135 tablet 3   diltiazem  (CARDIZEM ) 30 MG tablet 1 tab po bid AS NEEDED for rapid heart rate (Patient not taking: Reported on 03/18/2024) 30 tablet 0   Glucosamine-Chondroit-Vit C-Mn (GLUCOSAMINE-CHONDROITIN MAX ST PO) Take 2 capsules by mouth daily.      No facility-administered medications prior to visit.    Allergies  Allergen Reactions   Milk (Cow) Other (See Comments)    Causes reflux if consumed in the pm.   Atorvastatin     REACTION: INTOL to Lipitor   Pollen Extract Other (See Comments)    Stuffy nose    Review of Systems  As per HPI  PE:    05/27/2024    8:56 AM 03/18/2024    8:34 AM 02/27/2024    9:01 AM  Vitals with BMI  Height 5' 5.75 5' 5.75 5' 5  Weight 211 lbs 6 oz 211 lbs 6 oz 215 lbs  BMI 34.38 34.38 35.78  Systolic 121 124   Diastolic 77 69   Pulse 74 75      Physical  Exam  Gen: Alert, well appearing.  Patient is oriented to person, place, time, and situation. AFFECT: pleasant, lucid thought and speech. EYES: PERRLA, EOMI, no nystagmus. Neuro: CN 2-12 intact bilaterally, strength 5/5 in proximal and distal upper extremities and lower extremities bilaterally.  No sensory deficits.  No tremor.  No disdiadochokinesis.  No ataxia.  Upper extremity and lower extremity DTRs symmetric.  No pronator drift.  LABS:  Last CBC Lab Results  Component Value Date   WBC 6.0 03/18/2024   HGB 17.3 (H) 03/18/2024   HCT 50.3 03/18/2024   MCV 90.5 03/18/2024   MCH 31.6 12/07/2015   RDW 14.1 03/18/2024   PLT 183.0 03/18/2024   Last metabolic panel Lab Results  Component Value Date   GLUCOSE 121 (H) 03/18/2024   NA 139 03/18/2024   K 4.3 03/18/2024   CL 99 03/18/2024   CO2 32 03/18/2024   BUN 17 03/18/2024   CREATININE 1.11 03/18/2024   GFR 63.54 03/18/2024   CALCIUM 9.7 03/18/2024   PROT 7.3 03/18/2024   ALBUMIN 4.6 03/18/2024   BILITOT 1.3 (H) 03/18/2024   ALKPHOS 49 03/18/2024   AST 21 03/18/2024   ALT 20 03/18/2024   Last lipids Lab Results  Component Value Date   CHOL 194 03/18/2024   HDL 34.70 (L) 03/18/2024   LDLCALC 120 (H) 03/18/2024   LDLDIRECT 129.0 03/24/2022   TRIG 198.0 (H) 03/18/2024   CHOLHDL 6 03/18/2024   Last hemoglobin A1c Lab Results  Component Value Date   HGBA1C 6.5 03/18/2024   Last thyroid  functions Lab Results  Component Value Date   TSH 1.53 03/12/2023   IMPRESSION AND PLAN:  #1 transient visual disturbance. TIA suspected. More likely embolic given the presence of chronic atrial fibrillation, although it is good that his INR has been therapeutic. Will get urgent CT angio of head and neck to further evaluate for obstructive disease. Discussed possibility of getting on aspirin in the future but will hold off for now. Continue Coumadin  at current dosing. He will arrange urgent ophthalmology exam as well. We may  have to get him back to his cardiologist for consideration of electrical or chemical cardioversion in interest of decreasing risk of thrombus formation (vs LA appendage occlusive device).  He will start statin--> he did not tolerate Lipitor in the past. His wife has a full bottle of pravastatin 40 mg that she used to take.  He will start this and we will consider switching him over to rosuvastatin in the future depending on how he tolerates the pravastatin and how well LDL responds.  I personally spent a total of 43 minutes in the care of the patient today including preparing to see the patient, performing a medically appropriate exam/evaluation, counseling and educating, placing orders, referring and communicating with other health care professionals, documenting clinical information in the EHR, and coordinating care.  An After Visit Summary was printed and given to the patient.  FOLLOW UP: Return in about 4 weeks (around 06/24/2024) for f/u transient visual disturnbance L eye, and hyperchol/statin.  Signed:  Gerlene Hockey, MD           05/27/2024

## 2024-05-28 ENCOUNTER — Ambulatory Visit: Payer: Self-pay | Admitting: Family Medicine

## 2024-05-28 ENCOUNTER — Ambulatory Visit (HOSPITAL_BASED_OUTPATIENT_CLINIC_OR_DEPARTMENT_OTHER)
Admission: RE | Admit: 2024-05-28 | Discharge: 2024-05-28 | Disposition: A | Discharge status: Home / Self Care | Source: Ambulatory Visit | Attending: Family Medicine | Admitting: Family Medicine

## 2024-05-28 DIAGNOSIS — G459 Transient cerebral ischemic attack, unspecified: Secondary | ICD-10-CM | POA: Insufficient documentation

## 2024-05-28 DIAGNOSIS — H539 Unspecified visual disturbance: Secondary | ICD-10-CM | POA: Diagnosis not present

## 2024-05-28 DIAGNOSIS — Z6834 Body mass index (BMI) 34.0-34.9, adult: Secondary | ICD-10-CM | POA: Diagnosis not present

## 2024-05-28 DIAGNOSIS — I482 Chronic atrial fibrillation, unspecified: Secondary | ICD-10-CM | POA: Diagnosis not present

## 2024-05-28 DIAGNOSIS — I6523 Occlusion and stenosis of bilateral carotid arteries: Secondary | ICD-10-CM | POA: Diagnosis not present

## 2024-05-28 DIAGNOSIS — E6609 Other obesity due to excess calories: Secondary | ICD-10-CM | POA: Diagnosis not present

## 2024-05-28 DIAGNOSIS — I672 Cerebral atherosclerosis: Secondary | ICD-10-CM | POA: Diagnosis not present

## 2024-05-28 DIAGNOSIS — I4891 Unspecified atrial fibrillation: Secondary | ICD-10-CM | POA: Diagnosis not present

## 2024-05-28 DIAGNOSIS — G4739 Other sleep apnea: Secondary | ICD-10-CM | POA: Diagnosis not present

## 2024-05-28 DIAGNOSIS — E66811 Obesity, class 1: Secondary | ICD-10-CM | POA: Diagnosis not present

## 2024-05-28 DIAGNOSIS — I6782 Cerebral ischemia: Secondary | ICD-10-CM | POA: Diagnosis not present

## 2024-05-28 DIAGNOSIS — R9089 Other abnormal findings on diagnostic imaging of central nervous system: Secondary | ICD-10-CM | POA: Diagnosis not present

## 2024-05-28 LAB — POCT I-STAT CREATININE: Creatinine, Ser: 1.2 mg/dL (ref 0.61–1.24)

## 2024-05-28 MED ORDER — IOHEXOL 350 MG/ML SOLN
60.0000 mL | Freq: Once | INTRAVENOUS | Status: AC | PRN
  Administered 2024-05-28: 60 mL via INTRAVENOUS

## 2024-05-30 ENCOUNTER — Encounter: Payer: Self-pay | Admitting: Family Medicine

## 2024-05-30 ENCOUNTER — Other Ambulatory Visit: Payer: Self-pay | Admitting: Family Medicine

## 2024-05-30 ENCOUNTER — Other Ambulatory Visit

## 2024-05-30 DIAGNOSIS — G459 Transient cerebral ischemic attack, unspecified: Secondary | ICD-10-CM

## 2024-05-30 DIAGNOSIS — H539 Unspecified visual disturbance: Secondary | ICD-10-CM

## 2024-05-30 NOTE — Telephone Encounter (Signed)
 Noted. I sent pt mychart message

## 2024-05-30 NOTE — Telephone Encounter (Signed)
 Can you call this office for me?

## 2024-05-30 NOTE — Telephone Encounter (Signed)
 Called office and they do not have any sooner appts. Office was advised of aspirin being added due to recent TIA. Pt will be added to cancellation list

## 2024-05-30 NOTE — Telephone Encounter (Signed)
 I sent Jeffrey Little a MyChart message today instructing him to start aspirin 81 mg a day. Please call his cardiologist office (Dr. Zachary Bodziock with [facility].  Details are in care teams section of EMR) to see if you can get him in for a visit within the next 2 weeks (the reason is follow-up A-fib.  We recently added aspirin to his Coumadin  because he had a TIA).

## 2024-06-04 DIAGNOSIS — H47292 Other optic atrophy, left eye: Secondary | ICD-10-CM | POA: Diagnosis not present

## 2024-06-04 DIAGNOSIS — H539 Unspecified visual disturbance: Secondary | ICD-10-CM | POA: Diagnosis not present

## 2024-06-05 ENCOUNTER — Other Ambulatory Visit: Payer: Self-pay | Admitting: Family Medicine

## 2024-06-05 ENCOUNTER — Other Ambulatory Visit: Payer: Self-pay

## 2024-06-05 ENCOUNTER — Other Ambulatory Visit (HOSPITAL_COMMUNITY): Payer: Self-pay

## 2024-06-05 MED ORDER — ALBUTEROL SULFATE HFA 108 (90 BASE) MCG/ACT IN AERS
2.0000 | INHALATION_SPRAY | RESPIRATORY_TRACT | 2 refills | Status: AC | PRN
  Filled 2024-06-05: qty 6.7, 16d supply, fill #0
  Filled 2024-09-30: qty 6.7, 16d supply, fill #1

## 2024-06-05 MED ORDER — FLUTICASONE PROPIONATE 50 MCG/ACT NA SUSP
2.0000 | Freq: Every day | NASAL | 2 refills | Status: AC
  Filled 2024-06-05: qty 16, 30d supply, fill #0

## 2024-06-05 MED ORDER — IPRATROPIUM BROMIDE 0.03 % NA SOLN
2.0000 | Freq: Two times a day (BID) | NASAL | 1 refills | Status: AC
  Filled 2024-06-05: qty 30, 43d supply, fill #0

## 2024-06-09 DIAGNOSIS — I480 Paroxysmal atrial fibrillation: Secondary | ICD-10-CM | POA: Diagnosis not present

## 2024-06-09 DIAGNOSIS — Z7901 Long term (current) use of anticoagulants: Secondary | ICD-10-CM | POA: Diagnosis not present

## 2024-06-15 ENCOUNTER — Other Ambulatory Visit (HOSPITAL_COMMUNITY): Payer: Self-pay

## 2024-06-16 ENCOUNTER — Other Ambulatory Visit (HOSPITAL_COMMUNITY): Payer: Self-pay

## 2024-06-16 DIAGNOSIS — H539 Unspecified visual disturbance: Secondary | ICD-10-CM | POA: Diagnosis not present

## 2024-06-16 DIAGNOSIS — G459 Transient cerebral ischemic attack, unspecified: Secondary | ICD-10-CM | POA: Diagnosis not present

## 2024-06-16 DIAGNOSIS — I482 Chronic atrial fibrillation, unspecified: Secondary | ICD-10-CM | POA: Diagnosis not present

## 2024-06-18 ENCOUNTER — Encounter: Payer: Self-pay | Admitting: Neurology

## 2024-06-18 DIAGNOSIS — I499 Cardiac arrhythmia, unspecified: Secondary | ICD-10-CM | POA: Diagnosis not present

## 2024-06-18 DIAGNOSIS — I4891 Unspecified atrial fibrillation: Secondary | ICD-10-CM | POA: Diagnosis not present

## 2024-06-24 ENCOUNTER — Ambulatory Visit: Admitting: Family Medicine

## 2024-06-24 ENCOUNTER — Encounter: Payer: Self-pay | Admitting: Family Medicine

## 2024-06-24 VITALS — BP 112/73 | HR 66 | Temp 98.0°F | Ht 65.75 in | Wt 215.0 lb

## 2024-06-24 DIAGNOSIS — E7800 Pure hypercholesterolemia, unspecified: Secondary | ICD-10-CM | POA: Diagnosis not present

## 2024-06-24 DIAGNOSIS — I10 Essential (primary) hypertension: Secondary | ICD-10-CM | POA: Diagnosis not present

## 2024-06-24 DIAGNOSIS — G459 Transient cerebral ischemic attack, unspecified: Secondary | ICD-10-CM

## 2024-06-24 DIAGNOSIS — E119 Type 2 diabetes mellitus without complications: Secondary | ICD-10-CM

## 2024-06-24 LAB — POCT GLYCOSYLATED HEMOGLOBIN (HGB A1C)
HbA1c POC (<> result, manual entry): 6 % (ref 4.0–5.6)
HbA1c, POC (controlled diabetic range): 6 % (ref 0.0–7.0)
HbA1c, POC (prediabetic range): 6 % (ref 5.7–6.4)
Hemoglobin A1C: 6 % — AB (ref 4.0–5.6)

## 2024-06-24 LAB — COMPREHENSIVE METABOLIC PANEL WITH GFR
ALT: 19 U/L (ref 0–53)
AST: 18 U/L (ref 0–37)
Albumin: 4.4 g/dL (ref 3.5–5.2)
Alkaline Phosphatase: 48 U/L (ref 39–117)
BUN: 16 mg/dL (ref 6–23)
CO2: 33 meq/L — ABNORMAL HIGH (ref 19–32)
Calcium: 9.3 mg/dL (ref 8.4–10.5)
Chloride: 99 meq/L (ref 96–112)
Creatinine, Ser: 1.02 mg/dL (ref 0.40–1.50)
GFR: 70.2 mL/min (ref >60.00)
Glucose, Bld: 117 mg/dL — ABNORMAL HIGH (ref 70–99)
Potassium: 4.3 meq/L (ref 3.5–5.1)
Sodium: 139 meq/L (ref 135–145)
Total Bilirubin: 1 mg/dL (ref 0.2–1.2)
Total Protein: 7.1 g/dL (ref 6.0–8.3)

## 2024-06-24 LAB — LIPID PANEL
Cholesterol: 137 mg/dL (ref 0–200)
HDL: 39.5 mg/dL (ref >39.00)
LDL Cholesterol: 62 mg/dL (ref 0–99)
NonHDL: 97.61
Total CHOL/HDL Ratio: 3
Triglycerides: 180 mg/dL — ABNORMAL HIGH (ref 0.0–149.0)
VLDL: 36 mg/dL (ref 0.0–40.0)

## 2024-06-24 NOTE — Progress Notes (Signed)
 OFFICE VISIT  06/24/2024  CC:  Chief Complaint  Patient presents with   Medical Management of Chronic Issues    Pt is fasting    Patient is a 78 y.o. male who presents accompanied by his wife for 1 month follow-up TIA. A/P as of last visit: #1 transient visual disturbance. TIA suspected. More likely embolic given the presence of chronic atrial fibrillation, although it is good that his INR has been therapeutic. Will get urgent CT angio of head and neck to further evaluate for obstructive disease. Discussed possibility of getting on aspirin in the future but will hold off for now. Continue Coumadin  at current dosing. He will arrange urgent ophthalmology exam as well. We may have to get him back to his cardiologist for consideration of electrical or chemical cardioversion in interest of decreasing risk of thrombus formation (vs LA appendage occlusive device).   He will start statin--> he did not tolerate Lipitor in the past. His wife has a full bottle of pravastatin 40 mg that she used to take.  He will start this and we will consider switching him over to rosuvastatin in the future depending on how he tolerates the pravastatin and how well LDL responds.  INTERIM HX: He has not had any recurrence of his visual disturbance. He recently followed up with his cardiologist and no changes were made.  His CT angiogram showed no large vessel occlusion, hemodynamically significant stenosis, or aneurysm in the head or neck.  Some aortic arch, carotid siphons, and proximal right common carotid artery disease.  Some disease at the left carotid bifurcation as well.  No hemodynamically significant stenosis.  He had mild stenosis of the bilateral cavernous and right paraclinoid ICA segments.  Mild chronic microvascular ischemic changes were noted.    Past Medical History:  Diagnosis Date   BPH (benign prostatic hyperplasia)    Cataract    Right; impairing vision as of 05/2017. (has  ophthalmologist)   Chronic renal insufficiency, stage II (mild)    CrCl in the 60s   Coronary artery disease    Diabetes mellitus without complication, without long-term current use of insulin (HCC)    123. A1c was 5.9%. Stable at 6.0% 05/2016. A1c 6.0% 11/2017. 5.9% 08/2019.  Rose to 6.7% Jan 2025   GERD (gastroesophageal reflux disease)    Hearing loss    In a study at [facility] as of 2019   Hx of colonic polyps    03/2017 and 05/2021--adenomatous.  Recall 3 yrs. ([facility] spec)   Hypercholesteremia    Pt refuses statins (as of 05/2016). Declined again 02/2019.   Hypertension    IBS (irritable bowel syndrome)    OSA on CPAP    Verneita Day, NP at Neurology[facility]Sleep Medicine at [facility]   Peripheral vascular disease    Permanent atrial fibrillation (HCC) 2001   failed DCCV per pt.  Rate control + coumadin --monitored by cardiologist.   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  Colchicine started by UC   Polycythemia    Pt states he had w/u for hemachromatosis but it was NEG.  As of 05/2016, his polycythemia is presumably due to testosterone  effect.   Prostate cancer screening    Pt declines any further prostate ca screening as of 05/2015.   Reactive airway disease    Scoliosis    Testosterone  deficiency    TIA (transient ischemic attack)    Left eye visual disturbance September 2025--> CT  angio okay.  Aspirin started.    Past Surgical History:  Procedure Laterality Date   aortic ultrasound  09/2019   AAA screening; prox aorta 3cm->ULN, no aneurism.   CARDIAC CATHETERIZATION  approx 2001   nonobstruc CAD   CIRCUMCISION     COLONOSCOPY W/ POLYPECTOMY  05/12/11; 04/27/17   2018 and 05/2021--adenomatous polyp; repeat 3 yrs   NASAL POLYP SURGERY     SKIN CANCER EXCISION     Non-malanoma   TRANSTHORACIC ECHOCARDIOGRAM  10/2019   2021 EF 55-60%, nl LV syst fxn, severe LA and RA dilation, valves fine.  03/25/24 EF 55-60%, severe LA dil, RV mod dil, nl RV  fxn, mod tricusp regurg, mild AS and mild MR.   UPPER GI ENDOSCOPY  10/07/2021   normal except gastric polyps->Dr. Gregory.  Pt was then changed from protonix  to aciphex    VASECTOMY  1983   VPAP  03/2013   14/4, EEP=11    Outpatient Medications Prior to Visit  Medication Sig Dispense Refill   albuterol  (VENTOLIN  HFA) 108 (90 Base) MCG/ACT inhaler Inhale 2 puffs into the lungs every 4 (four) hours as needed for wheezing or shortness of breath. 6.7 g 2   amLODipine  (NORVASC ) 10 MG tablet Take 1 tablet (10 mg total) by mouth daily. 90 tablet 3   bisoprolol -hydrochlorothiazide  (ZIAC ) 5-6.25 MG tablet Take 1 tablet by mouth daily. 180 tablet 3   Calcium Carbonate-Vitamin D 600-200 MG-UNIT TABS Take 1 tablet by mouth 2 (two) times daily.     fluticasone  (FLONASE ) 50 MCG/ACT nasal spray Place 2 sprays into both nostrils daily. 16 g 2   hydrochlorothiazide  (MICROZIDE ) 12.5 MG capsule Take 1 capsule (12.5 mg total) by mouth daily. 90 capsule 3   ipratropium (ATROVENT ) 0.03 % nasal spray Place 2 sprays into both nostrils every 12 (twelve) hours for 3 days as needed for runny nose 30 mL 1   Multiple Vitamins-Minerals (MENS MULTIVITAMIN PLUS PO) Take 1 tablet by mouth daily.     omeprazole  (PRILOSEC) 40 MG capsule Take 1 capsule (40 mg total) by mouth daily. 90 capsule 3   pravastatin (PRAVACHOL) 40 MG tablet Take 1 tablet (40 mg total) by mouth daily.     Testosterone  12.5 MG/ACT (1%) GEL apply 3 pumps onto THE SKIN three DAYS PER WEEK 150 g 5   valsartan  (DIOVAN ) 320 MG tablet Take 1 tablet (320 mg total) by mouth daily. 90 tablet 3   warfarin (COUMADIN ) 2.5 MG tablet Take up to 1.5 tablets (3.75 mg total) by mouth daily or as directed. 135 tablet 3   warfarin (COUMADIN ) 2.5 MG tablet Take by mouth.     No facility-administered medications prior to visit.    Allergies  Allergen Reactions   Milk (Cow) Other (See Comments)    Causes reflux if consumed in the pm.   Atorvastatin     REACTION: INTOL  to Lipitor   Pollen Extract Other (See Comments)    Stuffy nose    Review of Systems As per HPI  PE:    06/24/2024   10:23 AM 05/27/2024    8:56 AM 03/18/2024    8:34 AM  Vitals with BMI  Height 5' 5.75 5' 5.75 5' 5.75  Weight 215 lbs 211 lbs 6 oz 211 lbs 6 oz  BMI 34.97 34.38 34.38  Systolic 112 121 875  Diastolic 73 77 69  Pulse 66 74 75     Physical Exam  General Alert and well-appearing. No further exam today.  LABS:  Last CBC Lab Results  Component Value Date   WBC 6.0 03/18/2024   HGB 17.3 (H) 03/18/2024   HCT 50.3 03/18/2024   MCV 90.5 03/18/2024   MCH 31.6 12/07/2015   RDW 14.1 03/18/2024   PLT 183.0 03/18/2024   Lab Results  Component Value Date   IRON 132 09/15/2011   Last metabolic panel Lab Results  Component Value Date   GLUCOSE 121 (H) 03/18/2024   NA 139 03/18/2024   K 4.3 03/18/2024   CL 99 03/18/2024   CO2 32 03/18/2024   BUN 17 03/18/2024   CREATININE 1.20 05/28/2024   GFR 63.54 03/18/2024   CALCIUM 9.7 03/18/2024   PROT 7.3 03/18/2024   ALBUMIN 4.6 03/18/2024   BILITOT 1.3 (H) 03/18/2024   ALKPHOS 49 03/18/2024   AST 21 03/18/2024   ALT 20 03/18/2024   Last lipids Lab Results  Component Value Date   CHOL 194 03/18/2024   HDL 34.70 (L) 03/18/2024   LDLCALC 120 (H) 03/18/2024   LDLDIRECT 129.0 03/24/2022   TRIG 198.0 (H) 03/18/2024   CHOLHDL 6 03/18/2024   Last hemoglobin A1c Lab Results  Component Value Date   HGBA1C 6.0 (A) 06/24/2024   HGBA1C 6.0 06/24/2024   HGBA1C 6.0 06/24/2024   HGBA1C 6.0 06/24/2024   Last thyroid  functions Lab Results  Component Value Date   TSH 1.53 03/12/2023   IMPRESSION AND PLAN:  #1 transient visual disturbance. TIA. He has some mild aortic and carotid atherosclerosis without stenosis. INR has consistently been therapeutic. Will continue him on aspirin 81 daily. He just restarted a statin (pravastatin 40 mg daily) about 1 month ago.  Goal LDL less than 70. Recheck lipid  panel and hepatic panel today. His blood pressure is well-controlled on amlodipine  10 mg a day, bisoprolol -HCTZ 5-6.25 daily, valsartan  320 mg daily, and hydrochlorothiazide  12.5 mg daily. Monitor electrolytes and creatinine today.  He will soon have his first appointment with his [facility] neurologist since having his TIA. He has an appointment with a retinal specialist in January 2026.  An After Visit Summary was printed and given to the patient.  FOLLOW UP: Return in about 3 months (around 09/24/2024) for routine chronic illness f/u.  Signed:  Gerlene Hockey, MD           06/24/2024

## 2024-06-25 ENCOUNTER — Ambulatory Visit: Payer: Self-pay | Admitting: Family Medicine

## 2024-06-25 ENCOUNTER — Other Ambulatory Visit (HOSPITAL_COMMUNITY): Payer: Self-pay

## 2024-06-26 ENCOUNTER — Other Ambulatory Visit (HOSPITAL_COMMUNITY): Payer: Self-pay

## 2024-07-07 DIAGNOSIS — I480 Paroxysmal atrial fibrillation: Secondary | ICD-10-CM | POA: Diagnosis not present

## 2024-07-07 DIAGNOSIS — Z7901 Long term (current) use of anticoagulants: Secondary | ICD-10-CM | POA: Diagnosis not present

## 2024-08-04 DIAGNOSIS — Z7901 Long term (current) use of anticoagulants: Secondary | ICD-10-CM | POA: Diagnosis not present

## 2024-08-04 DIAGNOSIS — I480 Paroxysmal atrial fibrillation: Secondary | ICD-10-CM | POA: Diagnosis not present

## 2024-08-05 DIAGNOSIS — L820 Inflamed seborrheic keratosis: Secondary | ICD-10-CM | POA: Diagnosis not present

## 2024-08-05 DIAGNOSIS — L570 Actinic keratosis: Secondary | ICD-10-CM | POA: Diagnosis not present

## 2024-08-05 DIAGNOSIS — D1801 Hemangioma of skin and subcutaneous tissue: Secondary | ICD-10-CM | POA: Diagnosis not present

## 2024-08-05 DIAGNOSIS — L853 Xerosis cutis: Secondary | ICD-10-CM | POA: Diagnosis not present

## 2024-08-05 DIAGNOSIS — L821 Other seborrheic keratosis: Secondary | ICD-10-CM | POA: Diagnosis not present

## 2024-09-03 ENCOUNTER — Other Ambulatory Visit: Payer: Self-pay | Admitting: Family Medicine

## 2024-09-04 ENCOUNTER — Other Ambulatory Visit (HOSPITAL_COMMUNITY): Payer: Self-pay

## 2024-09-04 ENCOUNTER — Other Ambulatory Visit: Payer: Self-pay

## 2024-09-04 MED ORDER — AMLODIPINE BESYLATE 10 MG PO TABS
10.0000 mg | ORAL_TABLET | Freq: Every day | ORAL | 0 refills | Status: AC
  Filled 2024-09-04: qty 90, 90d supply, fill #0

## 2024-09-04 MED ORDER — OMEPRAZOLE 40 MG PO CPDR
40.0000 mg | DELAYED_RELEASE_CAPSULE | Freq: Every day | ORAL | 0 refills | Status: AC
  Filled 2024-09-04: qty 90, 90d supply, fill #0

## 2024-09-09 ENCOUNTER — Ambulatory Visit: Admitting: Neurology

## 2024-09-23 NOTE — Progress Notes (Unsigned)
 OFFICE VISIT  09/24/2024  CC:  Chief Complaint  Patient presents with   Medical Management of Chronic Issues    Pt is fasting    Patient is a 79 y.o. male who presents for 3 mo f/u HTN, borderline DM, HLD, male hypogonadism, and chronic a-fib with hx of TIA. A/P as of last visit: Transient visual disturbance. TIA. He has some mild aortic and carotid atherosclerosis without stenosis. INR has consistently been therapeutic. Will continue him on aspirin 81 daily. He just restarted a statin (pravastatin  40 mg daily) about 1 month ago.  Goal LDL less than 70. Recheck lipid panel and hepatic panel today. His blood pressure is well-controlled on amlodipine  10 mg a day, bisoprolol -HCTZ 5-6.25 daily, valsartan  320 mg daily, and hydrochlorothiazide  12.5 mg daily. Monitor electrolytes and creatinine today.   He will soon have his first appointment with his [facility] neurologist since having his TIA. He has an appointment with a retinal specialist in January 2026.  INTERIM HX: No visual abnormalities except a chronic left eye floater. Glaucoma specialist eye exam since I last saw him was normal.  He remains on aspirin and statin. He denies any dizziness, palpitations, focal weakness, or falls.  He has chronic bilateral feet neuropathy.  Past Medical History:  Diagnosis Date   BPH (benign prostatic hyperplasia)    Cataract    Right; impairing vision as of 05/2017. (has ophthalmologist)   Chronic renal insufficiency, stage II (mild)    CrCl in the 60s   Coronary artery disease    Diabetes mellitus without complication, without long-term current use of insulin (HCC)    123. A1c was 5.9%. Stable at 6.0% 05/2016. A1c 6.0% 11/2017. 5.9% 08/2019.  Rose to 6.7% Jan 2025   GERD (gastroesophageal reflux disease)    Hearing loss    In a study at [facility] as of 2019   Hx of colonic polyps    03/2017 and 05/2021--adenomatous.  Recall 3 yrs. ([facility] spec)   Hypercholesteremia    Pt refuses statins (as  of 05/2016). Declined again 02/2019.   Hypertension    IBS (irritable bowel syndrome)    OSA on CPAP    Verneita Day, NP at Neurology[facility]Sleep Medicine at [facility]   Peripheral vascular disease    Permanent atrial fibrillation (HCC) 2001   failed DCCV per pt.  Rate control + coumadin --monitored by cardiologist.   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  Colchicine started by UC   Polycythemia    Pt states he had w/u for hemachromatosis but it was NEG.  As of 05/2016, his polycythemia is presumably due to testosterone  effect.   Prostate cancer screening    Pt declines any further prostate ca screening as of 05/2015.   Reactive airway disease    Scoliosis    Testosterone  deficiency    TIA (transient ischemic attack)    Left eye visual disturbance September 2025--> CT angio okay.  Aspirin started.    Past Surgical History:  Procedure Laterality Date   aortic ultrasound  09/2019   AAA screening; prox aorta 3cm->ULN, no aneurism.   CARDIAC CATHETERIZATION  approx 2001   nonobstruc CAD   CIRCUMCISION     COLONOSCOPY W/ POLYPECTOMY  05/12/11; 04/27/17   2018 and 05/2021--adenomatous polyp; repeat 3 yrs   NASAL POLYP SURGERY     SKIN CANCER EXCISION     Non-malanoma   TRANSTHORACIC ECHOCARDIOGRAM  10/2019   2021 EF 55-60%,  nl LV syst fxn, severe LA and RA dilation, valves fine.  03/25/24 EF 55-60%, severe LA dil, RV mod dil, nl RV fxn, mod tricusp regurg, mild AS and mild MR.   UPPER GI ENDOSCOPY  10/07/2021   normal except gastric polyps->Dr. Gregory.  Pt was then changed from protonix  to aciphex    VASECTOMY  1983   VPAP  03/2013   14/4, EEP=11    Outpatient Medications Prior to Visit  Medication Sig Dispense Refill   albuterol  (VENTOLIN  HFA) 108 (90 Base) MCG/ACT inhaler Inhale 2 puffs into the lungs every 4 (four) hours as needed for wheezing or shortness of breath. 6.7 g 2   amLODipine  (NORVASC ) 10 MG tablet Take 1 tablet (10 mg total) by mouth  daily. 90 tablet 0   bisoprolol -hydrochlorothiazide  (ZIAC ) 5-6.25 MG tablet Take 1 tablet by mouth daily. 180 tablet 3   Calcium Carbonate-Vitamin D 600-200 MG-UNIT TABS Take 1 tablet by mouth 2 (two) times daily.     fluticasone  (FLONASE ) 50 MCG/ACT nasal spray Place 2 sprays into both nostrils daily. 16 g 2   hydrochlorothiazide  (MICROZIDE ) 12.5 MG capsule Take 1 capsule (12.5 mg total) by mouth daily. 90 capsule 3   hypromellose (GENTEAL) 0.3 % GEL ophthalmic ointment Place 1 Application into both eyes at bedtime.     ipratropium (ATROVENT ) 0.03 % nasal spray Place 2 sprays into both nostrils every 12 (twelve) hours for 3 days as needed for runny nose 30 mL 1   Multiple Vitamins-Minerals (MENS MULTIVITAMIN PLUS PO) Take 1 tablet by mouth daily.     omeprazole  (PRILOSEC) 40 MG capsule Take 1 capsule (40 mg total) by mouth daily. 90 capsule 0   pravastatin  (PRAVACHOL ) 40 MG tablet Take 1 tablet (40 mg total) by mouth daily.     Testosterone  12.5 MG/ACT (1%) GEL apply 3 pumps onto THE SKIN three DAYS PER WEEK 150 g 5   valsartan  (DIOVAN ) 320 MG tablet Take 1 tablet (320 mg total) by mouth daily. 90 tablet 3   warfarin (COUMADIN ) 2.5 MG tablet Take by mouth.     warfarin (COUMADIN ) 2.5 MG tablet Take up to 1.5 tablets (3.75 mg total) by mouth daily or as directed. 135 tablet 3   No facility-administered medications prior to visit.    Allergies[1]  Review of Systems As per HPI  PE:    09/24/2024    8:44 AM 06/24/2024   10:23 AM 05/27/2024    8:56 AM  Vitals with BMI  Height  5' 5.75 5' 5.75  Weight 215 lbs 13 oz 215 lbs 211 lbs 6 oz  BMI  34.97 34.38  Systolic 100 112 878  Diastolic 61 73 77  Pulse 70 66 74     Physical Exam  Gen: Alert, well appearing.  Patient is oriented to person, place, time, and situation. AFFECT: pleasant, lucid thought and speech. CV: RRR, no m/r/g.   LUNGS: CTA bilat, nonlabored resps, good aeration in all lung fields. EXT: no clubbing or cyanosis.   no edema.    LABS:  Last CBC Lab Results  Component Value Date   WBC 6.0 03/18/2024   HGB 17.3 (H) 03/18/2024   HCT 50.3 03/18/2024   MCV 90.5 03/18/2024   MCH 31.6 12/07/2015   RDW 14.1 03/18/2024   PLT 183.0 03/18/2024   Lab Results  Component Value Date   IRON 132 09/15/2011    Last metabolic panel Lab Results  Component Value Date   GLUCOSE 117 (H) 06/24/2024  NA 139 06/24/2024   K 4.3 06/24/2024   CL 99 06/24/2024   CO2 33 (H) 06/24/2024   BUN 16 06/24/2024   CREATININE 1.02 06/24/2024   GFR 70.20 06/24/2024   CALCIUM 9.3 06/24/2024   PROT 7.1 06/24/2024   ALBUMIN 4.4 06/24/2024   BILITOT 1.0 06/24/2024   ALKPHOS 48 06/24/2024   AST 18 06/24/2024   ALT 19 06/24/2024   Last lipids Lab Results  Component Value Date   CHOL 137 06/24/2024   HDL 39.50 06/24/2024   LDLCALC 62 06/24/2024   LDLDIRECT 129.0 03/24/2022   TRIG 180.0 (H) 06/24/2024   CHOLHDL 3 06/24/2024   Last hemoglobin A1c Lab Results  Component Value Date   HGBA1C 6.0 (A) 09/24/2024   HGBA1C 6.0 09/24/2024   HGBA1C 6.0 09/24/2024   HGBA1C 6.0 09/24/2024   Lab Results  Component Value Date   TESTOSTERONE  293.47 (L) 03/18/2024   IMPRESSION AND PLAN:  #1 borderline diabetes.  No medications. POC Hba1c today is 6.0%. Renal function and urine microalbumin/creatinine today.  2.  Hypertension, well-controlled on amlodipine  10 mg a day, bisoprolol -HCTZ 5-6.25, 1 tab daily, HCTZ 12.5 mg a day, and valsartan  320 milligrams a day. Monitor electrolytes and creatinine today.  3.  History of TIA. No new symptoms.  Continue aspirin 81 mg a day and pravastatin  40 mg a day. Monitor lipid panel today.  4.  Secondary male hypogonadism. Continue testosterone  gel 12.5 mg per actuation, 3 pumps 3 days/week. Monitor hemoglobin and testosterone  level today (most recent application was 1 week ago).  An After Visit Summary was printed and given to the patient.  FOLLOW UP: Return in about 6 months  (around 03/24/2025) for annual CPE (fasting). Next cpe 02/2025 Signed:  Gerlene Hockey, MD           09/24/2024     [1]  Allergies Allergen Reactions   Milk (Cow) Other (See Comments)    Causes reflux if consumed in the pm.   Atorvastatin     REACTION: INTOL to Lipitor   Pollen Extract Other (See Comments)    Stuffy nose

## 2024-09-24 ENCOUNTER — Ambulatory Visit: Admitting: Family Medicine

## 2024-09-24 ENCOUNTER — Encounter: Payer: Self-pay | Admitting: Family Medicine

## 2024-09-24 VITALS — BP 100/61 | HR 70 | Temp 97.0°F | Wt 215.8 lb

## 2024-09-24 DIAGNOSIS — Z8673 Personal history of transient ischemic attack (TIA), and cerebral infarction without residual deficits: Secondary | ICD-10-CM | POA: Diagnosis not present

## 2024-09-24 DIAGNOSIS — D751 Secondary polycythemia: Secondary | ICD-10-CM | POA: Diagnosis not present

## 2024-09-24 DIAGNOSIS — R7303 Prediabetes: Secondary | ICD-10-CM | POA: Diagnosis not present

## 2024-09-24 DIAGNOSIS — E291 Testicular hypofunction: Secondary | ICD-10-CM

## 2024-09-24 DIAGNOSIS — I10 Essential (primary) hypertension: Secondary | ICD-10-CM

## 2024-09-24 LAB — CBC
HCT: 50.9 % (ref 39.0–52.0)
Hemoglobin: 17.5 g/dL — ABNORMAL HIGH (ref 13.0–17.0)
MCHC: 34.3 g/dL (ref 30.0–36.0)
MCV: 93 fl (ref 78.0–100.0)
Platelets: 196 10*3/uL (ref 150.0–400.0)
RBC: 5.47 Mil/uL (ref 4.22–5.81)
RDW: 14 % (ref 11.5–15.5)
WBC: 7.6 10*3/uL (ref 4.0–10.5)

## 2024-09-24 LAB — LIPID PANEL
Cholesterol: 144 mg/dL (ref 28–200)
HDL: 38.7 mg/dL — ABNORMAL LOW
LDL Cholesterol: 59 mg/dL (ref 10–99)
NonHDL: 105.1
Total CHOL/HDL Ratio: 4
Triglycerides: 231 mg/dL — ABNORMAL HIGH (ref 10.0–149.0)
VLDL: 46.2 mg/dL — ABNORMAL HIGH (ref 0.0–40.0)

## 2024-09-24 LAB — POCT GLYCOSYLATED HEMOGLOBIN (HGB A1C)
HbA1c POC (<> result, manual entry): 6 %
HbA1c, POC (controlled diabetic range): 6 % (ref 0.0–7.0)
HbA1c, POC (prediabetic range): 6 % (ref 5.7–6.4)
Hemoglobin A1C: 6 % — AB (ref 4.0–5.6)

## 2024-09-24 LAB — BASIC METABOLIC PANEL WITH GFR
BUN: 22 mg/dL (ref 6–23)
CO2: 31 meq/L (ref 19–32)
Calcium: 9.8 mg/dL (ref 8.4–10.5)
Chloride: 99 meq/L (ref 96–112)
Creatinine, Ser: 1.19 mg/dL (ref 0.40–1.50)
GFR: 58.24 mL/min — ABNORMAL LOW
Glucose, Bld: 128 mg/dL — ABNORMAL HIGH (ref 70–99)
Potassium: 4.1 meq/L (ref 3.5–5.1)
Sodium: 139 meq/L (ref 135–145)

## 2024-09-24 LAB — MICROALBUMIN / CREATININE URINE RATIO
Creatinine,U: 156.8 mg/dL
Microalb Creat Ratio: 8.8 mg/g (ref 0.0–30.0)
Microalb, Ur: 1.4 mg/dL (ref 0.7–1.9)

## 2024-09-25 ENCOUNTER — Ambulatory Visit: Payer: Self-pay | Admitting: Family Medicine

## 2024-09-25 LAB — TESTOSTERONE: Testosterone: 270.86 ng/dL — ABNORMAL LOW (ref 300.00–890.00)

## 2024-09-29 ENCOUNTER — Other Ambulatory Visit: Payer: Self-pay | Admitting: Family Medicine

## 2024-09-30 ENCOUNTER — Other Ambulatory Visit (HOSPITAL_COMMUNITY): Payer: Self-pay

## 2024-09-30 ENCOUNTER — Other Ambulatory Visit: Payer: Self-pay

## 2024-09-30 MED ORDER — HYDROCHLOROTHIAZIDE 12.5 MG PO CAPS
12.5000 mg | ORAL_CAPSULE | Freq: Every day | ORAL | 3 refills | Status: AC
  Filled 2024-09-30: qty 90, 90d supply, fill #0

## 2025-03-04 ENCOUNTER — Encounter

## 2025-03-24 ENCOUNTER — Ambulatory Visit: Admitting: Family Medicine
